# Patient Record
Sex: Male | Born: 1955 | Race: White | Hispanic: No | Marital: Married | Smoking: Former smoker
Health system: Southern US, Community
[De-identification: ages and names within clinical notes are randomized; demographics above are authoritative.]

## PROBLEM LIST (undated history)

## (undated) DIAGNOSIS — K219 Gastro-esophageal reflux disease without esophagitis: Secondary | ICD-10-CM

## (undated) DIAGNOSIS — I959 Hypotension, unspecified: Secondary | ICD-10-CM

## (undated) DIAGNOSIS — R55 Syncope and collapse: Secondary | ICD-10-CM

## (undated) DIAGNOSIS — Z950 Presence of cardiac pacemaker: Secondary | ICD-10-CM

## (undated) DIAGNOSIS — K2270 Barrett's esophagus without dysplasia: Secondary | ICD-10-CM

## (undated) DIAGNOSIS — T7840XA Allergy, unspecified, initial encounter: Secondary | ICD-10-CM

## (undated) DIAGNOSIS — E7800 Pure hypercholesterolemia, unspecified: Secondary | ICD-10-CM

## (undated) DIAGNOSIS — M1990 Unspecified osteoarthritis, unspecified site: Secondary | ICD-10-CM

## (undated) HISTORY — PX: POLYPECTOMY: SHX149

## (undated) HISTORY — DX: Hypotension, unspecified: I95.9

## (undated) HISTORY — DX: Unspecified osteoarthritis, unspecified site: M19.90

## (undated) HISTORY — DX: Gastro-esophageal reflux disease without esophagitis: K21.9

## (undated) HISTORY — PX: COLONOSCOPY: SHX174

## (undated) HISTORY — PX: BLADDER SURGERY: SHX569

## (undated) HISTORY — PX: WISDOM TOOTH EXTRACTION: SHX21

## (undated) HISTORY — PX: UPPER GASTROINTESTINAL ENDOSCOPY: SHX188

## (undated) HISTORY — DX: Barrett's esophagus without dysplasia: K22.70

## (undated) HISTORY — PX: CERVICAL SPINE SURGERY: SHX589

## (undated) HISTORY — PX: OTHER SURGICAL HISTORY: SHX169

## (undated) HISTORY — DX: Syncope and collapse: R55

## (undated) HISTORY — PX: TONSILLECTOMY: SUR1361

## (undated) HISTORY — DX: Allergy, unspecified, initial encounter: T78.40XA

---

## 2003-10-23 ENCOUNTER — Other Ambulatory Visit: Admission: RE | Admit: 2003-10-23 | Discharge: 2003-10-23 | Payer: Self-pay | Admitting: Dermatology

## 2005-11-12 ENCOUNTER — Emergency Department (HOSPITAL_COMMUNITY): Admission: EM | Admit: 2005-11-12 | Discharge: 2005-11-12 | Payer: Self-pay | Admitting: Emergency Medicine

## 2005-11-29 ENCOUNTER — Observation Stay (HOSPITAL_COMMUNITY): Admission: RE | Admit: 2005-11-29 | Discharge: 2005-11-30 | Payer: Self-pay | Admitting: Neurosurgery

## 2006-09-15 ENCOUNTER — Observation Stay (HOSPITAL_COMMUNITY): Admission: EM | Admit: 2006-09-15 | Discharge: 2006-09-16 | Payer: Self-pay | Admitting: Emergency Medicine

## 2006-09-15 HISTORY — PX: CARDIAC CATHETERIZATION: SHX172

## 2006-10-02 HISTORY — PX: OTHER SURGICAL HISTORY: SHX169

## 2009-11-05 ENCOUNTER — Encounter (INDEPENDENT_AMBULATORY_CARE_PROVIDER_SITE_OTHER): Payer: Self-pay | Admitting: *Deleted

## 2009-11-09 ENCOUNTER — Ambulatory Visit: Payer: Self-pay | Admitting: Gastroenterology

## 2009-11-09 ENCOUNTER — Encounter (INDEPENDENT_AMBULATORY_CARE_PROVIDER_SITE_OTHER): Payer: Self-pay | Admitting: *Deleted

## 2009-11-16 ENCOUNTER — Ambulatory Visit: Payer: Self-pay | Admitting: Gastroenterology

## 2009-11-18 ENCOUNTER — Encounter: Payer: Self-pay | Admitting: Gastroenterology

## 2010-01-28 ENCOUNTER — Ambulatory Visit: Payer: Self-pay | Admitting: Otolaryngology

## 2010-02-12 ENCOUNTER — Ambulatory Visit (HOSPITAL_COMMUNITY)
Admission: RE | Admit: 2010-02-12 | Discharge: 2010-02-12 | Payer: Self-pay | Source: Home / Self Care | Attending: Otolaryngology | Admitting: Otolaryngology

## 2010-03-09 NOTE — Letter (Signed)
Summary: [facility] Instructions  [facility]  [address] [place], [place] [address]   Phone: [phone]  Fax: [phone]       [name]    Mar 15, 1955    MRN: [id number]        Procedure Day /Date:  Monday 11/16/2009     Arrival Time:  8:00 am      Procedure Time: 9:00 am     Location of Procedure:                    _ x_  [facility] (4th Floor)                        PREPARATION FOR COLONOSCOPY WITH MOVIPREP   Starting 5 days prior to your procedure Wednesday 10/5 do not eat nuts, seeds, popcorn, corn, beans, peas,  salads, or any raw vegetables.  Do not take any fiber supplements (e.g. Metamucil, Citrucel, and Benefiber).  THE DAY BEFORE YOUR PROCEDURE         DATE: Sunday 10/9  1.  Drink clear liquids the entire day-NO SOLID FOOD  2.  Do not drink anything colored red or purple.  Avoid juices with pulp.  No orange juice.  3.  Drink at least 64 oz. (8 glasses) of fluid/clear liquids during the day to prevent dehydration and help the prep work efficiently.  CLEAR LIQUIDS INCLUDE: Water Jello Ice Popsicles Tea (sugar ok, no milk/cream) Powdered fruit flavored drinks Coffee (sugar ok, no milk/cream) Gatorade Juice: apple, white grape, white cranberry  Lemonade Clear bullion, consomm, broth Carbonated beverages (any kind) Strained chicken noodle soup Hard Candy                             4.  In the morning, mix first dose of MoviPrep solution:    Empty 1 Pouch A and 1 Pouch B into the disposable container    Add lukewarm drinking water to the top line of the container. Mix to dissolve    Refrigerate (mixed solution should be used within 24 hrs)  5.  Begin drinking the prep at 5:00 p.m. The MoviPrep container is divided by 4 marks.   Every 15 minutes drink the solution down to the next mark (approximately 8 oz) until the full liter is complete.   6.  Follow completed prep with 16 oz of clear liquid of your choice (Nothing  red or purple).  Continue to drink clear liquids until bedtime.  7.  Before going to bed, mix second dose of MoviPrep solution:    Empty 1 Pouch A and 1 Pouch B into the disposable container    Add lukewarm drinking water to the top line of the container. Mix to dissolve    Refrigerate  THE DAY OF YOUR PROCEDURE      DATE: Monday 10/10  Beginning at 4:00 a.m. (5 hours before procedure):         1. Every 15 minutes, drink the solution down to the next mark (approx 8 oz) until the full liter is complete.  2. Follow completed prep with 16 oz. of clear liquid of your choice.    3. You may drink clear liquids until 7:00 am (2 HOURS BEFORE PROCEDURE).   MEDICATION INSTRUCTIONS  Unless otherwise instructed, you should take regular prescription medications with a small sip of water   as early as possible the morning  of your procedure.           OTHER INSTRUCTIONS  You will need a responsible adult at least 53 years of age to accompany you and drive you home.   This person must remain in the waiting room during your procedure.  Wear loose fitting clothing that is easily removed.  Leave jewelry and other valuables at home.  However, you may wish to bring a book to read or  an iPod/MP3 player to listen to music as you wait for your procedure to start.  Remove all body piercing jewelry and leave at home.  Total time from sign-in until discharge is approximately 2-3 hours.  You should go home directly after your procedure and rest.  You can resume normal activities the  day after your procedure.  The day of your procedure you should not:   Drive   Make legal decisions   Operate machinery   Drink alcohol   Return to work  You will receive specific instructions about eating, activities and medications before you leave.    The above instructions have been reviewed and explained to me by   Ezra Sites RN  November 09, 2009 1:07 PM     I fully understand and can  verbalize these instructions _____________________________ Date _________

## 2010-03-09 NOTE — Procedures (Signed)
Summary: Colonoscopy  Patient: [name] Note: All result statuses are Final unless otherwise noted.  Tests: (1) Colonoscopy (COL)   COL Colonoscopy           DONE     [facility]     [address]     [place], [place]  [address]           COLONOSCOPY PROCEDURE REPORT           PATIENT:  [name], [name]  MR#:  [id number]     BIRTHDATE:  12/21/1955, 54 yrs. old  GENDER:  male     ENDOSCOPIST:  Rachael Fee, MD     REF. BY:  Lilyan Punt, M.D.     PROCEDURE DATE:  11/16/2009     PROCEDURE:  Colonoscopy with snare polypectomy     ASA CLASS:  Class II     INDICATIONS:  Routine Risk Screening     MEDICATIONS:   Fentanyl 50 mcg IV, Versed 6 mg IV           DESCRIPTION OF PROCEDURE:   After the risks benefits and     alternatives of the procedure were thoroughly explained, informed     consent was obtained.  Digital rectal exam was performed and     revealed no rectal masses.   The LB [id number]-[id number] endoscope     was introduced through the anus and advanced to the terminal ileum     which was intubated for a short distance, without limitations.     The quality of the prep was excellent, using MoviPrep.  The     instrument was then slowly withdrawn as the colon was fully     examined.     <<PROCEDUREIMAGES>>           FINDINGS:  A diminutive polyp was found in the ascending colon.     This was 2-48mm across, removed with cold snare and sent to     pathology (jar 1) (see image4).  Mild diverticulosis was found in     the sigmoid to descending colon segments (see image5).  The     terminal ileum appeared normal (see image3).  This was otherwise a     normal examination of the colon (see image2, image1, and image6).     Retroflexed views in the rectum revealed no abnormalities.    The     scope was then withdrawn from the patient and the procedure     completed.           COMPLICATIONS:  None     ENDOSCOPIC IMPRESSION:     1) Diminutive polyp in the ascending  colon, removed and sent to     pathology     2) Mild diverticulosis in the sigmoid to descending colon     segments     3) Normal terminal ileum     4) Otherwise normal examination           RECOMMENDATIONS:     1) If the polyp(s) removed today are proven to be adenomatous     (pre-cancerous) polyps, you will need a repeat colonoscopy in 5     years. Otherwise you should continue to follow colorectal cancer     screening guidelines for "routine risk" patients with colonoscopy     in 10 years.     2) You will receive a letter within 1-2 weeks with the results     of your biopsy  as well as final recommendations. Please call my     office if you have not received a letter after 3 weeks.           ______________________________     Rachael Fee, MD           n.     eSIGNED:   Rachael Fee at 11/16/2009 09:26 AM           Dahlia Bailiff, [phone]  Note: An exclamation mark (!) indicates a result that was not dispersed into the flowsheet. Document Creation Date: 11/16/2009 9:27 AM _______________________________________________________________________  (1) Order result status: Final Collection or observation date-time: 11/16/2009 09:22 Requested date-time:  Receipt date-time:  Reported date-time:  Referring Physician:   Ordering Physician: Rob Bunting [id number]) Specimen Source:  Source: Launa Grill Order Number: [id number] Lab site:   Appended Document: Colonoscopy     Procedures Next Due Date:    Colonoscopy: 11/2019

## 2010-03-09 NOTE — Letter (Signed)
Summary: Results Letter  [facility]  [address] [place], [place] [address]   Phone: [phone]  Fax: [phone]        November 18, 2009 MRN: [id number]    [facility] [address] [place], [place]  [address]    Dear Mr. Gaiser,   Good news.  The polyp that was removed during your recent procedure was NOT pre-cancerous.  You should continue to follow current colorectal cancer screening guidelines with a repeat colonoscopy in 10 years.  We will therefore put your information in our reminder system and will contact you in 10 years to schedule a repeat procedure.  Please call if you have any questions or concerns.       Sincerely,  Rachael Fee MD  This letter has been electronically signed by your physician.  Appended Document: Results Letter letter mailed

## 2010-03-09 NOTE — Letter (Signed)
Summary: Pre Visit Letter Revised  [facility]  [address] [place], [place] [address]   Phone: [phone]  Fax: [phone]        11/05/2009 MRN: [id number] [facility] [address] [place], [place]  [address]             Procedure Date: 11-30-09   Welcome to the Gastroenterology Division at [facility].    You are scheduled to see a nurse for your pre-procedure visit on 11-16-09 at 3:30p.m. on the 3rd floor at [facility], [address]  We ask that you try to arrive at our office 15 minutes prior to your appointment time to allow for check-in.  Please take a minute to review the attached form.  If you answer "Yes" to one or more of the questions on the first page, we ask that you call the person listed at your earliest opportunity.  If you answer "No" to all of the questions, please complete the rest of the form and bring it to your appointment.    Your nurse visit will consist of discussing your medical and surgical history, your immediate family medical history, and your medications.   If you are unable to list all of your medications on the form, please bring the medication bottles to your appointment and we will list them.  We will need to be aware of both prescribed and over the counter drugs.  We will need to know exact dosage information as well.    Please be prepared to read and sign documents such as consent forms, a financial agreement, and acknowledgement forms.  If necessary, and with your consent, a friend or relative is welcome to sit-in on the nurse visit with you.  Please bring your insurance card so that we may make a copy of it.  If your insurance requires a referral to see a specialist, please bring your referral form from your primary care physician.  No co-pay is required for this nurse visit.     If you cannot keep your appointment, please call [phone] to cancel or reschedule prior to your appointment date.  This  allows [place] the opportunity to schedule an appointment for another patient in need of care.    Thank you for choosing [facility] for your medical needs.  We appreciate the opportunity to care for you.  Please visit [place] at our website  to learn more about our practice.  Sincerely, The Gastroenterology Division

## 2010-03-09 NOTE — Miscellaneous (Signed)
Summary: [facility]  Clinical Lists Changes  Medications: Added new medication of MOVIPREP 100 GM  SOLR (PEG-KCL-NACL-NASULF-NA ASC-C) As per prep instructions. - Signed Rx of MOVIPREP 100 GM  SOLR (PEG-KCL-NACL-NASULF-NA ASC-C) As per prep instructions.;  #1 x 0;  Signed;  Entered by: Ezra Sites RN;  Authorized by: Rachael Fee MD;  Method used: Electronically to [facility] [phone]*, [address] [place], [place], [place]  [address], Ph: [phone] or [phone], Fax: [phone] Allergies: Added new allergy or adverse reaction of LEVAQUIN Added new allergy or adverse reaction of SULFA Added new allergy or adverse reaction of NORVASC Observations: Added new observation of NKA: F (11/09/2009 12:48)    Prescriptions: MOVIPREP 100 GM  SOLR (PEG-KCL-NACL-NASULF-NA ASC-C) As per prep instructions.  #1 x 0   Entered by:   Ezra Sites RN   Authorized by:   Rachael Fee MD   Signed by:   Ezra Sites RN on 11/09/2009   Method used:   Electronically to        [facility] [phone]* (retail)       [address]       [place], [place]  [address]       Ph: [phone] or [phone]       Fax: [phone]   RxID:   [id number]

## 2010-06-06 ENCOUNTER — Emergency Department (HOSPITAL_COMMUNITY)
Admission: EM | Admit: 2010-06-06 | Discharge: 2010-06-07 | Disposition: A | Discharge status: Home / Self Care | Payer: Managed Care, Other (non HMO) | Source: Home / Self Care | Attending: Emergency Medicine | Admitting: Emergency Medicine

## 2010-06-06 ENCOUNTER — Emergency Department (HOSPITAL_COMMUNITY): Payer: Managed Care, Other (non HMO)

## 2010-06-06 DIAGNOSIS — M1990 Unspecified osteoarthritis, unspecified site: Secondary | ICD-10-CM | POA: Insufficient documentation

## 2010-06-06 DIAGNOSIS — R079 Chest pain, unspecified: Secondary | ICD-10-CM | POA: Insufficient documentation

## 2010-06-06 DIAGNOSIS — I10 Essential (primary) hypertension: Secondary | ICD-10-CM | POA: Insufficient documentation

## 2010-06-06 DIAGNOSIS — I498 Other specified cardiac arrhythmias: Secondary | ICD-10-CM | POA: Insufficient documentation

## 2010-06-06 DIAGNOSIS — E7800 Pure hypercholesterolemia, unspecified: Secondary | ICD-10-CM | POA: Insufficient documentation

## 2010-06-06 DIAGNOSIS — Z79899 Other long term (current) drug therapy: Secondary | ICD-10-CM | POA: Insufficient documentation

## 2010-06-06 DIAGNOSIS — K219 Gastro-esophageal reflux disease without esophagitis: Secondary | ICD-10-CM | POA: Insufficient documentation

## 2010-06-06 LAB — CBC
Hemoglobin: 14.6 g/dL (ref 13.0–17.0)
MCH: 32.1 pg (ref 26.0–34.0)
MCHC: 35.5 g/dL (ref 30.0–36.0)
Platelets: 155 10*3/uL (ref 150–400)

## 2010-06-06 LAB — DIFFERENTIAL
Basophils Absolute: 0 10*3/uL (ref 0.0–0.1)
Basophils Relative: 0 % (ref 0–1)
Eosinophils Absolute: 0.1 10*3/uL (ref 0.0–0.7)
Monocytes Absolute: 0.4 10*3/uL (ref 0.1–1.0)
Neutro Abs: 4 10*3/uL (ref 1.7–7.7)
Neutrophils Relative %: 52 % (ref 43–77)

## 2010-06-06 LAB — BASIC METABOLIC PANEL
CO2: 23 mEq/L (ref 19–32)
Calcium: 9.6 mg/dL (ref 8.4–10.5)
Creatinine, Ser: 1.08 mg/dL (ref 0.4–1.5)
GFR calc Af Amer: >60 mL/min (ref >60)
GFR calc non Af Amer: >60 mL/min (ref >60)
Sodium: 139 mEq/L (ref 135–145)

## 2010-06-06 LAB — POCT CARDIAC MARKERS
CKMB, poc: 2.3 ng/mL (ref 1.0–8.0)
Myoglobin, poc: 121 ng/mL (ref 12–200)

## 2010-06-07 ENCOUNTER — Inpatient Hospital Stay (HOSPITAL_COMMUNITY)
Admission: EM | Admit: 2010-06-07 | Discharge: 2010-06-09 | DRG: 244 | Disposition: A | Discharge status: Home / Self Care | Payer: Managed Care, Other (non HMO) | Source: Other Acute Inpatient Hospital | Attending: Cardiovascular Disease | Admitting: Cardiovascular Disease

## 2010-06-07 DIAGNOSIS — I495 Sick sinus syndrome: Principal | ICD-10-CM | POA: Diagnosis present

## 2010-06-07 DIAGNOSIS — E785 Hyperlipidemia, unspecified: Secondary | ICD-10-CM | POA: Diagnosis present

## 2010-06-07 DIAGNOSIS — R5381 Other malaise: Secondary | ICD-10-CM | POA: Diagnosis present

## 2010-06-07 DIAGNOSIS — I2510 Atherosclerotic heart disease of native coronary artery without angina pectoris: Secondary | ICD-10-CM | POA: Diagnosis present

## 2010-06-07 LAB — CBC
HCT: 35.8 % — ABNORMAL LOW (ref 39.0–52.0)
MCH: 32.3 pg (ref 26.0–34.0)
MCHC: 36 g/dL (ref 30.0–36.0)
MCV: 89.5 fL (ref 78.0–100.0)
Platelets: 142 10*3/uL — ABNORMAL LOW (ref 150–400)
RDW: 12.6 % (ref 11.5–15.5)
WBC: 7.7 10*3/uL (ref 4.0–10.5)

## 2010-06-07 LAB — COMPREHENSIVE METABOLIC PANEL
ALT: 19 U/L (ref 0–53)
Alkaline Phosphatase: 50 U/L (ref 39–117)
BUN: 19 mg/dL (ref 6–23)
CO2: 25 mEq/L (ref 19–32)
Chloride: 109 mEq/L (ref 96–112)
Glucose, Bld: 100 mg/dL — ABNORMAL HIGH (ref 70–99)
Potassium: 4.4 mEq/L (ref 3.5–5.1)
Sodium: 139 mEq/L (ref 135–145)
Total Bilirubin: 0.7 mg/dL (ref 0.3–1.2)
Total Protein: 6 g/dL (ref 6.0–8.3)

## 2010-06-07 LAB — POCT CARDIAC MARKERS
CKMB, poc: 3.3 ng/mL (ref 1.0–8.0)
Myoglobin, poc: 75.6 ng/mL (ref 12–200)

## 2010-06-07 LAB — MAGNESIUM: Magnesium: 2.2 mg/dL (ref 1.5–2.5)

## 2010-06-07 LAB — CARDIAC PANEL(CRET KIN+CKTOT+MB+TROPI)
CK, MB: 3.6 ng/mL (ref 0.3–4.0)
Relative Index: 1.7 (ref 0.0–2.5)
Relative Index: 2.1 (ref 0.0–2.5)
Total CK: 195 U/L (ref 7–232)
Total CK: 152 U/L (ref 7–232)
Troponin I: 0.02 ng/mL (ref 0.00–0.06)
Troponin I: 0.01 ng/mL (ref 0.00–0.06)

## 2010-06-07 LAB — HEMOGLOBIN A1C: Hgb A1c MFr Bld: 5.1 % (ref <5.7)

## 2010-06-07 LAB — TSH: TSH: 1.086 u[IU]/mL (ref 0.350–4.500)

## 2010-06-07 LAB — PROTIME-INR: Prothrombin Time: 13.8 seconds (ref 11.6–15.2)

## 2010-06-07 LAB — HEPARIN LEVEL (UNFRACTIONATED)
Heparin Unfractionated: 0.64 IU/mL (ref 0.30–0.70)
Heparin Unfractionated: <0.1 IU/mL — ABNORMAL LOW (ref 0.30–0.70)

## 2010-06-08 HISTORY — PX: PACEMAKER INSERTION: SHX728

## 2010-06-08 LAB — BASIC METABOLIC PANEL
BUN: 15 mg/dL (ref 6–23)
Chloride: 109 mEq/L (ref 96–112)
GFR calc non Af Amer: >60 mL/min (ref >60)
Potassium: 4.1 mEq/L (ref 3.5–5.1)
Sodium: 141 mEq/L (ref 135–145)

## 2010-06-08 LAB — CBC
HCT: 37.1 % — ABNORMAL LOW (ref 39.0–52.0)
MCV: 91.2 fL (ref 78.0–100.0)
Platelets: 125 10*3/uL — ABNORMAL LOW (ref 150–400)
RBC: 4.07 MIL/uL — ABNORMAL LOW (ref 4.22–5.81)
RDW: 12.7 % (ref 11.5–15.5)
WBC: 6.3 10*3/uL (ref 4.0–10.5)

## 2010-06-09 ENCOUNTER — Inpatient Hospital Stay (HOSPITAL_COMMUNITY): Payer: Managed Care, Other (non HMO)

## 2010-06-17 NOTE — H&P (Signed)
[name], [name]                ACCOUNT NO.:  [id number]  MEDICAL RECORD NO.:  [id number]           PATIENT TYPE:  I  LOCATION:  2919                         FACILITY:  [facility]  PHYSICIAN:  Thurmon Fair, MD     DATE OF BIRTH:  05-May-1955  DATE OF ADMISSION:  06/07/2010 DATE OF DISCHARGE:                             HISTORY & PHYSICAL   CHIEF COMPLAINT:  Chest pain.  HISTORY OF PRESENT ILLNESS:  [name] is a very pleasant 55 year old white male with a history of hypertension, dyslipidemia, and gastroesophageal reflux disease who presents to the emergency department at [facility] with complaints of chest pain.  He states that over the last several weeks he had been experiencing some intermittent episodes of left anterior chest pain, which has been very short lived not particularly coming on with exertion or activity and lasting only a few seconds and abating spontaneously.  However, yesterday afternoon, he developed this chest discomfort, which persisted for approximately 20-30 minutes.  He presented for evaluation, was given nitrate and morphine with very little improvement of his discomfort.  He was subsequently started on IV nitroglycerin and continued with morphine and Dilaudid for pain.  He reports that he has experienced some shortness of breath associated with this discomfort and has noted some progressive dyspnea on exertion for the last several months.  He has not experienced any orthopnea, PND, or lower extremity edema.  He has had an episode last week where while he was working he experienced some lightheadedness and presyncope.  He thought he would pass out and then his symptoms improved, and he was able to carry on his activities.  He denied any associated nausea or diaphoresis with this chest pain or with this episode of presyncope.  On arrival from [facility], his EKG reveals marked sinus bradycardia with a ventricular rate of 33 beats  per minute; however, there are no acute ischemic changes noted.  His point- of-care markers at [facility] have been negative.  Currently, he is pain- free.  PAST MEDICAL HISTORY: 1. Chest pain.     a.     Cardiac catheterization in 2008 revealed normal coronary      arteries. 2. Normal LV function. 3. Hypertension. 4. Dyslipidemia. 5. GERD. 6. Arthritis. 7. Degenerative joint disease status post neck surgery.  FAMILY HISTORY:  Positive for coronary artery disease.  His father had his first MI in his 71s.  SOCIAL HISTORY:  He is married.  Denies any tobacco or alcohol.  No drug use.  ALLERGIES:  LEVAQUIN causes hives, NORVASC and SULFA causes difficulty breathing.  CURRENT MEDICATIONS:  Omeprazole, niacin, and fish oil.  REVIEW OF SYSTEMS:  As per HPI, otherwise negative.  PHYSICAL EXAMINATION:  VITAL SIGNS:  Blood pressure is 108/74, pulse is 34 and regular, respirations 18, pulse ox is 100% on 2 liters. GENERAL:  This is a pleasant 55 year old white male in no acute distress. HEENT:  Pupils are equal and reactive to light and accommodation. Extraocular movements are intact. NECK:  Supple.  No JVD.  No carotid bruits or thyromegaly. CARDIOVASCULAR:  Regular rate and  rhythm.  S1 and S2.  He is bradycardic.  No murmur, gallop, rub. LUNGS:  Clear to auscultation bilaterally with normal respiratory effort. ABDOMEN:  Soft, nontender with hepatosplenomegaly or masses. EXTREMITIES:  Radial, femoral, dorsal pedal arteries present without lower extremity edema.  No clubbing, cyanosis, or ulcers. NEUROLOGIC:  Oriented to person, place, and time.  Normal mood and affect.  LABORATORY DATA:  EKG reveals sinus bradycardia.  BMET is normal with a glucose of 111, BUN 19, creatinine 1.08.  CBC is normal.  Point-of-care markers are negative.  Chest x-ray reveals mild cardiomegaly, unchanged to prior exam.  No acute cardiopulmonary disease.  IMPRESSION: 1. Chest pain. 2. Normal  coronary arteries in 2008. 3. Hypertension. 4. Dyslipidemia. 5. Bradycardia. 6. Gastroesophageal reflux disease. 7. Degenerative joint disease. 8. Arthritis.  PLAN:  We will keep him n.p.o. this morning for possible cardiac catheterization.  We will check a TSH, hemoglobin A1c, magnesium, and continue to cycle his enzymes.  His IV nitro is off currently, and he is comfortable, but we will restart that with any recurrent chest discomfort.  We will hold any beta-blocker therapy as he is significantly bradycardic.  We will place a Zoll at the bedside and atropine as well for any symptomatic bradycardia that may arise.    ______________________________ Rea [employer], NP   ______________________________ Thurmon Fair, MD    LS/MEDQ  D:  06/07/2010  T:  06/07/2010  Job:  [id number]  cc:   Lorin Picket A. Gerda Diss, MD [facility]  Electronically Signed by Charmian Muff NP on 06/10/2010 05:27:36 PM Electronically Signed by Thurmon Fair M.D. on 06/17/2010 04:07:47 PM

## 2010-06-17 NOTE — Discharge Summary (Signed)
  NAMESTERLING, [name]                ACCOUNT NO.:  [id number]  MEDICAL RECORD NO.:  [id number]           PATIENT TYPE:  I  LOCATION:  2006                         FACILITY:  [facility]  PHYSICIAN:  Thurmon Fair, MD     DATE OF BIRTH:  06/14/1955  DATE OF ADMISSION:  06/07/2010 DATE OF DISCHARGE:  06/09/2010                              DISCHARGE SUMMARY   DISCHARGE DIAGNOSES: 1. Chest pain, minor coronary artery disease at catheterization, this     is the patient's second catheterization. 2. Sinus bradycardia. 3. Fatigue, status post pacemaker implant this admission. 4. Treated dyslipidemia.  HOSPITAL COURSE:  The patient is a 55 year old male with dyslipidemia who has had previous catheterization in 2008 showing no significant coronary artery disease.  He presented with chest pain worrisome for unstable angina.  His last catheterization was in 2008.  He is a nonsmoker, he does have a family history of coronary artery disease with his father having an MI in his 75s.  He was admitted to telemetry and ruled out for an MI.  He was set up for diagnostic catheterization showing minor coronary artery disease with a less than 20% distal RCA narrowing.  EF was 55%.  The patient did have documented bradycardia during his admission with rates in the low 30s.  He admits to some fatigue.  He underwent elective pacemaker implant on Jun 08, 2010, by Dr. Royann Shivers, he has a Medtronic device in place.  Chest x-ray shows no pneumothorax.  His device function is normal at discharge.  We feel that he can be discharged and can follow up with Dr. Royann Shivers in a week in [place].  LABORATORY DATA:  TSH is 1.08.  CK-MB and troponins were negative x3. Hemoglobin A1c is 5.1.  Sodium 139, potassium 4.4, BUN 19, creatinine 1.04.  Liver functions were normal.  INR 1.04, white count 7.7, hemoglobin 12.9, hematocrit 35.8, platelets 142.  An EKG shows sinus bradycardia with low rate of 33.  DISPOSITION:  The  patient is discharged in stable condition.  He is paced at discharge.  He will follow up with Dr. Royann Shivers in [place]. Please see med rec for complete discharge medications.     Abelino Derrick, P.A.   ______________________________ Thurmon Fair, MD    LKK/MEDQ  D:  06/09/2010  T:  06/09/2010  Job:  [id number]  cc:   Lorin Picket A. Gerda Diss, MD  Electronically Signed by Corine Shelter P.A. on 06/11/2010 05:31:11 PM Electronically Signed by Thurmon Fair M.D. on 06/17/2010 04:07:52 PM

## 2010-06-17 NOTE — Op Note (Signed)
[name], [name]                ACCOUNT NO.:  [id number]  MEDICAL RECORD NO.:  [id number]           PATIENT TYPE:  I  LOCATION:  2006                         FACILITY:  [facility]  PHYSICIAN:  Thurmon Fair, MD     DATE OF BIRTH:  05/11/1955  DATE OF PROCEDURE: DATE OF DISCHARGE:                              OPERATIVE REPORT   PROCEDURES PERFORMED: 1. Implantation of new dual-chamber permanent pacemaker. 2. Fluoroscopy. 3. Moderate sedation.  REASON FOR THE PROCEDURE:  Symptomatic bradycardia with sinus node dysfunction.  OPERATOR:  Thurmon Fair, MD  ASSISTANT:  Oliver Hum, RCIS  COMPLICATIONS:  None.  ESTIMATED BLOOD LOSS:  Less than 10 mL.  MEDICATIONS ADMINISTERED:  Ancef 1 g intravenously, lidocaine 1% 25 mL locally, Versed 4 mg intravenously, and fentanyl 75 mcg intravenously.  DEVICE DETAILS:  Implanted generator is a Medtronic MRI safe Revo, model number RVDRO 1, serial number [id number].  The ventricular lead is a Medtronic 5086 MRI safe 58-cm lead, serial number [id number].  The atrial lead is Medtronic 5086 MRI safe 52-cm lead, serial number [id number].  DETAILS OF PROCEDURE:  After risks and benefits of procedure were described, the patient provided informed consent and was brought to the cardiac cath lab in a fasting state.  The left chest area was prepped and draped in usual sterile fashion.  Local anesthesia 1% lidocaine was administered to the left infraclavicular area.  A 5-6 cm horizontal incision was made parallel to the inferior border of the left clavicle roughly 2 cm inferior to it.  Using electrocautery and blunt dissection, a prepectoral pocket was created making  sure to achieve good hemostasis.  An antibiotic soaked sponge was placed in the pocket.  Under fluoroscopic guidance and using the modified Seldinger technique, two separate 8-French sheaths were introduced via two separate venipunctures.  Under fluoroscopic guidance,  the right ventricular lead was advanced to the level of the right ventricular apex.  Several positions were attempted before satisfactory electronic parameters were achieved. After this, the SafeSheath was peeled away and the lead was secured in place using 2-0 silk.  The lead is an active fixation lead.  Prominent current of injury was seen.  Pacing at maximum device output did not produce any diaphragmatic/phrenic nerve stimulation.  Sensing and capture thresholds were satisfactory.  In a similar fashion, the right atrial lead was advanced to the level of the right atrial free wall.  The two previous attempts at placing the lead in the right atrial appendage were associated very high capture thresholds.  The free wall position offered much better electrical values.  There was very prominent current of injury, and there was no evidence of diaphragmatic/phrenic nerve stimulation at maximum device output.  Good sensing and pacing threshold were obtained.  The SafeSheath was peeled away and the lead was secured in place using 2-0 silk.  The generator was then attached to the leads.  Appropriate ventricular and subsequently sequential atrioventricular pacing were noted.  The device was then placed in the pocket with great care being taken that the leads be located deep to the  generator.  The pocket was then closed in layers using two layers of 2-0 Vicryl and a layer of cutaneous staples.  A sterile dressing was then applied.  At the end of the procedure,  the following electrical parameters were encountered.  Atrial lead sensed P-wave 3.5 mV, impedance 860 ohms, threshold 0.9 volts at 0.5 milliseconds pulse width.  Right ventricular lead sensed R-waves 9.6 mV, impedance 1135 ohms, threshold 0.4 volts at 0.5 milliseconds pulse width.     Thurmon Fair, MD     MC/MEDQ  D:  06/08/2010  T:  06/09/2010  Job:  [id number]  cc:   [facility] Scott A. Gerda Diss,  MD  Electronically Signed by Thurmon Fair M.D. on 06/17/2010 04:07:50 PM

## 2010-06-22 NOTE — Cardiovascular Report (Signed)
NAMEGEORGIA, [name]                ACCOUNT NO.:  [id number]   MEDICAL RECORD NO.:  [id number]          PATIENT TYPE:  OBV   LOCATION:  4705                         FACILITY:  [facility]   PHYSICIAN:  Nanetta Batty, M.D.   DATE OF BIRTH:  02/17/1955   DATE OF PROCEDURE:  DATE OF DISCHARGE:  09/16/2006                            CARDIAC CATHETERIZATION   [name] is of 55 year old white male admitted last night with  unstable angina.  He has a history of hypertension and a family history  of heart disease.  He ruled out for myocardial infarction.  His EKG  shows no acute changes.  He was placed on heparin and nitro.  He  presents now for diagnostic coronary arteriography to define his  anatomy, rule out ischemic etiology.   DESCRIPTION OF PROCEDURE:  The patient was brought to the second floor  [facility] cath lab in the postabsorptive state.  His right groin was  prepped and shaved in the usual sterile fashion.  One percent Xylocaine  was used for local anesthesia.  A 6-French sheath was inserted into the  right femoral artery, using standard Seldinger technique.  A 6-French  right and left Judkins' diagnostic catheter, as well as 6 French pigtail  catheter were used for selective cholangiography, left ventriculography  and supravalvular aortography to rule out aortic dissection.  Visipaque  dye was used for the entirety of the case.  Retrograde aortic,  ventricular blood pressures were recorded.   HEMODYNAMIC RESULTS:  1. Aortic systolic pressure 109, diastolic pressure 74.  2. Left ventricular systolic pressure 111, end-diastolic pressure 13.   SELECTIVE CHOLANGIOGRAPHY:  1. Left main normal.  2. LAD normal.  3. Left circumflex non-dominant, normal.  4. Ramus branch was normal.  5. Right coronary artery was dominant and normal.  It should be noted      there was slow flow in the coronary arteries, probably as related      to bradycardia.  There was also some streaming effect  at the crux      of the vessel in the right coronary artery.   IMPRESSION:  Left ventriculography; RAO left ventriculogram was  performed using 25 mL of Visipaque dye at 12 mL per second.  The overall  LVEF was estimated greater 60%, without focal wall motion abnormalities.   IMPRESSION:  [name] has essentially normal coronaries arteries,  normal LV function.  I also performed, supravalvular aortography in the  LAO view revealing no AI, no aortic dissection and patent arch vessels,  ruling out aortic dissection as the etiology.  I believe his chest is  noncardiac.  Empiric anti-reflux therapy will be recommended.   The sheath was removed, and pressure was held to the groin to achieve  hemostasis.  The patient left the lab in stable condition.  He will be  discharged home later tonight and will see me back in the office next  week in followup.  We will place him on proton pump, in addition.      Nanetta Batty, M.D.  Electronically Signed     JB/MEDQ  D:  09/15/2006  T:  09/16/2006  Job:  [id number]   cc:   Patient Chart  [facility] - 5th Floor  [facility]  W. Simone Curia, M.D.

## 2010-06-22 NOTE — Discharge Summary (Signed)
[name], HOHENSEE                ACCOUNT NO.:  [id number]   MEDICAL RECORD NO.:  [id number]          PATIENT TYPE:  OBV   LOCATION:  4705                         FACILITY:  [facility]   PHYSICIAN:  Richard A. Alanda Amass, M.D.DATE OF BIRTH:  1955/05/25   DATE OF ADMISSION:  09/15/2006  DATE OF DISCHARGE:  09/15/2006                               DISCHARGE SUMMARY   DISCHARGE DIAGNOSES:  1. Chest pain, worrisome for angina, normal coronaries at      catheterization .  2. Dyspnea, unclear etiology, normal left ventricular function at      catheterization.  3. History of gastroesophageal reflux.  4. History of arthritis.  5. Degenerative joint disease, with prior history of herniated disc,      status post repair.   HOSPITAL COURSE:  The patient is a 55 year old male with a history of  negative cardiac workup 7 or 8 years ago for chest pain and  shortness  of breath.  He was actually scheduled see Dr. Alanda Amass in [place] in  a week or two.  He presented September 15, 2006 with complaints of chest  pain.  The patient complained of some intermittent sharp chest pain.  He  has also said he has had increasing dyspnea for the last few weeks and  some midsternal heaviness.  He says he is short of breath with minimal  exertion.  He was admitted to telemetry and set up for diagnostic  catheterization.  His enzymes were negative.  Catheterization revealed  normal coronaries and normal LV function.  We feel he can be discharged  later on September 15, 2006.  We will go ahead and check a D-dimer today.  If that is positive, then he will probably need to stay for a CT scan to  rule out PE.   LABORATORIES:  INR is 1.1.  Portable chest shows cardiomegaly, without  evidence of acute disease.  TSH 2.38.  BNP is less than 30.  CK-MB and  troponins are negative x3.  Liver functions are normal.  Magnesium is  2.4, sodium 138, potassium 3.6, BUN 16, creatinine 1.  White count 6.1,  hemoglobin 13.9,  hematocrit 38.9, platelets 161.  His EKG reveals sinus  rhythm, sinus bradycardia, without acute changes.   DISPOSITION:  The patient is to be discharged later tonight once his  bedrest is up and he is ambulated without problem.  If his D-dimer is  elevated, he will need to stay for CT scan of his chest to rule out PE.  We suggested he keep his appointment Dr. Alanda Amass in 2 weeks in  [place].      Abelino Derrick, P.A.      Richard A. Alanda Amass, M.D.  Electronically Signed    LKK/MEDQ  D:  09/15/2006  T:  09/16/2006  Job:  [id number]   cc:   Lorin Picket A. Gerda Diss, MD

## 2010-06-25 NOTE — Op Note (Signed)
[name], [name]                ACCOUNT NO.:  [id number]   MEDICAL RECORD NO.:  [id number]          PATIENT TYPE:  INP   LOCATION:  3005                         FACILITY:  [facility]   PHYSICIAN:  Clydene Fake, M.D.  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   POSTOPERATIVE DIAGNOSIS:  Herniated nucleus pulposus, spondylosis, C5-6 and  6-7, with right-sided radiculopathy.   PROCEDURE:  Anterior cervical decompression, diskectomy and fusion of C5-6  and 6-7 with LifeNet allograft bone, Eagle anterior cervical plate.   SURGEON:  Clydene Fake, MD   ASSISTANT:  Danae Orleans. Venetia Maxon, MD   ANESTHESIA:  General endotracheal tube.   ESTIMATED BLOOD LOSS:  Minimal.   BLOOD GIVEN:  None.   DRAINS:  None.   COMPLICATIONS:  None.   REASON FOR PROCEDURE:  The patient is a 55 year old gentleman with neck and  right arm pain, numbness and weakness.  Found to have right triceps weakness  at 4+/5, finger extension weakness at 5-/5, sensation diminished in the  right C6 and 7 distributions.  MRI was done showing spondylitic changes,  biforaminal narrowing at 5-6, and very large disk herniation on the right  side of C6-7, causing canal stenosis and foraminal nerve root compression  there on the right side, and patient brought in for decompression and  fusion.   PROCEDURE IN DETAIL:  The patient was brought into the operating room.  General anesthesia was induced.  The patient was placed in 10 pounds Holter  traction, prepped and draped in a sterile fashion.  Skin incision was  injected with 10 mL of 1% lidocaine with epinephrine.  Incision was then  made from the midline to the anterior border of the sternocleidomastoid  muscle on the left side and neck incision taken down to the platysma,  and  hemostasis was obtained with Bovie cauterization.  The platysma was incised  with a Bovie and blunt dissection taken through the anterior cervical fascia  to the anterior cervical spine.  Needle was placed in the interspace.  X-  rays were obtained, showing this in the 5-6 interspace.  Disk space was  incised with a 15-blade and partial diskectomy performed with pituitary  rongeurs.  As the needle was removed, longus colli muscles were reflected  laterally using the Bovie from C5-7.  A self-retaining retractor was placed.  Anterior osteophytes were removed with Leksell rongeurs and the Kerrison  punches, and both the disk spaces were incised with a 15-blade and  diskectomy continued with pituitary rongeurs at both the 5-6 and 6-7 level.  Distraction pins were placed in the C5 and C7 in the interspaces, and the  disk spaces were distracted.  Microscope was brought in for microdissection  at this point and started at the 6-7 level.  Curettes and pituitary rongeurs  were used to continue the diskectomy and 1 and 2-mm Kerrison punches were  then used  to removed posterior disk, posterior ligaments, and posterior  osteophytes.  Large extruded fragments were seen central to the right side  at the 6-7 level and these were removed, decompressing the central canal.  Bilateral foraminotomies were then performed with more free fragment of disk  found out in the right foramen.  When it appeared we had good central  decompression, the lateral nerve roots were decompressed and we had an  extensive foraminotomy over on the right side.  Hemostasis was obtained with  Gelfoam and thrombin.  We removed cartilaginous endplate with curettes.  We  measured the height of the disk space to be 6 mm.  Attention was then taken  to the 5-6 level.  Again, a diskectomy was continued with curettes,  pituitary rongeurs, and 1 and 2-mm Kerrison punches were used to remove  posterior disk, posterior ligaments, and  posterior osteophytes, and central  decompression of bilateral foraminotomies were performed.  There was  definitely a more spondylitic change to the right foramen.  When we were  finished, we had good central and lateral decompression with the nerve roots  decompressed and going out the foramen well.  We again removed cartilaginous  endplate with curettes, measured the disk space to be 5 mm.  We had good  hemostasis with Gelfoam and thrombin at the C6-7 level.  Gelfoam was  irrigated out.  We had good hemostasis, and the 6-mm LifeNet allograft bone  was tapped into place, countersunk 1 mm or so.  We had plenty of room  between gone graft and dura in checking it with the nerve hook.  Attention  then taken to 5-6.  Again, we irrigated with antibiotic solution, removed  all Gelfoam and had good hemostasis, and a 5-mm LifeNet allograft bone graft  was tapped into place.  The weight was removed from the traction.  We had  distraction in this direction.  Pins were removed.  Hemostasis obtained with  Gelfoam and thrombin.  Bone was firmly in place at both positions, 5-6 and 6-  7.  An Eagle anterior cervical plate was placed over the anterior cervical  spine; 2 screws were placed in the C5, 2 in the C6, 2 in the C7.  These were  tightened down.  Lateral x-rays were obtained showing good position of the  plate and screws, interbody bone plugs, 5-6, 6-7.  Traction was removed.  Hemostasis obtained with bipolar cauterization.  We irrigated with  antibiotic solution.  We had very good hemostasis, and the platysma was  closed with 3-0 Vicryl interrupted sutures.  Subcutaneous tissue closed with  the same.  Skin closed with benzoin and Steri-Strips.  Dressing was placed.  The patient was placed in a soft cervical collar, awoken from anesthesia,  and transferred to the recovery room in stable condition.           ______________________________  Clydene Fake, M.D.    JRH/MEDQ  D:  11/29/2005   T:  11/30/2005  Job:  [id number]

## 2010-11-22 LAB — POCT CARDIAC MARKERS
CKMB, poc: 1.4
CKMB, poc: 1.6
Myoglobin, poc: 93.8
Operator id: 277751
Troponin i, poc: <0.05
Troponin i, poc: <0.05

## 2010-11-22 LAB — I-STAT 8, (EC8 V) (CONVERTED LAB)
BUN: 16
Bicarbonate: 25.1 — ABNORMAL HIGH
Glucose, Bld: 124 — ABNORMAL HIGH
TCO2: 26
pCO2, Ven: 37.5 — ABNORMAL LOW
pH, Ven: 7.434 — ABNORMAL HIGH

## 2010-11-22 LAB — COMPREHENSIVE METABOLIC PANEL
ALT: 31
Albumin: 3.7
Calcium: 9.2
GFR calc Af Amer: >60
Glucose, Bld: 99
Potassium: 3.6
Sodium: 138
Total Protein: 6.1

## 2010-11-22 LAB — CK TOTAL AND CKMB (NOT AT ARMC)
CK, MB: 2.5
Relative Index: 0.9
Relative Index: 1.4
Total CK: 208
Total CK: 275 — ABNORMAL HIGH

## 2010-11-22 LAB — CBC
Hemoglobin: 13.9
MCHC: 35.7
RBC: 4.23
WBC: 6.1

## 2010-11-22 LAB — PROTIME-INR
INR: 1.1
Prothrombin Time: 14.1

## 2010-11-22 LAB — DIFFERENTIAL
Eosinophils Absolute: 0
Lymphs Abs: 2.5
Monocytes Absolute: 0.4
Monocytes Relative: 6
Neutro Abs: 3.2
Neutrophils Relative %: 53

## 2010-11-22 LAB — APTT: aPTT: 168 — ABNORMAL HIGH

## 2010-11-22 LAB — HEPARIN LEVEL (UNFRACTIONATED): Heparin Unfractionated: 0.12 — ABNORMAL LOW

## 2010-11-22 LAB — D-DIMER, QUANTITATIVE: D-Dimer, Quant: 0.24

## 2010-11-22 LAB — POCT I-STAT CREATININE: Operator id: 277751

## 2010-11-22 LAB — TSH: TSH: 2.338

## 2010-11-22 LAB — TROPONIN I
Troponin I: 0.02
Troponin I: 0.02

## 2011-04-06 ENCOUNTER — Encounter (HOSPITAL_COMMUNITY): Payer: Self-pay | Admitting: *Deleted

## 2011-04-06 ENCOUNTER — Other Ambulatory Visit: Payer: Self-pay

## 2011-04-06 ENCOUNTER — Observation Stay (HOSPITAL_COMMUNITY)
Admission: EM | Admit: 2011-04-06 | Discharge: 2011-04-06 | Disposition: A | Discharge status: Home / Self Care | Payer: Managed Care, Other (non HMO) | Attending: Internal Medicine | Admitting: Internal Medicine

## 2011-04-06 ENCOUNTER — Emergency Department (HOSPITAL_COMMUNITY): Payer: Managed Care, Other (non HMO)

## 2011-04-06 DIAGNOSIS — Z8249 Family history of ischemic heart disease and other diseases of the circulatory system: Secondary | ICD-10-CM

## 2011-04-06 DIAGNOSIS — Z9889 Other specified postprocedural states: Secondary | ICD-10-CM

## 2011-04-06 DIAGNOSIS — Z950 Presence of cardiac pacemaker: Secondary | ICD-10-CM | POA: Insufficient documentation

## 2011-04-06 DIAGNOSIS — R079 Chest pain, unspecified: Secondary | ICD-10-CM | POA: Diagnosis present

## 2011-04-06 DIAGNOSIS — R0789 Other chest pain: Principal | ICD-10-CM | POA: Insufficient documentation

## 2011-04-06 DIAGNOSIS — N289 Disorder of kidney and ureter, unspecified: Secondary | ICD-10-CM | POA: Diagnosis present

## 2011-04-06 DIAGNOSIS — R0602 Shortness of breath: Secondary | ICD-10-CM | POA: Insufficient documentation

## 2011-04-06 DIAGNOSIS — E7800 Pure hypercholesterolemia, unspecified: Secondary | ICD-10-CM | POA: Insufficient documentation

## 2011-04-06 DIAGNOSIS — E860 Dehydration: Secondary | ICD-10-CM | POA: Diagnosis present

## 2011-04-06 DIAGNOSIS — E785 Hyperlipidemia, unspecified: Secondary | ICD-10-CM | POA: Diagnosis present

## 2011-04-06 HISTORY — PX: TRANSTHORACIC ECHOCARDIOGRAM: SHX275

## 2011-04-06 HISTORY — DX: Presence of cardiac pacemaker: Z95.0

## 2011-04-06 HISTORY — DX: Pure hypercholesterolemia, unspecified: E78.00

## 2011-04-06 LAB — CBC
HCT: 40.4 % (ref 39.0–52.0)
Hemoglobin: 14.5 g/dL (ref 13.0–17.0)
Hemoglobin: 14.3 g/dL (ref 13.0–17.0)
MCH: 32.2 pg (ref 26.0–34.0)
MCH: 32.4 pg (ref 26.0–34.0)
MCHC: 35.9 g/dL (ref 30.0–36.0)
MCV: 90.5 fL (ref 78.0–100.0)
Platelets: 163 10*3/uL (ref 150–400)
RBC: 4.41 MIL/uL (ref 4.22–5.81)
RDW: 12.4 % (ref 11.5–15.5)

## 2011-04-06 LAB — CARDIAC PANEL(CRET KIN+CKTOT+MB+TROPI)
CK, MB: 4.6 ng/mL — ABNORMAL HIGH (ref 0.3–4.0)
Relative Index: 1.8 (ref 0.0–2.5)
Relative Index: 2 (ref 0.0–2.5)
Total CK: 227 U/L (ref 7–232)
Troponin I: <0.3 ng/mL (ref <0.30)

## 2011-04-06 LAB — DIFFERENTIAL
Basophils Absolute: 0 10*3/uL (ref 0.0–0.1)
Basophils Relative: 0 % (ref 0–1)
Eosinophils Absolute: 0.1 10*3/uL (ref 0.0–0.7)
Monocytes Absolute: 0.5 10*3/uL (ref 0.1–1.0)
Monocytes Relative: 6 % (ref 3–12)
Neutro Abs: 4.1 10*3/uL (ref 1.7–7.7)

## 2011-04-06 LAB — COMPREHENSIVE METABOLIC PANEL
Albumin: 4 g/dL (ref 3.5–5.2)
BUN: 25 mg/dL — ABNORMAL HIGH (ref 6–23)
Calcium: 10.3 mg/dL (ref 8.4–10.5)
Creatinine, Ser: 1.49 mg/dL — ABNORMAL HIGH (ref 0.50–1.35)
Total Protein: 6.9 g/dL (ref 6.0–8.3)

## 2011-04-06 LAB — POCT I-STAT, CHEM 8
BUN: 26 mg/dL — ABNORMAL HIGH (ref 6–23)
Calcium, Ion: 1.28 mmol/L (ref 1.12–1.32)
Creatinine, Ser: 1.7 mg/dL — ABNORMAL HIGH (ref 0.50–1.35)
Glucose, Bld: 123 mg/dL — ABNORMAL HIGH (ref 70–99)
Hemoglobin: 13.9 g/dL (ref 13.0–17.0)
Sodium: 143 mEq/L (ref 135–145)
TCO2: 22 mmol/L (ref 0–100)

## 2011-04-06 MED ORDER — MORPHINE SULFATE 2 MG/ML IJ SOLN: 1.0000 mg | INTRAMUSCULAR | Status: DC | PRN

## 2011-04-06 MED ORDER — ACETAMINOPHEN 325 MG PO TABS
650.0000 mg | ORAL_TABLET | Freq: Four times a day (QID) | ORAL | Status: DC | PRN
  Administered 2011-04-06: 650 mg via ORAL
  Filled 2011-04-06: qty 2

## 2011-04-06 MED ORDER — NITROGLYCERIN 0.2 MG/HR TD PT24
0.2000 mg | MEDICATED_PATCH | Freq: Every day | TRANSDERMAL | Status: DC
  Administered 2011-04-06: 0.2 mg via TRANSDERMAL
  Filled 2011-04-06: qty 1

## 2011-04-06 MED ORDER — ENOXAPARIN SODIUM 40 MG/0.4ML ~~LOC~~ SOLN
40.0000 mg | SUBCUTANEOUS | Status: DC
  Administered 2011-04-06: 40 mg via SUBCUTANEOUS
  Filled 2011-04-06: qty 0.4

## 2011-04-06 MED ORDER — FENOFIBRATE 54 MG PO TABS
54.0000 mg | ORAL_TABLET | Freq: Every day | ORAL | Status: DC
  Administered 2011-04-06: 54 mg via ORAL
  Filled 2011-04-06: qty 1

## 2011-04-06 MED ORDER — NITROGLYCERIN 0.4 MG SL SUBL: 0.4000 mg | SUBLINGUAL_TABLET | SUBLINGUAL | Status: DC | PRN

## 2011-04-06 MED ORDER — ASPIRIN 81 MG PO CHEW
324.0000 mg | CHEWABLE_TABLET | Freq: Once | ORAL | Status: AC
  Administered 2011-04-06: 324 mg via ORAL
  Filled 2011-04-06: qty 4

## 2011-04-06 MED ORDER — HYDROCODONE-ACETAMINOPHEN 5-325 MG PO TABS: 1.0000 | ORAL_TABLET | ORAL | Status: DC | PRN

## 2011-04-06 MED ORDER — SODIUM CHLORIDE 0.9 % IV SOLN
INTRAVENOUS | Status: DC
  Administered 2011-04-06: 07:00:00 via INTRAVENOUS

## 2011-04-06 MED ORDER — SODIUM CHLORIDE 0.9 % IJ SOLN
3.0000 mL | Freq: Two times a day (BID) | INTRAMUSCULAR | Status: DC
  Administered 2011-04-06: 3 mL via INTRAVENOUS

## 2011-04-06 MED ORDER — ACETAMINOPHEN 325 MG PO TABS: 650.0000 mg | ORAL_TABLET | Freq: Four times a day (QID) | ORAL | Status: DC | PRN

## 2011-04-06 MED ORDER — ACETAMINOPHEN 650 MG RE SUPP: 650.0000 mg | Freq: Four times a day (QID) | RECTAL | Status: DC | PRN

## 2011-04-06 MED ORDER — PANTOPRAZOLE SODIUM 40 MG PO TBEC
40.0000 mg | DELAYED_RELEASE_TABLET | Freq: Every day | ORAL | Status: DC
  Administered 2011-04-06: 40 mg via ORAL
  Filled 2011-04-06: qty 1

## 2011-04-06 MED ORDER — SODIUM CHLORIDE 0.9 % IV SOLN
INTRAVENOUS | Status: DC
  Administered 2011-04-06: 11:00:00 via INTRAVENOUS

## 2011-04-06 MED ORDER — ASPIRIN EC 81 MG PO TBEC
81.0000 mg | DELAYED_RELEASE_TABLET | Freq: Every day | ORAL | Status: DC
Start: 2011-04-06 — End: 2011-04-06
  Administered 2011-04-06: 81 mg via ORAL
  Filled 2011-04-06: qty 1

## 2011-04-06 NOTE — Progress Notes (Signed)
04/06/2011 Nastasha Reising SPARKS Case Management Note [phone]  Utilization review completed.  

## 2011-04-06 NOTE — ED Provider Notes (Signed)
History     CSN: [id number]  Arrival date & time 04/06/11  0122   First MD Initiated Contact with Patient 04/06/11 0142      Chief Complaint  Patient presents with  . Chest Pain  . Shortness of Breath    (Consider location/radiation/quality/duration/timing/severity/associated sxs/prior treatment) Patient is a 56 y.o. male presenting with chest pain and shortness of breath. The history is provided by the patient and the spouse.  Chest Pain The chest pain began 1 - 2 hours ago. Duration of episode(s) is 2 hours. Chest pain occurs constantly. The chest pain is improving. Associated with: Nothing. Was at work with onset of symptoms. The pain is currently at 1/10. The severity of the pain is moderate. The quality of the pain is described as pressure-like. The pain does not radiate. Exacerbated by: Nothing. Primary symptoms include shortness of breath and nausea. Pertinent negatives for primary symptoms include no fever, no syncope, no palpitations, no abdominal pain and no vomiting.  Associated symptoms include near-syncope.  Pertinent negatives for associated symptoms include no diaphoresis. He tried nothing for the symptoms.    Shortness of Breath  Associated symptoms include chest pain and shortness of breath. Pertinent negatives include no fever.    Past Medical History  Diagnosis Date  . Pacemaker   . Hypercholesterolemia     Past Surgical History  Procedure Date  . Pacemaker insertion     Family History  Problem Relation Age of Onset  . Cancer Mother   . Heart failure Father   . Heart failure Sister     History  Substance Use Topics  . Smoking status: Never Smoker   . Smokeless tobacco: Not on file  . Alcohol Use: No      Review of Systems  Constitutional: Negative for fever, chills and diaphoresis.  HENT: Negative for neck pain and neck stiffness.   Eyes: Negative for pain.  Respiratory: Positive for shortness of breath.   Cardiovascular: Positive for  chest pain and near-syncope. Negative for palpitations and syncope.  Gastrointestinal: Positive for nausea. Negative for vomiting and abdominal pain.  Genitourinary: Negative for dysuria.  Musculoskeletal: Negative for back pain.  Skin: Negative for rash.  Neurological: Negative for headaches.  All other systems reviewed and are negative.    Allergies  Other; Amlodipine besylate; Sulfonamide derivatives; and Levofloxacin  Home Medications   Current Outpatient Rx  Name Route Sig Dispense Refill  . ASPIRIN EC 81 MG PO TBEC Oral Take 81 mg by mouth daily.    . CHLORPHEN-PSEUDOEPHED-APAP 2-30-500 MG PO TABS Oral Take 1 tablet by mouth every 4 (four) hours as needed. Sinus pain, pressure and congestion    . VITAMIN D 1000 UNITS PO TABS Oral Take 1,000 Units by mouth daily.    . FENOFIBRATE 145 MG PO TABS Oral Take 145 mg by mouth daily.    [name] LYSINE 500 MG PO CAPS Oral Take 1 capsule by mouth daily.    [name] NIACIN 500 MG PO TABS Oral Take 500 mg by mouth daily with breakfast.    . FISH OIL PO Oral Take 1 tablet by mouth daily. 1200mg     . OMEPRAZOLE 20 MG PO CPDR Oral Take 20 mg by mouth daily.      BP 133/92  Pulse 58  Temp(Src) 98.2 F (36.8 C) (Oral)  Resp 17  SpO2 99%  Physical Exam  Constitutional: He is oriented to person, place, and time. He appears well-developed and well-nourished.  HENT:  Head: Normocephalic  and atraumatic.  Eyes: Conjunctivae and EOM are normal. Pupils are equal, round, and reactive to light.  Neck: Trachea normal. Neck supple. No thyromegaly present.  Cardiovascular: Normal rate, regular rhythm, S1 normal, S2 normal and normal pulses.     No systolic murmur is present   No diastolic murmur is present  Pulses:      Radial pulses are 2+ on the right side, and 2+ on the left side.  Pulmonary/Chest: Effort normal and breath sounds normal. He has no wheezes. He has no rhonchi. He has no rales. He exhibits no tenderness.  Abdominal: Soft. Normal appearance  and bowel sounds are normal. There is no tenderness. There is no CVA tenderness and negative Murphy's sign.  Musculoskeletal:       BLE:s Calves nontender, no cords or erythema, negative Homans sign  Neurological: He is alert and oriented to person, place, and time. He has normal strength. No cranial nerve deficit or sensory deficit. GCS eye subscore is 4. GCS verbal subscore is 5. GCS motor subscore is 6.  Skin: Skin is warm and dry. No rash noted. He is not diaphoretic.  Psychiatric: His speech is normal.       Cooperative and appropriate    ED Course  Procedures (including critical care time)  Labs Reviewed  POCT I-STAT, CHEM 8 - Abnormal; Notable for the following:    BUN 26 (*)    Creatinine, Ser 1.70 (*)    Glucose, Bld 123 (*)    All other components within normal limits  CBC  DIFFERENTIAL  POCT I-STAT TROPONIN I   Dg Chest Portable 1 View  04/06/2011  *RADIOLOGY REPORT*  Clinical Data: Shortness of breath and chest discomfort.  PORTABLE CHEST - 1 VIEW  Comparison: Chest radiograph performed 06/09/2010  Findings: The lungs are well-aerated and clear.  There is no evidence of focal opacification, pleural effusion or pneumothorax. Pulmonary vascularity is at the upper limits of normal.  The cardiomediastinal silhouette is borderline normal in size.  A pacemaker is noted overlying the left chest wall, with leads ending overlying the right atrium and right ventricle.  No acute osseous abnormalities are seen.  Cervical spinal fusion hardware is partially imaged.  IMPRESSION: No acute cardiopulmonary process seen.  Original Report Authenticated By: Tonia Ghent, M.D.     1. Chest pain     Date: 04/06/2011  Rate: 62  Rhythm: normal sinus rhythm and atrial paced  QRS Axis: normal  Intervals: normal  ST/T Wave abnormalities: nonspecific ST changes  Conduction Disutrbances:nonspecific intraventricular conduction delay and   Narrative Interpretation:   Old EKG Reviewed:  unchanged  Aspirin. Nitroglycerin. EKG. IV. Monitor. Case discussed with triad hospitalist as above who agrees to admission  MDM   Chest pain. Admit medicine.        Sunnie Nielsen, MD 04/06/11 [phone]

## 2011-04-06 NOTE — ED Notes (Signed)
Pt reports brief episode of pressure over left chest associated with mild sob and feeling light-headed that lasted approx ten minutes. Pt reports similar episodes of this occurring and his cardiologist has told him "its not cardiac related". Pt does not presently have any complaints. Resp are unlabored. Skin is warm and dry. Paced rhythm at 60 noted on monitor with stable vital signs. Breath sounds are clear. No extremity edema. Pt calm and in no acute distress. Pt on continuous cardiac, bp and pulse ox monitor.

## 2011-04-06 NOTE — Progress Notes (Signed)
*  PRELIMINARY RESULTS* Echocardiogram 2D Echocardiogram has been performed.  [name] 04/06/2011, 10:11 AM

## 2011-04-06 NOTE — Consult Note (Signed)
Reason for Consult: Chest pain  Requesting Physician: [facility]  HPI: This is a 56 y.o. male with a past medical history significant for a hsitory of prior catheretization in 2008 and April 2012 showing minor CAD. He did have a PTVD (MDT) placed in 06/2010 and is followed by Dr Royann Shivers. 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 His POC and first Troponin are negative. His Bun/Cr suggest he was dehydrated. He denies any significant SOB or palpitations, radiation of his pain, or nausea and vomiting. PMHx:  Past Medical History  Diagnosis Date  . Pacemaker   . Hypercholesterolemia    Past Surgical History  Procedure Date  . Pacemaker insertion     FAMHx: Family History  Problem Relation Age of Onset  . Cancer Mother   . Heart failure Father   . Heart failure Sister     SOCHx:  reports that he has never smoked. He does not have any smokeless tobacco history on file. He reports that he does not drink alcohol or use illicit drugs.  ALLERGIES: Allergies  Allergen Reactions  . Other Anaphylaxis    GI Cocktail  . Amlodipine Besylate     REACTION: legs swell  . Sulfonamide Derivatives     REACTION: tongue swells  . Levofloxacin Itching and Rash    ROS: Pertinent items are noted in HPI.  HOME MEDICATIONS: Prescriptions prior to admission  Medication Sig Dispense Refill  . aspirin EC 81 MG tablet Take 81 mg by mouth daily.      . chlorpheniramine-pseudoephedrine-acetaminophen (SINE-OFF) 2-30-500 MG per tablet Take 1 tablet by mouth every 4 (four) hours as needed. Sinus pain, pressure and congestion      . cholecalciferol (VITAMIN D) 1000 UNITS tablet Take 1,000 Units by mouth daily.      . fenofibrate (TRICOR) 145 MG tablet Take 145 mg by mouth daily.      [name] Lysine 500 MG CAPS Take 1 capsule by mouth daily.      . niacin 500 MG tablet Take 500 mg by mouth daily  with breakfast.      . Omega-3 Fatty Acids (FISH OIL PO) Take 1 tablet by mouth daily. 1200mg       . omeprazole (PRILOSEC) 20 MG capsule Take 20 mg by mouth daily.        HOSPITAL MEDICATIONS: I have reviewed the patient's current medications.  VITALS: Blood pressure 123/69, pulse 71, temperature 98 F (36.7 C), temperature source Oral, resp. rate 18, height 6\' 1"  (1.854 m), weight 115.5 kg (254 lb 10.1 oz), SpO2 96.00%.  PHYSICAL EXAM: General appearance: alert, cooperative and no distress Neck: no adenopathy, no carotid bruit, no JVD, supple, symmetrical, trachea midline and thyroid not enlarged, symmetric, no tenderness/mass/nodules Lungs: clear to auscultation bilaterally Heart: regular rate and rhythm Abdomen: soft, non-tender; bowel sounds normal; no masses,  no organomegaly Extremities: extremities normal, atraumatic, no cyanosis or edema Pulses: 3+/4 on Rt , faint on Lt L:E Skin: Skin color, texture, turgor normal. No rashes or lesions Neurologic: Grossly normal  LABS: Results for orders placed during the hospital encounter of 04/06/11 (from the past 48 hour(s))  CBC     Status: Normal   Collection Time   04/06/11  1:32 AM      Component Value Range Comment   WBC 8.3  4.0 - 10.5 (Hicks/uL)    RBC 4.50  4.22 - 5.81 (MIL/uL)  Hemoglobin 14.5  13.0 - 17.0 (g/dL)    HCT 16.1  09.6 - 04.5 (%)    MCV 89.8  78.0 - 100.0 (fL)    MCH 32.2  26.0 - 34.0 (pg)    MCHC 35.9  30.0 - 36.0 (g/dL)    RDW 40.9  81.1 - 91.4 (%)    Platelets 183  150 - 400 (Hicks/uL)   DIFFERENTIAL     Status: Normal   Collection Time   04/06/11  1:32 AM      Component Value Range Comment   Neutrophils Relative 49  43 - 77 (%)    Neutro Abs 4.1  1.7 - 7.7 (Hicks/uL)    Lymphocytes Relative 44  12 - 46 (%)    Lymphs Abs 3.7  0.7 - 4.0 (Hicks/uL)    Monocytes Relative 6  3 - 12 (%)    Monocytes Absolute 0.5  0.1 - 1.0 (Hicks/uL)    Eosinophils Relative 1  0 - 5 (%)    Eosinophils Absolute 0.1  0.0 - 0.7 (Hicks/uL)     Basophils Relative 0  0 - 1 (%)    Basophils Absolute 0.0  0.0 - 0.1 (Hicks/uL)   POCT I-STAT TROPONIN I     Status: Normal   Collection Time   04/06/11  1:40 AM      Component Value Range Comment   Troponin i, poc 0.00  0.00 - 0.08 (ng/mL)    Comment 3            POCT I-STAT, CHEM 8     Status: Abnormal   Collection Time   04/06/11  1:42 AM      Component Value Range Comment   Sodium 143  135 - 145 (mEq/L)    Potassium 3.5  3.5 - 5.1 (mEq/L)    Chloride 109  96 - 112 (mEq/L)    BUN 26 (*) 6 - 23 (mg/dL)    Creatinine, Ser 7.82 (*) 0.50 - 1.35 (mg/dL)    Glucose, Bld 956 (*) 70 - 99 (mg/dL)    Calcium, Ion 2.13  1.12 - 1.32 (mmol/L)    TCO2 22  0 - 100 (mmol/L)    Hemoglobin 13.9  13.0 - 17.0 (g/dL)    HCT 08.6  57.8 - 46.9 (%)   CBC     Status: Normal   Collection Time   04/06/11  6:25 AM      Component Value Range Comment   WBC 5.7  4.0 - 10.5 (Hicks/uL)    RBC 4.41  4.22 - 5.81 (MIL/uL)    Hemoglobin 14.3  13.0 - 17.0 (g/dL)    HCT 62.9  52.8 - 41.3 (%)    MCV 90.5  78.0 - 100.0 (fL)    MCH 32.4  26.0 - 34.0 (pg)    MCHC 35.8  30.0 - 36.0 (g/dL)    RDW 24.4  01.0 - 27.2 (%)    Platelets 163  150 - 400 (Hicks/uL)   TSH     Status: Normal   Collection Time   04/06/11  6:25 AM      Component Value Range Comment   TSH 2.254  0.350 - 4.500 (uIU/mL)   COMPREHENSIVE METABOLIC PANEL     Status: Abnormal   Collection Time   04/06/11  6:25 AM      Component Value Range Comment   Sodium 143  135 - 145 (mEq/L)    Potassium 3.8  3.5 - 5.1 (mEq/L)  Chloride 107  96 - 112 (mEq/L)    CO2 24  19 - 32 (mEq/L)    Glucose, Bld 90  70 - 99 (mg/dL)    BUN 25 (*) 6 - 23 (mg/dL)    Creatinine, Ser 4.78 (*) 0.50 - 1.35 (mg/dL)    Calcium 29.5  8.4 - 10.5 (mg/dL)    Total Protein 6.9  6.0 - 8.3 (g/dL)    Albumin 4.0  3.5 - 5.2 (g/dL)    AST 27  0 - 37 (U/L)    ALT 31  0 - 53 (U/L)    Alkaline Phosphatase 50  39 - 117 (U/L)    Total Bilirubin 0.5  0.3 - 1.2 (mg/dL)    GFR calc non Af Amer 52 (*)  >90 (mL/min)    GFR calc Af Amer 60 (*) >90 (mL/min)   CARDIAC PANEL(CRET KIN+CKTOT+MB+TROPI)     Status: Abnormal   Collection Time   04/06/11  6:30 AM      Component Value Range Comment   Total CK 311 (*) 7 - 232 (U/L)    CK, MB 5.5 (*) 0.3 - 4.0 (ng/mL)    Troponin I <0.30  <0.30 (ng/mL)    Relative Index 1.8  0.0 - 2.5      IMAGING: Dg Chest Portable 1 View  04/06/2011  *RADIOLOGY REPORT*  Clinical Data: Shortness of breath and chest discomfort.  PORTABLE CHEST - 1 VIEW  Comparison: Chest radiograph performed 06/09/2010  Findings: The lungs are well-aerated and clear.  There is no evidence of focal opacification, pleural effusion or pneumothorax. Pulmonary vascularity is at the upper limits of normal.  The cardiomediastinal silhouette is borderline normal in size.  A pacemaker is noted overlying the left chest wall, with leads ending overlying the right atrium and right ventricle.  No acute osseous abnormalities are seen.  Cervical spinal fusion hardware is partially imaged.  IMPRESSION: No acute cardiopulmonary process seen.  Original Report Authenticated By: Tonia Ghent, M.D.   EKG: A paced  IMPRESSION:  Principal Problem:  *Chest pain  Active Problems:  Dehydration  Renal insufficiency, secondary to dehydration? Cr 1.7 on admission  Pacemaker, MDT 5/12 for SB  Dyslipidemia  Family history of coronary artery disease  Hx of cardiac cath in 2008 and April 2012 showing no significant CAD   RECOMMENDATION:  We will be glad to take on our service. 2D ordered, f/u enzymes ordered. Consider home in am with OP Myoview if he rules out.   Time Spent Directly with Patient: 40 minutes  [name] 04/06/2011, 2:24 PM

## 2011-04-06 NOTE — H&P (Signed)
[name] is an 56 y.o. male.   Chief Complaint: Chest Pain  HPI: 56 yo old with history of Sick Sinus syndrome S/P Pacemaker insertion by [facility] here with substernal chest pain that was relieved by Nitroglycerin. Chest pain was 5/10, non radiating, no NVD, no Diaphoresis. Had some SOB, Has risk factors for CAD.  Past Medical History  Diagnosis Date  . Pacemaker   . Hypercholesterolemia     Past Surgical History  Procedure Date  . Pacemaker insertion     Family History  Problem Relation Age of Onset  . Cancer Mother   . Heart failure Father   . Heart failure Sister    Social History:  reports that he has never smoked. He does not have any smokeless tobacco history on file. He reports that he does not drink alcohol or use illicit drugs.  Allergies:  Allergies  Allergen Reactions  . Other Anaphylaxis    GI Cocktail  . Amlodipine Besylate     REACTION: legs swell  . Sulfonamide Derivatives     REACTION: tongue swells  . Levofloxacin Itching and Rash    Medications Prior to Admission  Medication Dose Route Frequency Provider Last Rate Last Dose  . 0.9 %  sodium chloride infusion   Intravenous Continuous Lonia Blood, MD 100 mL/hr at 04/06/11 0640    . acetaminophen (TYLENOL) tablet 650 mg  650 mg Oral Q6H PRN Lonia Blood, MD       Or  . acetaminophen (TYLENOL) suppository 650 mg  650 mg Rectal Q6H PRN Lonia Blood, MD      . aspirin chewable tablet 324 mg  324 mg Oral Once Sunnie Nielsen, MD   324 mg at 04/06/11 0236  . aspirin EC tablet 81 mg  81 mg Oral Daily Lonia Blood, MD      . enoxaparin (LOVENOX) injection 40 mg  40 mg Subcutaneous Q24H Lonia Blood, MD      . fenofibrate tablet 54 mg  54 mg Oral Daily Lonia Blood, MD      . HYDROcodone-acetaminophen (NORCO) 5-325 MG per tablet 1-2 tablet  1-2 tablet Oral Q4H PRN Lonia Blood, MD      . morphine 2 MG/ML injection 1 mg  1 mg Intravenous Q4H PRN Lonia Blood, MD      . nitroGLYCERIN (NITRODUR - Dosed in mg/24 hr) patch  0.2 mg  0.2 mg Transdermal Daily Lonia Blood, MD      . nitroGLYCERIN (NITROSTAT) SL tablet 0.4 mg  0.4 mg Sublingual Q5 min PRN Sunnie Nielsen, MD      . pantoprazole (PROTONIX) EC tablet 40 mg  40 mg Oral Q1200 Lonia Blood, MD      . sodium chloride 0.9 % injection 3 mL  3 mL Intravenous Q12H Lonia Blood, MD       No current outpatient prescriptions on file as of 04/06/2011.    Results for orders placed during the hospital encounter of 04/06/11 (from the past 48 hour(s))  CBC     Status: Normal   Collection Time   04/06/11  1:32 AM      Component Value Range Comment   WBC 8.3  4.0 - 10.5 (K/uL)    RBC 4.50  4.22 - 5.81 (MIL/uL)    Hemoglobin 14.5  13.0 - 17.0 (g/dL)    HCT 16.1  09.6 - 04.5 (%)    MCV 89.8  78.0 - 100.0 (fL)    MCH 32.2  26.0 - 34.0 (pg)  MCHC 35.9  30.0 - 36.0 (g/dL)    RDW 96.0  45.4 - 09.8 (%)    Platelets 183  150 - 400 (K/uL)   DIFFERENTIAL     Status: Normal   Collection Time   04/06/11  1:32 AM      Component Value Range Comment   Neutrophils Relative 49  43 - 77 (%)    Neutro Abs 4.1  1.7 - 7.7 (K/uL)    Lymphocytes Relative 44  12 - 46 (%)    Lymphs Abs 3.7  0.7 - 4.0 (K/uL)    Monocytes Relative 6  3 - 12 (%)    Monocytes Absolute 0.5  0.1 - 1.0 (K/uL)    Eosinophils Relative 1  0 - 5 (%)    Eosinophils Absolute 0.1  0.0 - 0.7 (K/uL)    Basophils Relative 0  0 - 1 (%)    Basophils Absolute 0.0  0.0 - 0.1 (K/uL)   POCT I-STAT TROPONIN I     Status: Normal   Collection Time   04/06/11  1:40 AM      Component Value Range Comment   Troponin i, poc 0.00  0.00 - 0.08 (ng/mL)    Comment 3            POCT I-STAT, CHEM 8     Status: Abnormal   Collection Time   04/06/11  1:42 AM      Component Value Range Comment   Sodium 143  135 - 145 (mEq/L)    Potassium 3.5  3.5 - 5.1 (mEq/L)    Chloride 109  96 - 112 (mEq/L)    BUN 26 (*) 6 - 23 (mg/dL)    Creatinine, Ser 1.19 (*) 0.50 - 1.35 (mg/dL)    Glucose, Bld 147 (*) 70 - 99 (mg/dL)    Calcium, Ion 8.29   1.12 - 1.32 (mmol/L)    TCO2 22  0 - 100 (mmol/L)    Hemoglobin 13.9  13.0 - 17.0 (g/dL)    HCT 56.2  13.0 - 86.5 (%)    Dg Chest Portable 1 View  04/06/2011  *RADIOLOGY REPORT*  Clinical Data: Shortness of breath and chest discomfort.  PORTABLE CHEST - 1 VIEW  Comparison: Chest radiograph performed 06/09/2010  Findings: The lungs are well-aerated and clear.  There is no evidence of focal opacification, pleural effusion or pneumothorax. Pulmonary vascularity is at the upper limits of normal.  The cardiomediastinal silhouette is borderline normal in size.  A pacemaker is noted overlying the left chest wall, with leads ending overlying the right atrium and right ventricle.  No acute osseous abnormalities are seen.  Cervical spinal fusion hardware is partially imaged.  IMPRESSION: No acute cardiopulmonary process seen.  Original Report Authenticated By: Tonia Ghent, M.D.    Review of Systems  Constitutional: Negative.   HENT: Negative.   Eyes: Negative.   Respiratory: Positive for shortness of breath.   Cardiovascular: Positive for chest pain.  Gastrointestinal: Negative.   Genitourinary: Negative.   Musculoskeletal: Negative.   Skin: Negative.   Neurological: Negative.   Endo/Heme/Allergies: Negative.   Psychiatric/Behavioral: Negative.     Blood pressure 150/93, pulse 62, temperature 98.4 F (36.9 C), temperature source Oral, resp. rate 18, height 6\' 1"  (1.854 m), weight 115.5 kg (254 lb 10.1 oz), SpO2 99.00%. Physical Exam  Constitutional: He is oriented to person, place, and time. He appears well-developed and well-nourished.  HENT:  Head: Normocephalic and atraumatic.  Right Ear: External ear normal.  Left Ear: External ear normal.  Nose: Nose normal.  Mouth/Throat: Oropharynx is clear and moist.  Eyes: Conjunctivae and EOM are normal. Pupils are equal, round, and reactive to light.  Neck: Normal range of motion. Neck supple.  Cardiovascular: Normal rate, regular rhythm,  normal heart sounds and intact distal pulses.   Respiratory: Effort normal and breath sounds normal.  GI: Soft. Bowel sounds are normal.  Musculoskeletal: Normal range of motion.  Neurological: He is alert and oriented to person, place, and time. He has normal reflexes.  Skin: Skin is warm and dry.  Psychiatric: He has a normal mood and affect. His behavior is normal. Judgment and thought content normal.     Assessment/Plan 1. Chest Pain: Admit for MI rule out. Check serial enzymes, give ASA, consider Stress test if ruled out. 2. Hyperlipidemia: check FLP. 3. ARF: Probabaly pre-renal due to dehydration. Hydrate and follow renal function 4. Dehydration: Hydrate 5. Sick Sinus syndrome: Pacemaker in place. Rate controlled  Durk Carmen,LAWAL 04/06/2011, 6:43 AM

## 2011-04-06 NOTE — Consult Note (Signed)
Pt. Seen and examined. Agree with the NP/PA-C note as written.  56 yo male with left chest or more likely left upper quadrant abdominal pain, nausea, diaphoresis and dizziness.  He has a history of presumed SSS s/p pacemaker for bradycardia.  He has never syncopized, but has been pre-syncopal many times. I suspect that he may have high vagal tone, which is often accompanied by vasodilatory episodes that cause hypotension and pre-syncope. In combination with his dehydration on admission, it seems most likely. I doubt he has new ischemia. Cardiac enzymes are negative x 1 set, the next is pending. He had a catheterization last year which was negative. If his repeat cardiac enzymes are negative, he could safely be discharged. Would recommend an outpatient nuclear stress test in our office later this week. He can be out of work until then.  I encouraged him to remain hydrated and I would not aggressively treat high blood pressure to give him a buffer against hypotension.  We will be happy to assume care and discharge on our service. Thanks to the hospitalists for admitting him.  Chrystie Nose, MD, [facility] Attending Cardiologist [facility]

## 2011-04-06 NOTE — Progress Notes (Signed)
Removed pt's Nitro patch on L shoulder per MD order

## 2011-04-06 NOTE — Progress Notes (Signed)
Discharge instructions given to pt and family. Verbalized understanding. Removed IV, catheter intact, no bleeding. Pt will walk with wife to transportation.

## 2011-04-06 NOTE — ED Notes (Signed)
Pt had onset of chest pressure and hot flushed feeling this evening that resolved on its own.  Pt is alert and aware of plan.  A paced on the monitor

## 2011-04-06 NOTE — ED Notes (Signed)
C/o chest discomfort onset at MN 000, also sob, light headed, "felt hot", nausea weakness. Has a pacemaker. Alert, NAD, calm, interactive

## 2011-04-06 NOTE — ED Notes (Signed)
Portable chest xray at bedside.

## 2011-04-06 NOTE — Discharge Instructions (Signed)
Dehydration Dehydration is the reduction of water and fluid from the body to a level below that required for proper functioning. CAUSES  Dehydration occurs when there is excessive fluid loss from the body or when loss of normal fluids is not adequately replaced.  Loss of fluids occurs in vomiting, diarrhea, excessive sweating, excessive urine output, or excessive loss of fluid from the lungs (as occurs in fever or in patients on a ventilator).   Inadequate fluid replacement occurs with nausea or decreased appetite due to illness, sore throat, or mouth pain.  SYMPTOMS  Mild dehydration  Thirst (infants and young children may not be able to tell you they are thirsty).   Dry lips.   Slightly dry mouth membranes.  Moderate dehydration  Very dry mouth membranes.   Sunken eyes.   Sunken soft spot (fontanelle) on infant's head.   Skin does not bounce back quickly when lightly pinched and released.   Decreased urine production.   Decreased tear production.  Severe dehydration  Rapid, weak pulse (more than 100 beats per minute at rest).   Cold hands and feet.   Loss of ability to sweat in spite of heat and temperature.   Rapid breathing.   Blue lips.   Confusion, lethargy, difficult to arouse.   Minimal urine production.   No tears.  DIAGNOSIS  Your caregiver will diagnose dehydration based on your symptoms and your exam. Blood and urine tests will help confirm the diagnosis. The diagnostic evaluation should also identify the cause of dehydration. PREVENTION  The body depends on a proper balance of fluid and salts (electrolytes) for normal function. Adequate fluid intake in the presence of illness or other stresses (such as extreme exercise) is important.  TREATMENT   Mild dehydration is safe to self-treat for most ages as long as it does not worsen. Contact your caregiver for even mild dehydration in infants and the elderly.   In teenagers and adults with moderate  dehydration, careful home treatment (as outlined below) can be safe. Phone contact with a caregiver is advised. Children under 53 years of age with moderate dehydration should see a caregiver.   If you or your child is severely dehydrated, go to a hospital for treatment. Intravenous (IV) fluids will quickly reverse dehydration and are often lifesaving in young children, infants, and elderly persons.  HOME CARE INSTRUCTIONS  Small amounts of fluids should be taken frequently. Large amounts at one time may not be tolerated. Plain water may be harmful in infants and the elderly. Oral rehydration solutions (ORS) are available at pharmacies and grocery stores. ORS replaces water and important electrolytes in proper proportions. Sports drinks are not as effective as ORS and may be harmful because the sugar can make diarrhea worse.  As a general guideline for children, replace any new fluid losses from diarrhea and/or vomiting with ORS as follows:   If your child weighs 22 pounds or under (10 kg or less), give 60-120 mL (1/4-1/2 cup or 2-4 ounces) of ORS for each diarrheal stool or vomiting episode.   If your child weighs more than 22 pounds (more than 10 kg), give 120-240 mL (1/2-1 cup or 4-8 ounces) of ORS for each diarrheal stool or vomiting episode.   If your child is vomiting, it may be helpful to give the above ORS replacement in 5 mL (1 teaspoon) amounts every 5 minutes and increase as tolerated.   While correcting for dehydration, children should eat normally. However, foods high in sugar should be  avoided because they may worsen diarrhea. Large amounts of carbonated soft drinks, juice, gelatin desserts, and other highly sugared drinks should be avoided.   After correction of dehydration, other liquids that are appealing to the child may be added. Children should drink small amounts of fluids frequently and fluids should be increased as tolerated. Children should drink enough fluids to keep urine  clear or pale yellow.   Adults should eat normally while drinking more fluids than usual. Drink small amounts of fluids frequently and increase the amount as tolerated. Drink enough fluids to keep urine clear or pale yellow. Broths, weak decaffeinated tea, lemon-lime soft drinks (allowed to go flat), and ORS replace fluids and electrolytes.   Avoid:   Carbonated drinks.   Juice.   Extremely hot or cold fluids.   Caffeine drinks.   Fatty, greasy foods.   Alcohol.   Tobacco.   Too much intake of anything at one time.   Gelatin desserts.   Probiotics are active cultures of beneficial bacteria. They may lessen the amount and number of diarrheal stools in adults. Probiotics can be found in yogurt with active cultures and in supplements.   Wash your hands well to avoid spreading germs (bacteria) and viruses.   Antidiarrheal medicines are not recommended for infants and children.   Only take over-the-counter or prescription medicines for pain, discomfort, or fever as directed by your caregiver. Do not give aspirin to children.   For adults with dehydration, ask your caregiver if you should continue all prescribed and over-the-counter medicines.   If your caregiver has given you a follow-up appointment, it is very important to keep that appointment. Not keeping the appointment could result in a lasting (chronic) or permanent injury and disability. If there is any problem keeping the appointment, you must call to reschedule.  SEEK IMMEDIATE MEDICAL CARE IF:   You are unable to keep fluids down or other symptoms become worse despite treatment.   Vomiting or diarrhea develops and becomes persistent.   There is vomiting of blood or green matter (bile).   There is blood in the stool or the stools are black and tarry.   There is no urine output in 6 to 8 hours or there is only a small amount of very dark urine.   Abdominal pain develops, increases, or localizes.   You or your child  has an oral temperature above 102 F (38.9 C), not controlled by medicine.   Your baby is older than 3 months with a rectal temperature of 102.50F (38.9 C) or higher.   Your baby is 22 months old or younger with a rectal temperature of 100.4 F (38 C) or higher.   You develop excessive weakness, dizziness, fainting, or extreme thirst.   You develop a rash, stiff neck, severe headache, or you become irritable, sleepy, or difficult to awaken.  MAKE SURE YOU:   Understand these instructions.   Will watch your condition.   Will get help right away if you are not doing well or get worse.  Document Released: 01/24/2005 Document Revised: 08/09/2010 Document Reviewed: 12/23/2008 [facility] Patient Information 2012 [place], [place].   Nothing to eat after 6am on Friday for stress test-be at the [place] office 1:30pm Friday    Return To Work ________________McCraw, Jimmy__________________________________ was treated at our hospital. INJURY OR ILLNES _____ Work-related ___x__ Not work-related _____ Undetermined if work-related RETURN TO WORK  Employee may return to work on: __3/4/13__________________   Employee may return to modified work on: ____________________  WORK ACTIVITY RESTRICTIONS Work activities not tolerated include: _____ Bending _____ Prolonged sitting _____ Lifting _____ Squatting _____ Prolonged standing _____ Climbing _____ Reaching _____ Pushing and pulling _____ Walking _____ Other ____________________ Show this Return to Work statement to your supervisor at work as soon as possible. Your employer should be aware of your condition and can help with the necessary work activity restrictions. If you wish to return to work sooner than the date above, or if you have further problems which make it difficult for you to return at that time, please call us or your caregiver. _________________________________________ Physician Name  (Printed) _________________________________________ Physician Signature  _________________________________________ Date Document Released: 01/24/2005 Document Revised: 10/06/2010 Document Reviewed: 07/11/2006 [facility] Patient Information 2012 [place], [place].

## 2011-04-06 NOTE — Discharge Summary (Signed)
Patient ID: [name],  MRN: [id number], DOB/AGE: 08-06-1955 56 y.o.  Admit date: 04/06/2011 Discharge date: 04/06/2011  Primary Care Provider:Dr Luking Primary Cardiologist: Dr Royann Shivers   Discharge Diagnoses  Principal Problem:  *Chest pain, atypical  Active Problems:  Dehydration  Renal insufficiency, secondary to dehydration? 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 He has never syncopized, but has been pre-syncopal many times. I suspect that he may have high vagal tone, which is often accompanied by vasodilatory episodes that cause hypotension and pre-syncope. In combination with his dehydration on admission, it seems most likely. I doubt he has new ischemia. Cardiac enzymes are negative x 1 set, the next is pending. He had a catheterization last year 4/12, and in 2008 which was negative. If his repeat cardiac enzymes are negative, he could safely be discharged. Would recommend an outpatient nuclear stress test in our office later this week. He can be out of work until then. I encouraged him to remain hydrated and I would not aggressively treat high blood pressure to give him a buffer against hypotension.  Discharge Vitals:  Blood pressure 123/69, pulse 71, temperature 98 F (36.7 C), temperature source Oral, resp. rate 18, height 6\' 1"  (1.854 m), weight 115.5 kg (254 lb 10.1 oz), SpO2 96.00%.    Labs: Results for orders placed during the hospital encounter of 04/06/11 (from the past 48 hour(s))  CBC     Status: Normal   Collection Time   04/06/11  1:32 AM      Component Value Range  Comment   WBC 8.3  4.0 - 10.5 (K/uL)    RBC 4.50  4.22 - 5.81 (MIL/uL)    Hemoglobin 14.5  13.0 - 17.0 (g/dL)    HCT 40.9  81.1 - 91.4 (%)    MCV 89.8  78.0 - 100.0 (fL)    MCH 32.2  26.0 - 34.0 (pg)    MCHC 35.9  30.0 - 36.0 (g/dL)    RDW 78.2  95.6 - 21.3 (%)    Platelets 183  150 - 400 (K/uL)   DIFFERENTIAL     Status: Normal   Collection Time   04/06/11  1:32 AM      Component Value Range Comment   Neutrophils Relative 49  43 - 77 (%)    Neutro Abs 4.1  1.7 - 7.7 (K/uL)    Lymphocytes Relative 44  12 - 46 (%)    Lymphs Abs 3.7  0.7 - 4.0 (K/uL)    Monocytes Relative 6  3 - 12 (%)    Monocytes Absolute 0.5  0.1 - 1.0 (K/uL)    Eosinophils Relative 1  0 - 5 (%)    Eosinophils Absolute 0.1  0.0 - 0.7 (K/uL)    Basophils Relative 0  0 - 1 (%)    Basophils Absolute 0.0  0.0 - 0.1 (K/uL)   POCT I-STAT TROPONIN I     Status: Normal   Collection Time   04/06/11  1:40 AM      Component Value Range Comment   Troponin i, poc 0.00  0.00 - 0.08 (ng/mL)    Comment 3            POCT I-STAT, CHEM 8     Status: Abnormal   Collection Time   04/06/11  1:42 AM      Component Value Range Comment   Sodium 143  135 - 145 (mEq/L)    Potassium 3.5  3.5 - 5.1 (mEq/L)    Chloride 109  96 - 112 (mEq/L)    BUN 26 (*) 6 - 23 (mg/dL)    Creatinine, Ser 1.61 (*) 0.50 - 1.35 (mg/dL)    Glucose, Bld 096 (*) 70 - 99 (mg/dL)    Calcium, Ion 0.45  1.12 - 1.32 (mmol/L)    TCO2 22  0 - 100 (mmol/L)    Hemoglobin 13.9  13.0 - 17.0 (g/dL)    HCT 40.9  81.1 - 91.4 (%)   CBC     Status: Normal   Collection Time   04/06/11  6:25 AM      Component Value Range Comment   WBC 5.7  4.0 - 10.5 (K/uL)    RBC 4.41  4.22 - 5.81 (MIL/uL)    Hemoglobin 14.3  13.0 - 17.0 (g/dL)    HCT 78.2  95.6 - 21.3 (%)    MCV 90.5  78.0 - 100.0 (fL)    MCH 32.4  26.0 - 34.0 (pg)    MCHC 35.8  30.0 - 36.0 (g/dL)    RDW 08.6  57.8 - 46.9 (%)    Platelets 163  150 - 400 (K/uL)   TSH     Status: Normal   Collection Time   04/06/11   6:25 AM      Component Value Range Comment   TSH 2.254  0.350 - 4.500 (uIU/mL)   COMPREHENSIVE METABOLIC PANEL     Status: Abnormal   Collection Time   04/06/11  6:25 AM      Component Value Range Comment   Sodium 143  135 - 145 (mEq/L)    Potassium 3.8  3.5 - 5.1 (mEq/L)    Chloride 107  96 - 112 (mEq/L)    CO2 24  19 - 32 (mEq/L)    Glucose, Bld 90  70 - 99 (mg/dL)    BUN 25 (*) 6 - 23 (mg/dL)    Creatinine, Ser 6.29 (*) 0.50 - 1.35 (mg/dL)    Calcium 52.8  8.4 - 10.5 (mg/dL)    Total Protein 6.9  6.0 - 8.3 (g/dL)    Albumin 4.0  3.5 - 5.2 (g/dL)    AST 27  0 - 37 (U/L)    ALT 31  0 - 53 (U/L)    Alkaline Phosphatase 50  39 - 117 (U/L)    Total Bilirubin 0.5  0.3 - 1.2 (mg/dL)    GFR calc non Af Amer 52 (*) >90 (mL/min)    GFR calc Af Amer 60 (*) >90 (mL/min)   CARDIAC PANEL(CRET KIN+CKTOT+MB+TROPI)     Status: Abnormal   Collection Time   04/06/11  6:30 AM      Component Value Range Comment   Total CK 311 (*) 7 - 232 (U/L)    CK, MB 5.5 (*) 0.3 - 4.0 (ng/mL)    Troponin I <0.30  <0.30 (ng/mL)    Relative Index 1.8  0.0 - 2.5    CARDIAC PANEL(CRET KIN+CKTOT+MB+TROPI)     Status: Abnormal   Collection Time   04/06/11  2:06 PM  Component Value Range Comment   Total CK 227  7 - 232 (U/L)    CK, MB 4.6 (*) 0.3 - 4.0 (ng/mL)    Troponin I <0.30  <0.30 (ng/mL)    Relative Index 2.0  0.0 - 2.5      Disposition:  Follow-up Information    Follow up with LUKING,SCOTT, MD. Schedule an appointment as soon as possible for a visit in 2 weeks.      Follow up with Thurmon Fair, MD. (office will call)    Contact information:   [address] [place] [address] [phone]          Discharge Medications:  Medication List  As of 04/06/2011  3:34 PM   TAKE these medications         acetaminophen 325 MG tablet   Commonly known as: TYLENOL   Take 2 tablets (650 mg total) by mouth every 6 (six) hours as needed (or Fever >/= 101).      aspirin  EC 81 MG tablet   Take 81 mg by mouth daily.      cholecalciferol 1000 UNITS tablet   Commonly known as: VITAMIN D   Take 1,000 Units by mouth daily.      fenofibrate 145 MG tablet   Commonly known as: TRICOR   Take 145 mg by mouth daily.      FISH OIL PO   Take 1 tablet by mouth daily. 1200mg       Lysine 500 MG Caps   Take 1 capsule by mouth daily.      niacin 500 MG tablet   Take 500 mg by mouth daily with breakfast.      omeprazole 20 MG capsule   Commonly known as: PRILOSEC   Take 20 mg by mouth daily.      SINE-OFF 2-30-500 MG per tablet   Generic drug: chlorpheniramine-pseudoephedrine-acetaminophen   Take 1 tablet by mouth every 4 (four) hours as needed. Sinus pain, pressure and congestion            Outstanding Labs/Studies  Duration of Discharge Encounter: Greater than 30 minutes including physician time.  Jolene Provost PA-C 04/06/2011 3:34 PM

## 2011-04-06 NOTE — Progress Notes (Signed)
[name] CSN:[id number],MRN:[id number] is a 56 y.o. male,  Outpatient Primary MD for the patient is LUKING,SCOTT, MD, MD  Chief Complaint  Patient presents with  . Chest Pain  . Shortness of Breath        Subjective:   [name] today has, No headache, No chest pain, No abdominal pain - No Nausea, No new weakness tingling or numbness, No Cough - SOB.    Objective:   Filed Vitals:   04/06/11 0200 04/06/11 0300 04/06/11 0420 04/06/11 0458  BP: 128/89 133/92 123/93 150/93  Pulse: 58 58 60 62  Temp:    98.4 F (36.9 C)  TempSrc:    Oral  Resp: 14 17 18 18   Height:    6\' 1"  (1.854 m)  Weight:    115.5 kg (254 lb 10.1 oz)  SpO2: 98% 99% 98% 99%    Wt Readings from Last 3 Encounters:  04/06/11 115.5 kg (254 lb 10.1 oz)     Intake/Output Summary (Last 24 hours) at 04/06/11 1049 Last data filed at 04/06/11 0730  Gross per 24 hour  Intake    240 ml  Output      0 ml  Net    240 ml    Exam Awake Alert, Oriented *3, No new F.N deficits, Normal affect [place].AT,PERRAL Supple Neck,No JVD, No cervical lymphadenopathy appriciated.  Symmetrical Chest wall movement, Good air movement bilaterally, CTAB RRR,No Gallops,Rubs or new Murmurs, No Parasternal Heave +ve B.Sounds, Abd Soft, Non tender, No organomegaly appriciated, No rebound -guarding or rigidity. No Cyanosis, Clubbing or edema, No new Rash or bruise     Data Review  CBC  Lab 04/06/11 0625 04/06/11 0142 04/06/11 0132  WBC 5.7 -- 8.3  HGB 14.3 13.9 14.5  HCT 39.9 41.0 40.4  PLT 163 -- 183  MCV 90.5 -- 89.8  MCH 32.4 -- 32.2  MCHC 35.8 -- 35.9  RDW 12.4 -- 12.4  LYMPHSABS -- -- 3.7  MONOABS -- -- 0.5  EOSABS -- -- 0.1  BASOSABS -- -- 0.0  BANDABS -- -- --    Chemistries   Lab 04/06/11 0625 04/06/11 0142  NA 143 143  K 3.8 3.5  CL 107 109  CO2 24 --  GLUCOSE 90 123*  BUN 25* 26*  CREATININE 1.49* 1.70*  CALCIUM 10.3 --  MG -- --  AST 27 --  ALT 31 --  ALKPHOS 50 --  BILITOT 0.5 --    ------------------------------------------------------------------------------------------------------------------ estimated creatinine clearance is 75.4 ml/min (by C-G formula based on Cr of 1.49). ------------------------------------------------------------------------------------------------------------------ No results found for this basename: HGBA1C:2 in the last 72 hours ------------------------------------------------------------------------------------------------------------------ No results found for this basename: CHOL:2,HDL:2,LDLCALC:2,TRIG:2,CHOLHDL:2,LDLDIRECT:2 in the last 72 hours ------------------------------------------------------------------------------------------------------------------ No results found for this basename: TSH,T4TOTAL,FREET3,T3FREE,THYROIDAB in the last 72 hours ------------------------------------------------------------------------------------------------------------------ No results found for this basename: VITAMINB12:2,FOLATE:2,FERRITIN:2,TIBC:2,IRON:2,RETICCTPCT:2 in the last 72 hours  Coagulation profile No results found for this basename: INR:5,PROTIME:5 in the last 168 hours  No results found for this basename: DDIMER:2 in the last 72 hours  Cardiac Enzymes  Lab 04/06/11 0630  CKMB 5.5*  TROPONINI <0.30  MYOGLOBIN --   ------------------------------------------------------------------------------------------------------------------ No components found with this basename: POCBNP:3  Micro Results No results found for this or any previous visit (from the past 240 hour(s)).  Radiology Reports Dg Chest Portable 1 View  04/06/2011  *RADIOLOGY REPORT*  Clinical Data: Shortness of breath and chest discomfort.  PORTABLE CHEST - 1 VIEW  Comparison: Chest radiograph performed 06/09/2010  Findings: The lungs are well-aerated and  clear.  There is no evidence of focal opacification, pleural effusion or pneumothorax. Pulmonary vascularity is at the  upper limits of normal.  The cardiomediastinal silhouette is borderline normal in size.  A pacemaker is noted overlying the left chest wall, with leads ending overlying the right atrium and right ventricle.  No acute osseous abnormalities are seen.  Cervical spinal fusion hardware is partially imaged.  IMPRESSION: No acute cardiopulmonary process seen.  Original Report Authenticated By: Tonia Ghent, M.D.    Scheduled Meds:   . aspirin  324 mg Oral Once  . aspirin EC  81 mg Oral Daily  . enoxaparin  40 mg Subcutaneous Q24H  . fenofibrate  54 mg Oral Daily  . nitroGLYCERIN  0.2 mg Transdermal Daily  . pantoprazole  40 mg Oral Q1200  . sodium chloride  3 mL Intravenous Q12H   Continuous Infusions:   . sodium chloride    . DISCONTD: sodium chloride 100 mL/hr at 04/06/11 0640   PRN Meds:.acetaminophen, acetaminophen, HYDROcodone-acetaminophen, morphine, nitroGLYCERIN  Assessment & Plan   1. Atypical Chest pain with some dizziness - rule out MI, PRN NTG for chest tightness, follow Trop kinetics, continue ASA and Febrate, outpt lipid panel monitor, S.east cards to see. Echo pending.   2. Dehydration with ARF - improved with IVF, continue.   3. H/O Sick Sinus post Pacemaker - Cards to see, no acute issues.     DVT Prophylaxis  Lovenox    See all Orders from today for further details   Leroy Sea M.D on 04/06/2011 at 10:49 AM  [facility] Office  [phone]

## 2011-04-06 NOTE — ED Notes (Signed)
MD at bedside. 

## 2011-04-06 NOTE — Discharge Summary (Signed)
Nafis Farnan C. Amarius Toto, MD, FACC Attending Cardiologist [facility]  

## 2011-04-08 HISTORY — PX: CARDIOVASCULAR STRESS TEST: SHX262

## 2011-10-20 ENCOUNTER — Encounter: Payer: Self-pay | Admitting: Gastroenterology

## 2011-11-16 ENCOUNTER — Ambulatory Visit (INDEPENDENT_AMBULATORY_CARE_PROVIDER_SITE_OTHER): Payer: Managed Care, Other (non HMO) | Admitting: Gastroenterology

## 2011-11-16 ENCOUNTER — Encounter: Payer: Self-pay | Admitting: Gastroenterology

## 2011-11-16 VITALS — BP 124/88 | HR 64 | Ht 72.0 in | Wt 248.0 lb

## 2011-11-16 DIAGNOSIS — R1012 Left upper quadrant pain: Secondary | ICD-10-CM

## 2011-11-16 NOTE — Progress Notes (Signed)
Review of pertinent gastrointestinal problems: 1. Routine risk for colon cancer; colonoscopy 2011 ([facility]) found single small HP polyp, recommended to have recall examination at 10 year interval.   HPI: This is a   very pleasant 56 year old man whom I last saw the time of the screening colonoscopy. He is here today with his wife to discuss a new problem.   He has been having left upper quadrant and left chest pains.  The pain occurs randomly, 6-8 times per week.  It is a sharp stabbing pain, interally.  Last a few seconds only. Then it is gone.  This is not related to eating.  Not related to BMs.  Not related to any body positions. Has awakened him from sleep.  No associated nausea, vomiting.  No paticular time of day that it occurs.  Last 2-3 seconds only.  Takes alleve rarely.  He does get pyrosis, takes omeprazole daily usually and this helps the pyrosis.  No surgery on abdomen.  His cardiologist said the chest pains was 'non-cardiac,' has had stress testing this year, has had at least 2 angiograms.    He has issues with bradycardia, Sick Sinus Syndrome, implanted pacemaker 06/2010  The pains has been going on several months.  His weigh is up, down but overall normal.  Review of systems: Pertinent positive and negative review of systems were noted in the above HPI section. Complete review of systems was performed and was otherwise normal.    Past Medical History  Diagnosis Date  . Pacemaker   . Hypercholesterolemia   . GERD (gastroesophageal reflux disease)   . Syncope   . Hypotension     Past Surgical History  Procedure Date  . Pacemaker insertion   . Cervical spine surgery     plates and screws  . Tonsillectomy   . Bladder surgery     stretched  . Birthmark removal     right thigh  . Wisdom tooth extraction     Current Outpatient Prescriptions  Medication Sig Dispense Refill  . aspirin EC 81 MG tablet Take 81 mg by mouth daily.      .  chlorpheniramine-pseudoephedrine-acetaminophen (SINE-OFF) 2-30-500 MG per tablet Take 1 tablet by mouth every 4 (four) hours as needed. Sinus pain, pressure and congestion      . fenofibrate (TRICOR) 145 MG tablet Take 145 mg by mouth daily.      [name] Lysine 500 MG CAPS Take 1 capsule by mouth daily.      . Naproxen Sodium (ALEVE) 220 MG CAPS Take 1 capsule by mouth as needed.      . Omega-3 Fatty Acids (FISH OIL PO) Take 1 tablet by mouth daily. 1200mg       . omeprazole (PRILOSEC) 20 MG capsule Take 20 mg by mouth daily.        Allergies as of 11/16/2011 - Review Complete 11/16/2011  Allergen Reaction Noted  . Other Anaphylaxis 04/06/2011  . Amlodipine besylate  11/09/2009  . Norvasc (amlodipine besylate) Swelling 11/16/2011  . Sulfonamide derivatives  11/09/2009  . Levofloxacin Itching and Rash 11/09/2009    Family History  Problem Relation Age of Onset  . Ovarian cancer Mother   . Heart failure Father   . Heart attack Sister   . Lymphoma Brother     x 2  . COPD Sister   . Hypertension Brother   . Hyperlipidemia Brother   . Hyperlipidemia Sister     History   Social History  . Marital Status: Married  Spouse Name: N/A    Number of Children: 2  . Years of Education: N/A   Occupational History  . warehouse tech [employer]   Social History Main Topics  . Smoking status: Former Smoker    Types: Cigarettes  . Smokeless tobacco: Never Used  . Alcohol Use: No  . Drug Use: No  . Sexually Active: Not on file   Other Topics Concern  . Not on file   Social History Narrative  . No narrative on file       Physical Exam: BP 124/88  Pulse 64  Ht 6' (1.829 m)  Wt 248 lb (112.492 kg)  BMI 33.63 kg/m2 Constitutional: generally well-appearing Psychiatric: alert and oriented x3 Eyes: extraocular movements intact Mouth: oral pharynx moist, no lesions Neck: supple no lymphadenopathy Cardiovascular: heart regular rate and rhythm Lungs: clear to auscultation  bilaterally Abdomen: soft, nontender, nondistended, no obvious ascites, no peritoneal signs, normal bowel sounds Extremities: no lower extremity edema bilaterally Skin: no lesions on visible extremities    Assessment and plan: 56 y.o. male with  intermittent, very brief left chest, left upper quadrant pains  Pains are really in the left chest however he did have some minor indigestion last week. He has 3 brothers with lymphoma and he and his wife are concerned that that may be a cause. The pain really sounds non-gastrointestinal. Going to set him up with a CAT scan abdomen and pelvis given his left upper quadrant, left chest pain and family history of lymphoma. If this is negative then I proceeded an EGD. If that is negative then we can consider abdominal ultrasound of his gallbladder check for gallstones all however the character of his pain would be quite unusual for gallstone pain.

## 2011-11-16 NOTE — Patient Instructions (Addendum)
You will be set up for a CT scan of abdomen and pelvis with IV and oral contrast (abdomen, pelvis) for luq abdominal pains and FH of lymphoma.  IF this is negative, then EGD.  You have been scheduled for a CT scan of the abdomen and pelvis at [facility] ([address]---this is in the same building as Architectural technologist).   You are scheduled on 11/21/11 at 930 am  . You should arrive 15 minutes prior to your appointment time for registration. Please follow the written instructions below on the day of your exam:  WARNING: IF YOU ARE ALLERGIC TO IODINE/X-RAY DYE, PLEASE NOTIFY RADIOLOGY IMMEDIATELY AT [phone]! YOU WILL BE GIVEN A 13 HOUR PREMEDICATION PREP.  1) Do not eat or drink anything after 530 am  (4 hours prior to your test) 2) You have been given 2 bottles of oral contrast to drink. The solution may taste better if refrigerated, but do NOT add ice or any other liquid to this solution. Shake well before drinking.    Drink 1 bottle of contrast @ 730 am  (2 hours prior to your exam)  Drink 1 bottle of contrast @ 830 am  (1 hour prior to your exam)  You may take any medications as prescribed with a small amount of water except for the following: Metformin, Glucophage, Glucovance, Avandamet, Riomet, Fortamet, Actoplus Met, Janumet, Glumetza or Metaglip. The above medications must be held the day of the exam AND 48 hours after the exam.  The purpose of you drinking the oral contrast is to aid in the visualization of your intestinal tract. The contrast solution may cause some diarrhea. Before your exam is started, you will be given a small amount of fluid to drink. Depending on your individual set of symptoms, you may also receive an intravenous injection of x-ray contrast/dye. Plan on being at [facility] for 30 minutes or long, depending on the type of exam you are having performed.  If you have any questions regarding your exam or if you need to reschedule, you may  call the CT department at [phone] between the hours of 8:00 am and 5:00 pm, Monday-Friday.  ________________________________________________________________________

## 2011-11-17 ENCOUNTER — Other Ambulatory Visit: Payer: Managed Care, Other (non HMO)

## 2011-11-21 ENCOUNTER — Ambulatory Visit (INDEPENDENT_AMBULATORY_CARE_PROVIDER_SITE_OTHER)
Admission: RE | Admit: 2011-11-21 | Discharge: 2011-11-21 | Disposition: A | Discharge status: Home / Self Care | Payer: Managed Care, Other (non HMO) | Source: Ambulatory Visit | Attending: Gastroenterology | Admitting: Gastroenterology

## 2011-11-21 DIAGNOSIS — R1012 Left upper quadrant pain: Secondary | ICD-10-CM

## 2011-11-21 MED ORDER — IOHEXOL 300 MG/ML  SOLN
100.0000 mL | Freq: Once | INTRAMUSCULAR | Status: AC | PRN
  Administered 2011-11-21: 100 mL via INTRAVENOUS

## 2011-11-23 ENCOUNTER — Encounter: Payer: Self-pay | Admitting: Gastroenterology

## 2011-11-23 ENCOUNTER — Other Ambulatory Visit: Payer: Self-pay

## 2011-11-23 MED ORDER — OMEPRAZOLE 20 MG PO CPDR: 20.0000 mg | DELAYED_RELEASE_CAPSULE | Freq: Every day | ORAL | Status: DC

## 2011-11-28 ENCOUNTER — Encounter: Payer: Self-pay | Admitting: Gastroenterology

## 2011-11-28 ENCOUNTER — Ambulatory Visit (AMBULATORY_SURGERY_CENTER): Payer: Managed Care, Other (non HMO) | Admitting: *Deleted

## 2011-11-28 VITALS — Ht 73.0 in | Wt 246.2 lb

## 2011-11-28 DIAGNOSIS — R1012 Left upper quadrant pain: Secondary | ICD-10-CM

## 2011-12-05 ENCOUNTER — Ambulatory Visit (AMBULATORY_SURGERY_CENTER): Payer: Managed Care, Other (non HMO) | Admitting: Gastroenterology

## 2011-12-05 ENCOUNTER — Encounter: Payer: Self-pay | Admitting: Gastroenterology

## 2011-12-05 VITALS — BP 123/79 | HR 62 | Temp 97.8°F | Resp 15 | Ht 72.0 in | Wt 248.0 lb

## 2011-12-05 DIAGNOSIS — K228 Other specified diseases of esophagus: Secondary | ICD-10-CM

## 2011-12-05 DIAGNOSIS — R933 Abnormal findings on diagnostic imaging of other parts of digestive tract: Secondary | ICD-10-CM

## 2011-12-05 DIAGNOSIS — K2970 Gastritis, unspecified, without bleeding: Secondary | ICD-10-CM

## 2011-12-05 DIAGNOSIS — K2960 Other gastritis without bleeding: Secondary | ICD-10-CM

## 2011-12-05 DIAGNOSIS — K2990 Gastroduodenitis, unspecified, without bleeding: Secondary | ICD-10-CM

## 2011-12-05 DIAGNOSIS — R1012 Left upper quadrant pain: Secondary | ICD-10-CM

## 2011-12-05 MED ORDER — SODIUM CHLORIDE 0.9 % IV SOLN: 500.0000 mL | INTRAVENOUS | Status: DC

## 2011-12-05 NOTE — Patient Instructions (Addendum)
Discharge instructions given with verbal understanding. Biopsies taken. Resume previous medications. YOU HAD AN ENDOSCOPIC PROCEDURE TODAY AT THE [facility]: Refer to the procedure report that was given to you for any specific questions about what was found during the examination.  If the procedure report does not answer your questions, please call your gastroenterologist to clarify.  If you requested that your care partner not be given the details of your procedure findings, then the procedure report has been included in a sealed envelope for you to review at your convenience later.  YOU SHOULD EXPECT: Some feelings of bloating in the abdomen. Passage of more gas than usual.  Walking can help get rid of the air that was put into your GI tract during the procedure and reduce the bloating. If you had a lower endoscopy (such as a colonoscopy or flexible sigmoidoscopy) you may notice spotting of blood in your stool or on the toilet paper. If you underwent a bowel prep for your procedure, then you may not have a normal bowel movement for a few days.  DIET: Your first meal following the procedure should be a light meal and then it is ok to progress to your normal diet.  A half-sandwich or bowl of soup is an example of a good first meal.  Heavy or fried foods are harder to digest and may make you feel nauseous or bloated.  Likewise meals heavy in dairy and vegetables can cause extra gas to form and this can also increase the bloating.  Drink plenty of fluids but you should avoid alcoholic beverages for 24 hours.  ACTIVITY: Your care partner should take you home directly after the procedure.  You should plan to take it easy, moving slowly for the rest of the day.  You can resume normal activity the day after the procedure however you should NOT DRIVE or use heavy machinery for 24 hours (because of the sedation medicines used during the test).    SYMPTOMS TO REPORT IMMEDIATELY: A gastroenterologist  can be reached at any hour.  During normal business hours, 8:30 AM to 5:00 PM Monday through Friday, call [phone].  After hours and on weekends, please call the GI answering service at [phone] who will take a message and have the physician on call contact you.   Following upper endoscopy (EGD)  Vomiting of blood or coffee ground material  New chest pain or pain under the shoulder blades  Painful or persistently difficult swallowing  New shortness of breath  Fever of 100F or higher  Black, tarry-looking stools  FOLLOW UP: If any biopsies were taken you will be contacted by phone or by letter within the next 1-3 weeks.  Call your gastroenterologist if you have not heard about the biopsies in 3 weeks.  Our staff will call the home number listed on your records the next business day following your procedure to check on you and address any questions or concerns that you may have at that time regarding the information given to you following your procedure. This is a courtesy call and so if there is no answer at the home number and we have not heard from you through the emergency physician on call, we will assume that you have returned to your regular daily activities without incident.  SIGNATURES/CONFIDENTIALITY: You and/or your care partner have signed paperwork which will be entered into your electronic medical record.  These signatures attest to the fact that that the information above on your After   Visit Summary has been reviewed and is understood.  Full responsibility of the confidentiality of this discharge information lies with you and/or your care-partner. 

## 2011-12-05 NOTE — Progress Notes (Signed)
Patient did not experience any of the following events: a burn prior to discharge; a fall within the facility; wrong site/side/patient/procedure/implant event; or a hospital transfer or hospital admission upon discharge from the facility. (G8907) Patient did not have preoperative order for IV antibiotic SSI prophylaxis. ([id number])  

## 2011-12-05 NOTE — Op Note (Signed)
[facility] [address] [place], [address]   ENDOSCOPY PROCEDURE REPORT  PATIENT: [name], [name]  MR#: [id number] BIRTHDATE: July 21, 1955 , 56  yrs. old GENDER: Male ENDOSCOPIST: Rachael Fee, MD REFERRED BY:  Lilyan Punt, M.D. PROCEDURE DATE:  12/05/2011 PROCEDURE:  EGD w/ biopsy ASA CLASS:     Class II INDICATIONS:  intermittent LUQ, chest pain (recent labs, CT scan, cardiology evaluation were essentialy normal).  Pains are not related to eating, moving bowels or body position.  No associated nausea.  + pyrosis well controlled on once daily PPI MEDICATIONS: Fentanyl 50 mcg IV, Versed 5 mg IV, and These medications were titrated to patient response per physician's verbal order TOPICAL ANESTHETIC: Cetacaine Spray  DESCRIPTION OF PROCEDURE: After the risks benefits and alternatives of the procedure were thoroughly explained, informed consent was obtained.  The LB [id number] endoscope was introduced through the mouth and advanced to the second portion of the duodenum. Without limitations.  The instrument was slowly withdrawn as the mucosa was fully examined.  There was mild, non-specific gastritis.  Biopsies taken and sent to pathology.  There were two short (1cm) tongues of non-nodular Barrett's appeaing mucoca at distal esophagus.  This was biopsied and sent to pathology.  The examination was otherwise normal. Retroflexed views revealed no abnormalities.     The scope was then withdrawn from the patient and the procedure completed. COMPLICATIONS: There were no complications.  ENDOSCOPIC IMPRESSION: There was mild, non-specific gastritis.  Biopsies taken and sent to pathology.  There were two short (1cm) tongues of non-nodular Barrett's appeaing mucoca at distal esophagus.  This was biopsied and sent to pathology.  The examination was otherwise normal.  RECOMMENDATIONS: await biopsy results    eSigned:  Rachael Fee, MD 12/05/2011 10:44  AM

## 2011-12-06 ENCOUNTER — Telehealth: Payer: Self-pay | Admitting: *Deleted

## 2011-12-06 NOTE — Telephone Encounter (Signed)
  Follow up Call-  Call back number 12/05/2011  Post procedure Call Back phone  # [phone]  Permission to leave phone message Yes     Patient questions:  Do you have a fever, pain , or abdominal swelling? no Pain Score  0 *  Have you tolerated food without any problems? yes  Have you been able to return to your normal activities? yes  Do you have any questions about your discharge instructions: Diet   no Medications  no Follow up visit  no  Do you have questions or concerns about your Care? no  Actions: * If pain score is 4 or above: No action needed, pain <4.

## 2011-12-11 ENCOUNTER — Encounter: Payer: Self-pay | Admitting: Gastroenterology

## 2012-05-04 ENCOUNTER — Ambulatory Visit (INDEPENDENT_AMBULATORY_CARE_PROVIDER_SITE_OTHER): Payer: Managed Care, Other (non HMO) | Admitting: Family Medicine

## 2012-05-04 ENCOUNTER — Encounter: Payer: Self-pay | Admitting: Family Medicine

## 2012-05-04 VITALS — BP 147/92 | Temp 97.9°F | Ht 73.0 in | Wt 256.0 lb

## 2012-05-04 DIAGNOSIS — N410 Acute prostatitis: Secondary | ICD-10-CM | POA: Insufficient documentation

## 2012-05-04 DIAGNOSIS — R109 Unspecified abdominal pain: Secondary | ICD-10-CM

## 2012-05-04 LAB — POCT URINALYSIS DIPSTICK: Spec Grav, UA: <=1.005

## 2012-05-04 MED ORDER — DOXYCYCLINE HYCLATE 100 MG PO TABS: 100.0000 mg | ORAL_TABLET | Freq: Two times a day (BID) | ORAL | Status: DC

## 2012-05-04 NOTE — Patient Instructions (Signed)
Take all the antibiotics 

## 2012-05-04 NOTE — Progress Notes (Signed)
  Subjective:    Patient ID: [name], male    DOB: [birth year], 57 y.o.   MRN: [id number]  Abdominal Pain This is a new problem. The current episode started in the past 7 days. The onset quality is gradual. The problem occurs intermittently. The problem has been gradually worsening. The pain is located in the rectum, RLQ and RUQ. The pain is at a severity of 5/10. The pain is moderate. The quality of the pain is aching. The abdominal pain does not radiate. The pain is relieved by nothing. He has tried acetaminophen for the symptoms. The treatment provided moderate relief. There is no history of abdominal surgery.      Review of Systems  Gastrointestinal: Positive for abdominal pain.       Objective:   Physical Exam Alert HEENT normal. Vitals within normal limits. Lungs clear. Heart regular in rhythm. Abdomen excellent bowel sounds no discrete tenderness mild suprapubic tenderness. Prostate tender boggy. Urinalysis 4-6 white blood cells per high-power field.       Assessment & Plan:  Impression acute prostatitis. Aggravated by decongestants. Multiple medicine sensitivities discussed. Plan Doxy 100 twice a day 30 days. Symptomatic care discussed. Followup as scheduled.

## 2012-06-05 ENCOUNTER — Encounter: Payer: Self-pay | Admitting: *Deleted

## 2012-09-12 ENCOUNTER — Encounter: Payer: Self-pay | Admitting: Gastroenterology

## 2012-10-17 ENCOUNTER — Encounter: Payer: Self-pay | Admitting: *Deleted

## 2012-11-09 ENCOUNTER — Other Ambulatory Visit: Payer: Self-pay | Admitting: Cardiovascular Disease

## 2012-11-09 ENCOUNTER — Ambulatory Visit (INDEPENDENT_AMBULATORY_CARE_PROVIDER_SITE_OTHER): Payer: Managed Care, Other (non HMO) | Admitting: Physician Assistant

## 2012-11-09 ENCOUNTER — Encounter: Payer: Self-pay | Admitting: Physician Assistant

## 2012-11-09 VITALS — BP 130/100 | HR 61 | Ht 73.0 in | Wt 254.0 lb

## 2012-11-09 DIAGNOSIS — R0609 Other forms of dyspnea: Secondary | ICD-10-CM

## 2012-11-09 DIAGNOSIS — R079 Chest pain, unspecified: Secondary | ICD-10-CM

## 2012-11-09 DIAGNOSIS — R0600 Dyspnea, unspecified: Secondary | ICD-10-CM

## 2012-11-09 DIAGNOSIS — R0989 Other specified symptoms and signs involving the circulatory and respiratory systems: Secondary | ICD-10-CM

## 2012-11-09 LAB — PACEMAKER DEVICE OBSERVATION
AL THRESHOLD: 1 V
ATRIAL PACING PM: 48.8
BAMS-0001: 171 {beats}/min
RV LEAD THRESHOLD: 1 V
VENTRICULAR PACING PM: 0.1

## 2012-11-09 NOTE — Progress Notes (Addendum)
Date:  11/09/2012   ID:  [name], DOB [birth year], MRN [id number]  PCP:  Lilyan Punt, MD  Primary Cardiologist:  [facility]    History of Present Illness: [name] is a 57 y.o. male gentleman with a history of neurally-mediated syncope requiring pacemaker implantation. This has fixed the cardioinhibitory component but he still has occasional near syncope due to vasodepressor events. He also has obesity, hypertriglyceridemia, echocardiographic evidence of diastolic dysfunction, and despite the absence of coronary atherosclerosis by angiography he did have evidence of "slow flow" in his coronary arteries suggesting small vessel disease. He has recently been diagnosed with Barrett esophagus.   Patient presents today after having progressive worsening shortness of breath over the last several months.  He says it can occur at random times and will even occur when she is laying awake in bed.  The dyspnea is not a straight worse with exertion.  He also reports of some chest pressure which occurs at random times and has not occurred when he lifts 50 pound bags at work.  He does get some lower extremity edema to clear and ankles which improves by morning.   He denies nausea, vomiting, fever,  orthopnea, dizziness, PND, cough, congestion, abdominal pain, hematochezia, melena, lower extremity edema, claudication.  He works 6 days per week currently 10-12 hours per day during weekdays and 8 hours on the weekend.  Wt Readings from Last 3 Encounters:  11/09/12 254 lb (115.214 kg)  05/04/12 256 lb (116.121 kg)  12/05/11 248 lb (112.492 kg)     Past Medical History  Diagnosis Date  . Pacemaker   . Hypercholesterolemia   . GERD (gastroesophageal reflux disease)   . Syncope   . Hypotension   . Barrett's esophagus     Current Outpatient Prescriptions  Medication Sig Dispense Refill  . aspirin EC 81 MG tablet Take 81 mg by mouth daily.      .  chlorpheniramine-pseudoephedrine-acetaminophen (SINE-OFF) 2-30-500 MG per tablet Take 1 tablet by mouth every 4 (four) hours as needed. Sinus pain, pressure and congestion      . doxycycline (VIBRA-TABS) 100 MG tablet Take 1 tablet (100 mg total) by mouth 2 (two) times daily.  60 tablet  0  . fenofibrate (TRICOR) 145 MG tablet Take 145 mg by mouth daily.      [name] Lysine 500 MG CAPS Take 1 capsule by mouth daily.      . Naproxen Sodium (ALEVE) 220 MG CAPS Take 1 capsule by mouth as needed.      . Omega-3 Fatty Acids (FISH OIL PO) Take 1 tablet by mouth daily. 1200mg       . omeprazole (PRILOSEC) 20 MG capsule Take 1 capsule (20 mg total) by mouth daily.  90 capsule  3   No current facility-administered medications for this visit.    Allergies:    Allergies  Allergen Reactions  . Other Anaphylaxis    GI Cocktail  . Norvasc [Amlodipine Besylate] Swelling  . Sulfonamide Derivatives     REACTION: tongue swells  . Levofloxacin Itching and Rash    Social History:  The patient  reports that he quit smoking about 38 years ago. His smoking use included Cigarettes. He smoked 0.00 packs per day. He has never used smokeless tobacco. He reports that he does not drink alcohol or use illicit drugs.   Family history:   Family History  Problem Relation Age of Onset  . Ovarian cancer Mother   . Heart failure Father   .  Heart attack Sister   . Lymphoma Brother     x 2  . COPD Sister   . Hypertension Brother   . Hyperlipidemia Brother   . Hyperlipidemia Sister   . Colon cancer Neg Hx   . Stomach cancer Neg Hx     ROS:  Please see the history of present illness.  All other systems reviewed and negative.   PHYSICAL EXAM: VS:  BP 130/100  Pulse 61  Ht 6\' 1"  (1.854 m)  Wt 254 lb (115.214 kg)  BMI 33.52 kg/m2 Obese, well developed, in no acute distress HEENT: Pupils are equal round react to light accommodation extraocular movements are intact. Conjunctiva are  pink Neck: no JVDNo cervical  lymphadenopathy. Cardiac: Regular rate and rhythm without murmurs rubs or gallops. Lungs:  clear to auscultation bilaterally, no wheezing, rhonchi or rales Abd: soft, nontender, positive bowel sounds all quadrants,  Ext: no lower extremity edema.  2+ radial and dorsalis pedis pulses.  Good capillary refill. Skin: warm and dry Neuro:  Grossly normal  EKG:  Apaced 61 BPM     ASSESSMENT AND PLAN:  Problem List Items Addressed This Visit   Dyspnea - Primary     Will schedule patient for a 2-D echocardiogram.  I think some of his dyspnea is multifactorial including his obesity, deconditioning and long working hours. He does work fairly 6 days per week 10-12 hours a night shift 5 days a week and then 8 hours and the weekend.  He also is likely not getting adequate sleep during the day when he is home.  It is possible that he has sleep apnea, however, I think poor sleep hygiene is the likely cause.      Relevant Orders      2D Echocardiogram without contrast   Chest pain     Patient has been complaining of tightness in the chest mild at random times and does not occur with exertion. Do not think this is a type of angina continue to monitor.

## 2012-11-09 NOTE — Assessment & Plan Note (Signed)
Patient has been complaining of tightness in the chest mild at random times and does not occur with exertion. Do not think this is a type of angina continue to monitor.

## 2012-11-09 NOTE — Patient Instructions (Signed)
We will schedule you for a echocardiogram.  Follow up with Dr. Royann Shivers in a month or sooner if needed.

## 2012-11-09 NOTE — Assessment & Plan Note (Addendum)
Will schedule patient for a 2-D echocardiogram.  I think some of his dyspnea is multifactorial including his obesity, deconditioning and long working hours. He does work fairly 6 days per week 10-12 hours a night shift 5 days a week and then 8 hours and the weekend.  He also is likely not getting adequate sleep during the day when he is home.  It is possible that he has sleep apnea, however, I think poor sleep hygiene is the likely cause.

## 2012-11-12 ENCOUNTER — Ambulatory Visit (HOSPITAL_COMMUNITY)
Admission: RE | Admit: 2012-11-12 | Discharge: 2012-11-12 | Disposition: A | Discharge status: Home / Self Care | Payer: Managed Care, Other (non HMO) | Source: Ambulatory Visit | Attending: Cardiovascular Disease | Admitting: Cardiovascular Disease

## 2012-11-12 ENCOUNTER — Other Ambulatory Visit: Payer: Managed Care, Other (non HMO) | Admitting: Gastroenterology

## 2012-11-12 ENCOUNTER — Encounter: Payer: Self-pay | Admitting: Cardiovascular Disease

## 2012-11-12 DIAGNOSIS — R0989 Other specified symptoms and signs involving the circulatory and respiratory systems: Secondary | ICD-10-CM | POA: Insufficient documentation

## 2012-11-12 DIAGNOSIS — R0609 Other forms of dyspnea: Secondary | ICD-10-CM | POA: Insufficient documentation

## 2012-11-12 DIAGNOSIS — R0600 Dyspnea, unspecified: Secondary | ICD-10-CM

## 2012-11-12 NOTE — Progress Notes (Signed)
2D Echo Performed 11/12/2012    Anastasiya Gowin, RCS  

## 2012-11-26 ENCOUNTER — Other Ambulatory Visit: Payer: Self-pay | Admitting: Gastroenterology

## 2012-11-26 ENCOUNTER — Telehealth: Payer: Self-pay | Admitting: Cardiovascular Disease

## 2012-11-26 ENCOUNTER — Encounter: Payer: Self-pay | Admitting: Gastroenterology

## 2012-11-26 ENCOUNTER — Telehealth: Payer: Self-pay | Admitting: Gastroenterology

## 2012-11-26 NOTE — Telephone Encounter (Signed)
rx has been sent to the pharmacy. 

## 2012-11-26 NOTE — Telephone Encounter (Signed)
Message send to [place], [place] - Dr. Renaye Rakers patient

## 2012-11-26 NOTE — Telephone Encounter (Signed)
Need to get results of echo done about 2 weeks ago.  Please call

## 2012-11-27 NOTE — Telephone Encounter (Signed)
Still waiting for his Echo results. After 2:00 call him at [phone] please.

## 2012-11-27 NOTE — Telephone Encounter (Signed)
Echo is almost completely normal. Mild right atrial dilatation (sleep apnea?)

## 2012-11-27 NOTE — Telephone Encounter (Signed)
Echo results called to patient.  Voiced understanding. 

## 2012-11-27 NOTE — Telephone Encounter (Signed)
Please read echo of 11/12/12 and send to me w/results to call the patient.  Thanks

## 2012-12-10 ENCOUNTER — Encounter: Payer: Self-pay | Admitting: Cardiovascular Disease

## 2012-12-10 ENCOUNTER — Ambulatory Visit (INDEPENDENT_AMBULATORY_CARE_PROVIDER_SITE_OTHER): Payer: Managed Care, Other (non HMO) | Admitting: Cardiovascular Disease

## 2012-12-10 VITALS — BP 139/89 | HR 73 | Resp 16 | Ht 73.5 in | Wt 257.1 lb

## 2012-12-10 DIAGNOSIS — E785 Hyperlipidemia, unspecified: Secondary | ICD-10-CM

## 2012-12-10 DIAGNOSIS — E782 Mixed hyperlipidemia: Secondary | ICD-10-CM

## 2012-12-10 DIAGNOSIS — R55 Syncope and collapse: Secondary | ICD-10-CM

## 2012-12-10 DIAGNOSIS — Z79899 Other long term (current) drug therapy: Secondary | ICD-10-CM

## 2012-12-10 DIAGNOSIS — G4733 Obstructive sleep apnea (adult) (pediatric): Secondary | ICD-10-CM

## 2012-12-10 DIAGNOSIS — Z950 Presence of cardiac pacemaker: Secondary | ICD-10-CM

## 2012-12-10 NOTE — Patient Instructions (Signed)
Your physician has recommended that you have a sleep study. This test records several body functions during sleep, including: brain activity, eye movement, oxygen and carbon dioxide blood levels, heart rate and rhythm, breathing rate and rhythm, the flow of air through your mouth and nose, snoring, body muscle movements, and chest and belly movement.  Your physician recommends that you return for FASTING lab work in: 3 months -  at least 2 days before you appointment with Dr. Royann Shivers.  Dr. Royann Shivers recommends that you schedule a follow-up appointment in: 3 months.

## 2012-12-16 ENCOUNTER — Ambulatory Visit: Payer: Managed Care, Other (non HMO) | Attending: Cardiovascular Disease | Admitting: Sleep Medicine

## 2012-12-16 ENCOUNTER — Encounter: Payer: Self-pay | Admitting: Cardiovascular Disease

## 2012-12-16 DIAGNOSIS — G4733 Obstructive sleep apnea (adult) (pediatric): Secondary | ICD-10-CM

## 2012-12-16 DIAGNOSIS — R55 Syncope and collapse: Secondary | ICD-10-CM | POA: Insufficient documentation

## 2012-12-16 NOTE — Progress Notes (Signed)
Patient ID: [name], male   DOB: 04/14/1955, 57 y.o.   MRN: [id number]      Reason for office visit Neurocardiogenic syncope, pacemaker, fatigue  Tadarius continues to complain of feeling tired. 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 Nevertheless he does not feel as fresh as he was a couple of years ago. He wakes up feeling tired and often feels he should take a nap in the middle of the day. He has not had any new syncopal events. His pacemaker was interrogated about a month ago when he saw Wilburt Finlay in the clinic. That time he was complaining of worsening shortness of breath and Bryan ordered a repeat echo which almost completely was a normal study. There is a mention of mild diastolic dysfunction but E/e' ratio was low, therefore not supporting congestive heart failure. Note was made of a mildly dilated right atrium.  Allergies  Allergen Reactions  . Other Anaphylaxis    GI Cocktail  . Norvasc [Amlodipine Besylate] Swelling  . Sulfonamide Derivatives     REACTION: tongue swells  . Levofloxacin Itching and Rash    Current Outpatient Prescriptions  Medication Sig Dispense Refill  . aspirin EC 81 MG tablet Take 81 mg by mouth daily.      . chlorpheniramine-pseudoephedrine-acetaminophen (SINE-OFF) 2-30-500 MG per tablet Take 1 tablet by mouth every 4 (four) hours as needed. Sinus pain, pressure and congestion      . fenofibrate (TRICOR) 145 MG tablet Take 145 mg by mouth daily.      . Multiple Vitamin (MULTIVITAMIN) tablet Take 1 tablet by mouth daily.      . Naproxen Sodium (ALEVE) 220 MG CAPS Take 1 capsule by mouth as needed.      . Omega-3 Fatty Acids (FISH OIL PO) Take 1 tablet by mouth daily. 1200mg       . omeprazole (PRILOSEC) 20 MG capsule TAKE 1 CAPSULE (20 MG TOTAL) BY MOUTH DAILY.  90 capsule  3   No current facility-administered medications for this visit.    Past Medical History  Diagnosis Date  . Pacemaker   .  Hypercholesterolemia   . GERD (gastroesophageal reflux disease)   . Syncope   . Hypotension   . Barrett's esophagus     Past Surgical History  Procedure Laterality Date  . Pacemaker insertion  06/08/2010    Medtronic Revo model #RVDR01, serial [id number]  . Cervical spine surgery      plates and screws  . Tonsillectomy    . Bladder surgery      stretched  . Birthmark removal      right thigh  . Wisdom tooth extraction    . Lower extremity arterial doppler  10/02/2006    No evidence of thrombus or thrombphlebitis.  . Cardiac catheterization  09/15/2006    Recommendation - empiric anti-reflux therapy  . Cardiovascular stress test  04/08/2011    No scintigraphic evidence of inducible myocardial ischemia. No lexiscan EKG changes. Non-diagnostic for ischemia.  . Transthoracic echocardiogram  04/06/2011    EF 55-60%, normal    Family History  Problem Relation Age of Onset  . Ovarian cancer Mother   . Heart failure Father   . Heart attack Father   . Heart attack Sister   . Lymphoma Brother     x 2  . COPD Sister   . Hypertension Brother   . Hyperlipidemia Brother   . Hyperlipidemia Sister   . Colon cancer Neg Hx   .  Stomach cancer Neg Hx     History   Social History  . Marital Status: Married    Spouse Name: N/A    Number of Children: 2  . Years of Education: N/A   Occupational History  . warehouse tech [employer]   Social History Main Topics  . Smoking status: Former Smoker    Types: Cigarettes    Quit date: 11/27/1973  . Smokeless tobacco: Never Used  . Alcohol Use: No  . Drug Use: No  . Sexual Activity: Not on file   Other Topics Concern  . Not on file   Social History Narrative  . No narrative on file    Review of systems: The patient specifically denies any chest pain at rest or with exertion, dyspnea at rest or with exertion, orthopnea, paroxysmal nocturnal dyspnea, syncope, palpitations, focal neurological deficits, intermittent claudication,  lower extremity edema, unexplained weight gain, cough, hemoptysis or wheezing.  The patient also denies abdominal pain, nausea, vomiting, dysphagia, diarrhea, constipation, polyuria, polydipsia, dysuria, hematuria, frequency, urgency, abnormal bleeding or bruising, fever, chills, unexpected weight changes, mood swings, change in skin or hair texture, change in voice quality, auditory or visual problems, allergic reactions or rashes, new musculoskeletal complaints other than usual "aches and pains".   PHYSICAL EXAM BP 139/89  Pulse 73  Resp 16  Ht 6' 1.5" (1.867 m)  Wt 257 lb 1.6 oz (116.62 kg)  BMI 33.46 kg/m2  General: Alert, oriented x3, no distress Head: no evidence of trauma, PERRL, EOMI, no exophtalmos or lid lag, no myxedema, no xanthelasma; normal ears, nose and oropharynx Neck: normal jugular venous pulsations and no hepatojugular reflux; brisk carotid pulses without delay and no carotid bruits Chest: clear to auscultation, no signs of consolidation by percussion or palpation, normal fremitus, symmetrical and full respiratory excursions;  Subclavian pacemaker site is healthy Cardiovascular: normal position and quality of the apical impulse, regular rhythm, normal first and second heart sounds, no murmurs, rubs or gallops Abdomen: no tenderness or distention, no masses by palpation, no abnormal pulsatility or arterial bruits, normal bowel sounds, no hepatosplenomegaly Extremities: no clubbing, cyanosis or edema; 2+ radial, ulnar and brachial pulses bilaterally; 2+ right femoral, posterior tibial and dorsalis pedis pulses; 2+ left femoral, posterior tibial and dorsalis pedis pulses; no subclavian or femoral bruits Neurological: grossly nonfocal   EKG:  atrial paced ventricular sensed incomplete right bundle branch block  Lipid Panel  April 2014 total cholesterol 159, triglycerides 89, HDL 33, LDL 108, creatinine 1.25, normal electrolytes liver function tests, normal TSH  BMET      Component Value Date/Time   NA 143 04/06/2011 0625   K 3.8 04/06/2011 0625   CL 107 04/06/2011 0625   CO2 24 04/06/2011 0625   GLUCOSE 90 04/06/2011 0625   BUN 25* 04/06/2011 0625   CREATININE 1.49* 04/06/2011 0625   CALCIUM 10.3 04/06/2011 0625   GFRNONAA 52* 04/06/2011 0625   GFRAA 60* 04/06/2011 0625     ASSESSMENT AND PLAN Neurocardiogenic syncope Despite pacemaker implantation, Mykael has had several more presyncopal episodes all of them occurring in circumstances strongly suggestive of a vagal response. He is able to abort syncope by laying down. He does not have significant structural heart disease. Coronary angiography performed in 2008 did not show any evidence of stenoses. Reviewed the importance of staying well-hydrated, a relatively high sodium diet (he does not have hypertension), avoiding triggers, responding immediately to prodromal symptoms by assuming a horizontal position, avoiding prolonged orthostasis, etc.  Pacemaker, MDT  5/12 for SB Recent pacemaker check on October 6 showed normal findings. Dual-chamber Medtronic device with roughly 40% atrial pacing virtually no ventricular pacing. Histogram distribution appropriate for patient activity level. Patient will follow up remotely on 02-11-2013, and with [facility] in 3 months.   OSA (obstructive sleep apnea) - presumptive diagnosis He is a loud snorer although this is a positional issue. He has numerous features to suggest sleep apnea including daytime hypersomnolence and chronic fatigue. Weight loss is strongly recommended. I recommended that he also have a sleep study.  Dyslipidemia It is reasonable to see if his fatigue improves when he stops fenofibrate temporarily Repeat a lipid profile and followup in the clinic in 3 months.   Orders Placed This Encounter  Procedures  . Comp Met (CMET)  . Lipid Profile  . Split night study   Meds ordered this encounter  Medications  . Multiple Vitamin (MULTIVITAMIN) tablet    Sig: Take 1  tablet by mouth daily.    Junious Silk, MD, [facility] [phone] office [phone] pager

## 2012-12-16 NOTE — Assessment & Plan Note (Addendum)
It is reasonable to see if his fatigue improves when he stops fenofibrate temporarily Repeat a lipid profile and followup in the clinic in 3 months.

## 2012-12-16 NOTE — Assessment & Plan Note (Signed)
Recent pacemaker check on October 6 showed normal findings. Dual-chamber Medtronic device with roughly 40% atrial pacing virtually no ventricular pacing. Histogram distribution appropriate for patient activity level. Patient will follow up remotely on 02-11-2013, and with [facility] in 3 months.

## 2012-12-16 NOTE — Assessment & Plan Note (Signed)
Despite pacemaker implantation, [name] has had several more presyncopal episodes all of them occurring in circumstances strongly suggestive of a vagal response. He is able to abort syncope by laying down. He does not have significant structural heart disease. Coronary angiography performed in 2008 did not show any evidence of stenoses. Reviewed the importance of staying well-hydrated, a relatively high sodium diet (he does not have hypertension), avoiding triggers, responding immediately to prodromal symptoms by assuming a horizontal position, avoiding prolonged orthostasis, etc.

## 2012-12-16 NOTE — Assessment & Plan Note (Signed)
He is a loud snorer although this is a positional issue. He has numerous features to suggest sleep apnea including daytime hypersomnolence and chronic fatigue. Weight loss is strongly recommended. I recommended that he also have a sleep study.

## 2012-12-22 NOTE — Procedures (Signed)
[facility] Jameika Kinn A. Gerilyn Pilgrim, MD     [email or web address]        NAMEZEKIEL, TORIAN                ACCOUNT NO.:  [id number]  MEDICAL RECORD NO.:  [id number]          PATIENT TYPE:  OUT  LOCATION:  SLEEP LAB                     FACILITY:  [facility]  PHYSICIAN:  Ailany Koren A. Gerilyn Pilgrim, M.D. DATE OF BIRTH:  05/16/1955  DATE OF STUDY:  12/16/2012                           NOCTURNAL POLYSOMNOGRAM  REFERRING PHYSICIAN:  Thurmon Fair, MD  INDICATION:  A 57 year old man, who presents with fatigue, obesity, and snoring.   MEDICATIONS:  Aspirin, omeprazole, omega-3, multivitamins.  EPWORTH SLEEPINESS SCALE:  3.  BMI:  33.  ARCHITECTURAL SUMMARY:  The total recording time is 3 and is correction 417 minutes, sleep efficiency 88%, sleep latency 7 minute.  REM latency 52 minutes.  Stage N1 5%, N2 49%, N3 21%, and REM sleep 25%.  RESPIRATORY SUMMARY:  Baseline oxygen saturation is 95, lowest saturation 86 during non-REM sleep.  Diagnostic AHI is 8.  The events occurred almost exclusively in the supine position with a supine AHI of 48.  LIMB MOVEMENT SUMMARY:  PLM index 0.  ELECTROCARDIOGRAM SUMMARY:  Average heart rate is 63 with no significant dysrhythmias observed.  IMPRESSION:  Mostly supine related mild obstructive sleep apnea syndrome.  I recommend positional therapy.  The severity does not requiring positive pressure treatment.  Thanks for this referral.     Joselynn Amoroso A. Gerilyn Pilgrim, M.D.    KAD/MEDQ  D:  12/22/2012 17:50:29  T:  12/22/2012 18:15:43  Job:  [id number]

## 2012-12-26 ENCOUNTER — Telehealth: Payer: Self-pay | Admitting: *Deleted

## 2012-12-26 NOTE — Telephone Encounter (Signed)
LMTC to get sleep study results.  Mild sleep apnea -CPAP therapy not indicated.  Recommend position changes, sleep on sides, maybe try a body pillow.

## 2012-12-27 NOTE — Telephone Encounter (Signed)
Patient called this morning, discussed Sleep Study results.  Voiced understanding.

## 2013-01-12 ENCOUNTER — Encounter (HOSPITAL_COMMUNITY): Payer: Self-pay | Admitting: Emergency Medicine

## 2013-01-12 ENCOUNTER — Emergency Department (HOSPITAL_COMMUNITY)
Admission: EM | Admit: 2013-01-12 | Discharge: 2013-01-13 | Disposition: A | Discharge status: Home / Self Care | Payer: Managed Care, Other (non HMO) | Attending: Emergency Medicine | Admitting: Emergency Medicine

## 2013-01-12 DIAGNOSIS — Z87891 Personal history of nicotine dependence: Secondary | ICD-10-CM | POA: Insufficient documentation

## 2013-01-12 DIAGNOSIS — R5381 Other malaise: Secondary | ICD-10-CM | POA: Insufficient documentation

## 2013-01-12 DIAGNOSIS — R51 Headache: Secondary | ICD-10-CM | POA: Insufficient documentation

## 2013-01-12 DIAGNOSIS — Z950 Presence of cardiac pacemaker: Secondary | ICD-10-CM | POA: Insufficient documentation

## 2013-01-12 DIAGNOSIS — Z8679 Personal history of other diseases of the circulatory system: Secondary | ICD-10-CM | POA: Insufficient documentation

## 2013-01-12 DIAGNOSIS — Z7982 Long term (current) use of aspirin: Secondary | ICD-10-CM | POA: Insufficient documentation

## 2013-01-12 DIAGNOSIS — K219 Gastro-esophageal reflux disease without esophagitis: Secondary | ICD-10-CM | POA: Insufficient documentation

## 2013-01-12 DIAGNOSIS — R110 Nausea: Secondary | ICD-10-CM | POA: Insufficient documentation

## 2013-01-12 DIAGNOSIS — Z95818 Presence of other cardiac implants and grafts: Secondary | ICD-10-CM | POA: Insufficient documentation

## 2013-01-12 DIAGNOSIS — Z79899 Other long term (current) drug therapy: Secondary | ICD-10-CM | POA: Insufficient documentation

## 2013-01-12 DIAGNOSIS — D696 Thrombocytopenia, unspecified: Secondary | ICD-10-CM | POA: Insufficient documentation

## 2013-01-12 DIAGNOSIS — E7800 Pure hypercholesterolemia, unspecified: Secondary | ICD-10-CM | POA: Insufficient documentation

## 2013-01-12 DIAGNOSIS — B349 Viral infection, unspecified: Secondary | ICD-10-CM

## 2013-01-12 DIAGNOSIS — B9789 Other viral agents as the cause of diseases classified elsewhere: Secondary | ICD-10-CM | POA: Insufficient documentation

## 2013-01-12 MED ORDER — ACETAMINOPHEN 325 MG PO TABS
650.0000 mg | ORAL_TABLET | Freq: Once | ORAL | Status: AC
  Administered 2013-01-12: 650 mg via ORAL
  Filled 2013-01-12: qty 2

## 2013-01-12 NOTE — ED Notes (Signed)
Chills, low grade fever for 24 hours with generalized body aches and headache with occasional nausea

## 2013-01-13 LAB — CBC WITH DIFFERENTIAL/PLATELET
Eosinophils Absolute: 0 10*3/uL (ref 0.0–0.7)
Eosinophils Relative: 0 % (ref 0–5)
HCT: 38.7 % — ABNORMAL LOW (ref 39.0–52.0)
Hemoglobin: 13.9 g/dL (ref 13.0–17.0)
Lymphs Abs: 0.9 10*3/uL (ref 0.7–4.0)
MCH: 32.8 pg (ref 26.0–34.0)
MCHC: 35.9 g/dL (ref 30.0–36.0)
MCV: 91.3 fL (ref 78.0–100.0)
Monocytes Absolute: 0.3 10*3/uL (ref 0.1–1.0)
Monocytes Relative: 9 % (ref 3–12)
Platelets: 103 10*3/uL — ABNORMAL LOW (ref 150–400)
RBC: 4.24 MIL/uL (ref 4.22–5.81)

## 2013-01-13 LAB — INFLUENZA PANEL BY PCR (TYPE A & B)
H1N1 flu by pcr: NEGATIVE — AB
Influenza A By PCR: NEGATIVE
Influenza B By PCR: NEGATIVE — AB

## 2013-01-13 LAB — URINALYSIS, ROUTINE W REFLEX MICROSCOPIC
Glucose, UA: NEGATIVE mg/dL
Ketones, ur: NEGATIVE mg/dL
Leukocytes, UA: NEGATIVE
Nitrite: NEGATIVE
Specific Gravity, Urine: 1.02 (ref 1.005–1.030)
pH: 7 (ref 5.0–8.0)

## 2013-01-13 LAB — BASIC METABOLIC PANEL
CO2: 25 mEq/L (ref 19–32)
Calcium: 9 mg/dL (ref 8.4–10.5)
Creatinine, Ser: 1.26 mg/dL (ref 0.50–1.35)
GFR calc non Af Amer: 63 mL/min — ABNORMAL LOW (ref >90)
Potassium: 3.5 mEq/L (ref 3.5–5.1)
Sodium: 138 mEq/L (ref 135–145)

## 2013-01-13 MED ORDER — KETOROLAC TROMETHAMINE 60 MG/2ML IM SOLN
60.0000 mg | Freq: Once | INTRAMUSCULAR | Status: AC
  Administered 2013-01-13: 60 mg via INTRAMUSCULAR
  Filled 2013-01-13: qty 2

## 2013-01-13 MED ORDER — ONDANSETRON HCL 8 MG PO TABS: 8.0000 mg | ORAL_TABLET | Freq: Three times a day (TID) | ORAL | Status: DC | PRN

## 2013-01-13 NOTE — ED Provider Notes (Signed)
CSN: [id number]     Arrival date & time 01/12/13  2231 History   First MD Initiated Contact with Patient 01/12/13 2308     Chief Complaint  Patient presents with  . Generalized Body Aches   (Consider location/radiation/quality/duration/timing/severity/associated sxs/prior Treatment) HPI Comments: [name] is a 57 y.o. Male with  Generalized fatigue, body aches, subjective fever with headache,  Nausea and decreased appetite since yesterday.  Wife (who is an Charity fundraiser) at bedside also endorses nausea but she has had no fevers or chills.  He denies dizziness,  Uri symptoms, cough, chest pain, sob, abdominal pain, diarrhea or constipation and has had no dysuria.  He last took an aleve tablet around 10 am with transient improvement in symptoms. He has not had a flu vaccine this season.     The history is provided by the patient.    Past Medical History  Diagnosis Date  . Pacemaker   . Hypercholesterolemia   . GERD (gastroesophageal reflux disease)   . Syncope   . Hypotension   . Barrett's esophagus    Past Surgical History  Procedure Laterality Date  . Pacemaker insertion  06/08/2010    Medtronic Revo model #RVDR01, serial [id number]  . Cervical spine surgery      plates and screws  . Tonsillectomy    . Bladder surgery      stretched  . Birthmark removal      right thigh  . Wisdom tooth extraction    . Lower extremity arterial doppler  10/02/2006    No evidence of thrombus or thrombphlebitis.  . Cardiac catheterization  09/15/2006    Recommendation - empiric anti-reflux therapy  . Cardiovascular stress test  04/08/2011    No scintigraphic evidence of inducible myocardial ischemia. No lexiscan EKG changes. Non-diagnostic for ischemia.  . Transthoracic echocardiogram  04/06/2011    EF 55-60%, normal   Family History  Problem Relation Age of Onset  . Ovarian cancer Mother   . Heart failure Father   . Heart attack Father   . Heart attack Sister   . Lymphoma Brother     x 2  .  COPD Sister   . Hypertension Brother   . Hyperlipidemia Brother   . Hyperlipidemia Sister   . Colon cancer Neg Hx   . Stomach cancer Neg Hx    History  Substance Use Topics  . Smoking status: Former Smoker    Types: Cigarettes    Quit date: 11/27/1973  . Smokeless tobacco: Never Used  . Alcohol Use: No    Review of Systems  Constitutional: Positive for fever and chills.  HENT: Negative for congestion, rhinorrhea and sore throat.   Eyes: Negative.   Respiratory: Negative for chest tightness and shortness of breath.   Cardiovascular: Negative for chest pain.  Gastrointestinal: Negative for nausea and abdominal pain.  Genitourinary: Negative.   Musculoskeletal: Positive for myalgias. Negative for arthralgias, joint swelling and neck pain.  Skin: Negative.  Negative for rash and wound.  Neurological: Negative for dizziness, weakness, light-headedness, numbness and headaches.  Psychiatric/Behavioral: Negative.     Allergies  Other; Norvasc; Sulfonamide derivatives; and Levofloxacin  Home Medications   Current Outpatient Rx  Name  Route  Sig  Dispense  Refill  . aspirin EC 81 MG tablet   Oral   Take 81 mg by mouth daily.         . chlorpheniramine-pseudoephedrine-acetaminophen (SINE-OFF) 2-30-500 MG per tablet   Oral   Take 1 tablet by mouth every  4 (four) hours as needed. Sinus pain, pressure and congestion         . fenofibrate (TRICOR) 145 MG tablet   Oral   Take 145 mg by mouth daily.         . Multiple Vitamin (MULTIVITAMIN) tablet   Oral   Take 1 tablet by mouth daily.         . Naproxen Sodium (ALEVE) 220 MG CAPS   Oral   Take 1 capsule by mouth as needed.         . Omega-3 Fatty Acids (FISH OIL PO)   Oral   Take 1 tablet by mouth daily. 1200mg          . omeprazole (PRILOSEC) 20 MG capsule      TAKE 1 CAPSULE (20 MG TOTAL) BY MOUTH DAILY.   90 capsule   3   . ondansetron (ZOFRAN) 8 MG tablet   Oral   Take 1 tablet (8 mg total) by mouth  every 8 (eight) hours as needed for nausea.   12 tablet   0    BP 115/66  Pulse 77  Temp(Src) 100.9 F (38.3 C) (Oral)  Resp 16  Ht 6\' 1"  (1.854 m)  Wt 253 lb (114.76 kg)  BMI 33.39 kg/m2  SpO2 95% Physical Exam  Nursing note and vitals reviewed. Constitutional: He appears well-developed and well-nourished. No distress.  Fever 101.8.  HENT:  Head: Normocephalic and atraumatic.  Right Ear: External ear normal.  Left Ear: External ear normal.  Mouth/Throat: Oropharynx is clear and moist.  Eyes: Conjunctivae are normal.  Neck: Normal range of motion.  Cardiovascular: Normal rate, regular rhythm, normal heart sounds and intact distal pulses.   Pulmonary/Chest: Effort normal and breath sounds normal. No respiratory distress. He has no wheezes. He has no rhonchi. He has no rales.  Abdominal: Soft. Bowel sounds are normal. There is no tenderness. There is no guarding.  Musculoskeletal: Normal range of motion.  Neurological: He is alert.  Skin: Skin is warm and dry. No rash noted.  Psychiatric: He has a normal mood and affect.    ED Course  Procedures (including critical care time) Labs Review Labs Reviewed  URINALYSIS, ROUTINE W REFLEX MICROSCOPIC - Abnormal; Notable for the following:    Urobilinogen, UA 2.0 (*)    All other components within normal limits  INFLUENZA PANEL BY PCR - Abnormal; Notable for the following:    Influenza B By PCR NEGATIVE (*)    H1N1 flu by pcr NEGATIVE (*)    All other components within normal limits  BASIC METABOLIC PANEL - Abnormal; Notable for the following:    Glucose, Bld 113 (*)    GFR calc non Af Amer 63 (*)    GFR calc Af Amer 73 (*)    All other components within normal limits  CBC WITH DIFFERENTIAL - Abnormal; Notable for the following:    WBC 3.8 (*)    HCT 38.7 (*)    Platelets 103 (*)    All other components within normal limits   Imaging Review   Results for orders placed during the hospital encounter of 01/12/13   URINALYSIS, ROUTINE W REFLEX MICROSCOPIC      Result Value Range   Color, Urine YELLOW  YELLOW   APPearance CLEAR  CLEAR   Specific Gravity, Urine 1.020  1.005 - 1.030   pH 7.0  5.0 - 8.0   Glucose, UA NEGATIVE  NEGATIVE mg/dL   Hgb urine dipstick NEGATIVE  NEGATIVE   Bilirubin Urine NEGATIVE  NEGATIVE   Ketones, ur NEGATIVE  NEGATIVE mg/dL   Protein, ur NEGATIVE  NEGATIVE mg/dL   Urobilinogen, UA 2.0 (*) 0.0 - 1.0 mg/dL   Nitrite NEGATIVE  NEGATIVE   Leukocytes, UA NEGATIVE  NEGATIVE  INFLUENZA PANEL BY PCR      Result Value Range   Influenza A By PCR NEGATIVE  NEGATIVE   Influenza B By PCR NEGATIVE (*) NEGATIVE   H1N1 flu by pcr NEGATIVE (*) NOT DETECTED  BASIC METABOLIC PANEL      Result Value Range   Sodium 138  135 - 145 mEq/L   Potassium 3.5  3.5 - 5.1 mEq/L   Chloride 104  96 - 112 mEq/L   CO2 25  19 - 32 mEq/L   Glucose, Bld 113 (*) 70 - 99 mg/dL   BUN 13  6 - 23 mg/dL   Creatinine, Ser 1.61  0.50 - 1.35 mg/dL   Calcium 9.0  8.4 - 09.6 mg/dL   GFR calc non Af Amer 63 (*) >90 mL/min   GFR calc Af Amer 73 (*) >90 mL/min  CBC WITH DIFFERENTIAL      Result Value Range   WBC 3.8 (*) 4.0 - 10.5 K/uL   RBC 4.24  4.22 - 5.81 MIL/uL   Hemoglobin 13.9  13.0 - 17.0 g/dL   HCT 04.5 (*) 40.9 - 81.1 %   MCV 91.3  78.0 - 100.0 fL   MCH 32.8  26.0 - 34.0 pg   MCHC 35.9  30.0 - 36.0 g/dL   RDW 91.4  78.2 - 95.6 %   Platelets 103 (*) 150 - 400 K/uL   Neutrophils Relative % 68  43 - 77 %   Neutro Abs 2.6  1.7 - 7.7 K/uL   Lymphocytes Relative 23  12 - 46 %   Lymphs Abs 0.9  0.7 - 4.0 K/uL   Monocytes Relative 9  3 - 12 %   Monocytes Absolute 0.3  0.1 - 1.0 K/uL   Eosinophils Relative 0  0 - 5 %   Eosinophils Absolute 0.0  0.0 - 0.7 K/uL   Basophils Relative 0  0 - 1 %   Basophils Absolute 0.0  0.0 - 0.1 K/uL   Smear Review SPECIMEN CHECKED FOR CLOTS     No results found.    EKG Interpretation   None       MDM   1. Thrombocytopenia   2. Acute viral syndrome     Patients labs and/or radiological studies were viewed and considered during the medical decision making and disposition process. Pt advised rest,  Increased fluid intake, tylenol/aleve for sx relief.  Recheck by pcp if not improving or for any worsened sx.  Advised recheck cbc in 1 week to recheck platelet count.  The patient appears reasonably screened and/or stabilized for discharge and I doubt any other medical condition or other [facility] requiring further screening, evaluation, or treatment in the ED at this time prior to discharge.     Burgess Amor, PA-C 01/13/13 [phone]

## 2013-01-13 NOTE — ED Provider Notes (Signed)
Medical screening examination/treatment/procedure(s) were performed by non-physician practitioner and as supervising physician I was immediately available for consultation/collaboration.   Dione Booze, MD 01/13/13 [phone]

## 2013-01-13 NOTE — ED Notes (Signed)
Please ignore care handoff note. Made in error.

## 2013-01-18 ENCOUNTER — Telehealth: Payer: Self-pay | Admitting: Family Medicine

## 2013-01-18 ENCOUNTER — Other Ambulatory Visit: Payer: Self-pay | Admitting: Nurse Practitioner

## 2013-01-18 DIAGNOSIS — D696 Thrombocytopenia, unspecified: Secondary | ICD-10-CM

## 2013-01-18 NOTE — Telephone Encounter (Signed)
Pt has hospital follow up scheduled 01/21/13 with Eber Jones, hospital states pt needs platelets and white count checked at follow up, they were low at hospital.  Can we order labs for pt to get done tomorrow morning for his follow up here Monday morning.

## 2013-01-18 NOTE — Progress Notes (Signed)
Patient notified

## 2013-01-21 ENCOUNTER — Encounter: Payer: Self-pay | Admitting: Nurse Practitioner

## 2013-01-21 ENCOUNTER — Ambulatory Visit (INDEPENDENT_AMBULATORY_CARE_PROVIDER_SITE_OTHER): Payer: Managed Care, Other (non HMO) | Admitting: Nurse Practitioner

## 2013-01-21 VITALS — BP 144/84 | Temp 98.4°F | Ht 73.0 in | Wt 256.0 lb

## 2013-01-21 DIAGNOSIS — R5381 Other malaise: Secondary | ICD-10-CM

## 2013-01-21 DIAGNOSIS — D696 Thrombocytopenia, unspecified: Secondary | ICD-10-CM

## 2013-01-21 LAB — CBC WITH DIFFERENTIAL/PLATELET
Basophils Absolute: 0 10*3/uL (ref 0.0–0.1)
Basophils Relative: 0 % (ref 0–1)
Eosinophils Relative: 1 % (ref 0–5)
HCT: 40.5 % (ref 39.0–52.0)
Hemoglobin: 14.3 g/dL (ref 13.0–17.0)
MCH: 32.3 pg (ref 26.0–34.0)
MCHC: 35.3 g/dL (ref 30.0–36.0)
MCV: 91.4 fL (ref 78.0–100.0)
Monocytes Absolute: 0.4 10*3/uL (ref 0.1–1.0)
Monocytes Relative: 5 % (ref 3–12)
Neutro Abs: 4.2 10*3/uL (ref 1.7–7.7)
Platelets: 210 10*3/uL (ref 150–400)
RDW: 13.6 % (ref 11.5–15.5)

## 2013-01-23 ENCOUNTER — Encounter: Payer: Self-pay | Admitting: Nurse Practitioner

## 2013-01-23 NOTE — Progress Notes (Signed)
Subjective:  Presents for recheck after being seen at the hospital for thrombocytopenia due to a viral illness. All of his symptoms have resolved. No fever. No chills. No unexplained weight loss. Has had an increase fatigued over the past month. Gets routine followup with his cardiologist including lab work.  Objective:   BP 144/84  Temp(Src) 98.4 F (36.9 C) (Oral)  Ht 6\' 1"  (1.854 m)  Wt 256 lb (116.121 kg)  BMI 33.78 kg/m2 NAD. Alert, oriented. Thyroid normal limit to palpation and nontender. Lungs clear. Heart regular rate rhythm.  Assessment:Other malaise and fatigue - Plan: Testosterone  Thrombocytopenia  most likely related to viral illness  Plan: CBC with differential pending. Will add testosterone level since this has not been checked. Further followup based on test results.

## 2013-02-12 ENCOUNTER — Ambulatory Visit (AMBULATORY_SURGERY_CENTER): Payer: Self-pay

## 2013-02-12 VITALS — Ht 73.0 in | Wt 250.0 lb

## 2013-02-12 DIAGNOSIS — K2270 Barrett's esophagus without dysplasia: Secondary | ICD-10-CM

## 2013-02-13 ENCOUNTER — Encounter: Payer: Self-pay | Admitting: Cardiovascular Disease

## 2013-02-15 ENCOUNTER — Encounter: Payer: Self-pay | Admitting: Gastroenterology

## 2013-02-19 LAB — LIPID PANEL
Cholesterol: 124 mg/dL (ref 0–200)
HDL: 26 mg/dL — ABNORMAL LOW (ref >39)
LDL Cholesterol: 61 mg/dL (ref 0–99)
Total CHOL/HDL Ratio: 4.8 Ratio
Triglycerides: 184 mg/dL — ABNORMAL HIGH (ref <150)
VLDL: 37 mg/dL (ref 0–40)

## 2013-02-19 LAB — COMPREHENSIVE METABOLIC PANEL
ALT: 31 U/L (ref 0–53)
AST: 24 U/L (ref 0–37)
Albumin: 4 g/dL (ref 3.5–5.2)
Alkaline Phosphatase: 79 U/L (ref 39–117)
BUN: 17 mg/dL (ref 6–23)
CO2: 25 mEq/L (ref 19–32)
Calcium: 9.3 mg/dL (ref 8.4–10.5)
Chloride: 108 mEq/L (ref 96–112)
Creat: 0.97 mg/dL (ref 0.50–1.35)
Glucose, Bld: 89 mg/dL (ref 70–99)
Potassium: 3.8 mEq/L (ref 3.5–5.3)
Sodium: 142 mEq/L (ref 135–145)
Total Bilirubin: 0.4 mg/dL (ref 0.3–1.2)
Total Protein: 6 g/dL (ref 6.0–8.3)

## 2013-02-24 LAB — PACEMAKER DEVICE OBSERVATION

## 2013-02-25 ENCOUNTER — Ambulatory Visit (INDEPENDENT_AMBULATORY_CARE_PROVIDER_SITE_OTHER): Payer: Managed Care, Other (non HMO) | Admitting: *Deleted

## 2013-02-25 DIAGNOSIS — I498 Other specified cardiac arrhythmias: Secondary | ICD-10-CM

## 2013-02-25 LAB — MDC_IDC_ENUM_SESS_TYPE_REMOTE
Battery Voltage: 3 V
Brady Statistic AP VS Percent: 40.7 %
Brady Statistic AS VP Percent: 0.1 % — CL
Lead Channel Impedance Value: 456 Ohm
Lead Channel Sensing Intrinsic Amplitude: 16.9 mV
Lead Channel Setting Pacing Amplitude: 2 V
Lead Channel Setting Pacing Amplitude: 2 V
Lead Channel Setting Sensing Sensitivity: 0.9 mV
MDC IDC MSMT LEADCHNL RA SENSING INTR AMPL: 2.4 mV
MDC IDC MSMT LEADCHNL RV IMPEDANCE VALUE: 512 Ohm
MDC IDC SET LEADCHNL RV PACING PULSEWIDTH: 0.4 ms
MDC IDC STAT BRADY AP VP PERCENT: 0.1 % — AB
MDC IDC STAT BRADY AS VS PERCENT: 59.3 %
Zone Setting Detection Interval: 350 ms
Zone Setting Detection Interval: 430 ms

## 2013-02-27 ENCOUNTER — Telehealth: Payer: Self-pay | Admitting: Cardiovascular Disease

## 2013-02-27 NOTE — Telephone Encounter (Signed)
Please advise on labs of 02/18/13.

## 2013-02-27 NOTE — Telephone Encounter (Signed)
Forwarded to Union Pacific CorporationBarbara Lassiter

## 2013-02-27 NOTE — Telephone Encounter (Signed)
Want lab results from 02-18-13 please.

## 2013-02-27 NOTE — Telephone Encounter (Signed)
All labs look great! 

## 2013-02-27 NOTE — Telephone Encounter (Signed)
Lab results called and discussed with patient.  Voiced understanding.

## 2013-03-04 ENCOUNTER — Ambulatory Visit (AMBULATORY_SURGERY_CENTER): Payer: Managed Care, Other (non HMO) | Admitting: Gastroenterology

## 2013-03-04 ENCOUNTER — Encounter: Payer: Self-pay | Admitting: Gastroenterology

## 2013-03-04 VITALS — BP 130/90 | HR 60 | Temp 97.0°F | Resp 14 | Ht 73.0 in | Wt 250.0 lb

## 2013-03-04 DIAGNOSIS — K2270 Barrett's esophagus without dysplasia: Secondary | ICD-10-CM

## 2013-03-04 MED ORDER — SODIUM CHLORIDE 0.9 % IV SOLN: 500.0000 mL | INTRAVENOUS | Status: DC

## 2013-03-04 NOTE — Patient Instructions (Signed)
Continue your Omeprazole.   YOU HAD AN ENDOSCOPIC PROCEDURE TODAY AT THE [facility]: Refer to the procedure report that was given to you for any specific questions about what was found during the examination.  If the procedure report does not answer your questions, please call your gastroenterologist to clarify.  If you requested that your care partner not be given the details of your procedure findings, then the procedure report has been included in a sealed envelope for you to review at your convenience later.  YOU SHOULD EXPECT: Some feelings of bloating in the abdomen. Passage of more gas than usual.  Walking can help get rid of the air that was put into your GI tract during the procedure and reduce the bloating. If you had a lower endoscopy (such as a colonoscopy or flexible sigmoidoscopy) you may notice spotting of blood in your stool or on the toilet paper. If you underwent a bowel prep for your procedure, then you may not have a normal bowel movement for a few days.  DIET: Your first meal following the procedure should be a light meal and then it is ok to progress to your normal diet.  A half-sandwich or bowl of soup is an example of a good first meal.  Heavy or fried foods are harder to digest and may make you feel nauseous or bloated.  Likewise meals heavy in dairy and vegetables can cause extra gas to form and this can also increase the bloating.  Drink plenty of fluids but you should avoid alcoholic beverages for 24 hours.  ACTIVITY: Your care partner should take you home directly after the procedure.  You should plan to take it easy, moving slowly for the rest of the day.  You can resume normal activity the day after the procedure however you should NOT DRIVE or use heavy machinery for 24 hours (because of the sedation medicines used during the test).    SYMPTOMS TO REPORT IMMEDIATELY: A gastroenterologist can be reached at any hour.  During normal business hours, 8:30 AM to  5:00 PM Monday through Friday, call [phone].  After hours and on weekends, please call the GI answering service at [phone] who will take a message and have the physician on call contact you.  Following upper endoscopy (EGD)    Vomiting of blood or coffee ground material  New chest pain or pain under the shoulder blades  Painful or persistently difficult swallowing  New shortness of breath  Fever of 100F or higher  Black, tarry-looking stools  FOLLOW UP: If any biopsies were taken you will be contacted by phone or by letter within the next 1-3 weeks.  Call your gastroenterologist if you have not heard about the biopsies in 3 weeks.  Our staff will call the home number listed on your records the next business day following your procedure to check on you and address any questions or concerns that you may have at that time regarding the information given to you following your procedure. This is a courtesy call and so if there is no answer at the home number and we have not heard from you through the emergency physician on call, we will assume that you have returned to your regular daily activities without incident.  SIGNATURES/CONFIDENTIALITY: You and/or your care partner have signed paperwork which will be entered into your electronic medical record.  These signatures attest to the fact that that the information above on your After Visit Summary has been reviewed  and is understood.  Full responsibility of the confidentiality of this discharge information lies with you and/or your care-partner. 

## 2013-03-04 NOTE — Op Note (Signed)
[facility] [address] [place], [id number]   ENDOSCOPY PROCEDURE REPORT  PATIENT: [name], Steven D.  MR#: [id number] BIRTHDATE: Nov 06, 1955 , 58  yrs. old GENDER: Male ENDOSCOPIST: Rachael Feeaniel P Jacobs, MD PROCEDURE DATE:  03/04/2013 PROCEDURE:  EGD w/ biopsy ASA CLASS:     Class II INDICATIONS:  Barrett's without dysplasia, first noted 11/2011 EGD (short segment). MEDICATIONS: Fentanyl 75 mcg IV, Versed 8 mg IV, and These medications were titrated to patient response per physician's verbal order TOPICAL ANESTHETIC: none  DESCRIPTION OF PROCEDURE: After the risks benefits and alternatives of the procedure were thoroughly explained, informed consent was obtained.  The LB [id number] endoscope was introduced through the mouth and advanced to the second portion of the duodenum. Without limitations.  The instrument was slowly withdrawn as the mucosa was fully examined.    The Z-line was irregular without any nodularity.  Pevious biopsies (2013) proved this to be short segment Barrett's and so repeat biopsies taken today.  The examination was otherwise normal. Retroflexed views revealed no abnormalities.     The scope was then withdrawn from the patient and the procedure completed. COMPLICATIONS: There were no complications.  ENDOSCOPIC IMPRESSION: The Z-line was irregular without any nodularity.  Pevious biopsies (2013) proved this to be short segment Barrett's and so repeat biopsies taken today.  The examination was otherwise normal.  RECOMMENDATIONS: Await biopsy results.  You will likely need repeat EGD in 3 years. Continue taking once daily omeprazole to control your GERD symptoms   eSigned:  Rachael Feeaniel P Jacobs, MD 03/04/2013 9:14 AM   CC: Lilyan PuntScott Luking, MD

## 2013-03-04 NOTE — Progress Notes (Signed)
Has had throat swelling and difficulty breathing with GI cocktail, 2 1/2 years ago; will defer cetacaine spray.

## 2013-03-05 ENCOUNTER — Telehealth: Payer: Self-pay | Admitting: *Deleted

## 2013-03-05 NOTE — Telephone Encounter (Signed)
  Follow up Call-  Call back number 03/04/2013 12/05/2011  Post procedure Call Back phone  # [phone]  Permission to leave phone message Yes Yes     Patient questions:  Do you have a fever, pain , or abdominal swelling? no Pain Score  0 *  Have you tolerated food without any problems? yes  Have you been able to return to your normal activities? yes  Do you have any questions about your discharge instructions: Diet   no Medications  no Follow up visit  no  Do you have questions or concerns about your Care? no  Actions: * If pain score is 4 or above: No action needed, pain <4.

## 2013-03-07 ENCOUNTER — Encounter: Payer: Self-pay | Admitting: Gastroenterology

## 2013-03-14 ENCOUNTER — Encounter: Payer: Self-pay | Admitting: *Deleted

## 2013-04-10 ENCOUNTER — Encounter: Payer: Self-pay | Admitting: *Deleted

## 2013-05-02 ENCOUNTER — Telehealth: Payer: Self-pay | Admitting: Cardiovascular Disease

## 2013-05-02 NOTE — Telephone Encounter (Signed)
Got letter regarding and phone call regarding pacemaker check appt.  He doesn't think its time for him to have one and stated that he is not having any problems.  Wants someone to call him regarding this.  Is doing remote pacer checks.  Please call

## 2013-05-10 NOTE — Telephone Encounter (Signed)
[facility]. Patient aware.

## 2013-05-10 NOTE — Telephone Encounter (Signed)
[name]  Is returning your call .[name]. Thanks

## 2013-05-22 ENCOUNTER — Other Ambulatory Visit: Payer: Self-pay | Admitting: *Deleted

## 2013-05-22 MED ORDER — FENOFIBRATE 145 MG PO TABS: 145.0000 mg | ORAL_TABLET | Freq: Every day | ORAL | Status: DC

## 2013-05-22 NOTE — Telephone Encounter (Signed)
Rx refill sent to patient pharmacy   

## 2013-05-29 ENCOUNTER — Ambulatory Visit (INDEPENDENT_AMBULATORY_CARE_PROVIDER_SITE_OTHER): Payer: Managed Care, Other (non HMO) | Admitting: *Deleted

## 2013-05-29 DIAGNOSIS — R55 Syncope and collapse: Secondary | ICD-10-CM

## 2013-05-30 LAB — MDC_IDC_ENUM_SESS_TYPE_REMOTE
Battery Voltage: 3 V
Brady Statistic AP VS Percent: 50.1 %
Lead Channel Impedance Value: 448 Ohm
Lead Channel Impedance Value: 520 Ohm
Lead Channel Sensing Intrinsic Amplitude: 1.8 mV
Lead Channel Sensing Intrinsic Amplitude: 14.6 mV
Lead Channel Setting Pacing Amplitude: 2 V
Lead Channel Setting Pacing Amplitude: 2 V
Lead Channel Setting Pacing Pulse Width: 0.4 ms
Lead Channel Setting Sensing Sensitivity: 0.9 mV
MDC IDC STAT BRADY AP VP PERCENT: 0.1 %
MDC IDC STAT BRADY AS VP PERCENT: 0.1 %
MDC IDC STAT BRADY AS VS PERCENT: 49.9 %
Zone Setting Detection Interval: 350 ms
Zone Setting Detection Interval: 430 ms

## 2013-06-06 ENCOUNTER — Encounter: Payer: Self-pay | Admitting: *Deleted

## 2013-06-08 ENCOUNTER — Encounter (HOSPITAL_COMMUNITY): Payer: Self-pay | Admitting: Emergency Medicine

## 2013-06-08 ENCOUNTER — Emergency Department (HOSPITAL_COMMUNITY)
Admission: EM | Admit: 2013-06-08 | Discharge: 2013-06-08 | Disposition: A | Discharge status: Home / Self Care | Payer: Managed Care, Other (non HMO) | Source: Home / Self Care | Attending: Family Medicine | Admitting: Family Medicine

## 2013-06-08 ENCOUNTER — Emergency Department (INDEPENDENT_AMBULATORY_CARE_PROVIDER_SITE_OTHER): Payer: Managed Care, Other (non HMO)

## 2013-06-08 DIAGNOSIS — S93409A Sprain of unspecified ligament of unspecified ankle, initial encounter: Secondary | ICD-10-CM

## 2013-06-08 DIAGNOSIS — X58XXXA Exposure to other specified factors, initial encounter: Secondary | ICD-10-CM

## 2013-06-08 MED ORDER — HYDROCODONE-ACETAMINOPHEN 5-325 MG PO TABS: 1.0000 | ORAL_TABLET | Freq: Four times a day (QID) | ORAL | Status: DC | PRN

## 2013-06-08 NOTE — ED Provider Notes (Signed)
CSN: [id number]     Arrival date & time 06/08/13  1833 History   First MD Initiated Contact with Patient 06/08/13 1915     Chief Complaint  Patient presents with  . Ankle Injury   (Consider location/radiation/quality/duration/timing/severity/associated sxs/prior Treatment) Patient is a 58 y.o. male presenting with lower extremity injury. The history is provided by the patient and the spouse.  Ankle Injury This is a new problem. The current episode started 1 to 2 hours ago (stepped in a hole in backyard accidentally.). The problem has been gradually worsening. Associated symptoms comments: Abrasions to abd.. The symptoms are aggravated by walking.    Past Medical History  Diagnosis Date  . Pacemaker   . Hypercholesterolemia   . GERD (gastroesophageal reflux disease)   . Syncope   . Hypotension   . Barrett's esophagus    Past Surgical History  Procedure Laterality Date  . Pacemaker insertion  06/08/2010    Medtronic Revo model #RVDR01, serial [id number] H  . Cervical spine surgery      plates and screws  . Tonsillectomy    . Bladder surgery      stretched  . Birthmark removal      right thigh  . Wisdom tooth extraction    . Lower extremity arterial doppler  10/02/2006    No evidence of thrombus or thrombphlebitis.  . Cardiac catheterization  09/15/2006    Recommendation - empiric anti-reflux therapy  . Cardiovascular stress test  04/08/2011    No scintigraphic evidence of inducible myocardial ischemia. No lexiscan EKG changes. Non-diagnostic for ischemia.  . Transthoracic echocardiogram  04/06/2011    EF 55-60%, normal   Family History  Problem Relation Age of Onset  . Ovarian cancer Mother   . Heart failure Father   . Heart attack Father   . Heart attack Sister   . Lymphoma Brother     x 2  . COPD Sister   . Hypertension Brother   . Hyperlipidemia Brother   . Hyperlipidemia Sister   . Colon cancer Neg Hx   . Stomach cancer Neg Hx    History  Substance Use Topics  .  Smoking status: Former Smoker    Types: Cigarettes    Quit date: 11/27/1973  . Smokeless tobacco: Never Used  . Alcohol Use: No    Review of Systems  Constitutional: Negative.   Musculoskeletal: Positive for gait problem and joint swelling. Negative for back pain.  Skin: Negative.     Allergies  Other; Norvasc; Sulfonamide derivatives; and Levofloxacin  Home Medications   Prior to Admission medications   Medication Sig Start Date End Date Taking? Authorizing Provider  aspirin EC 81 MG tablet Take 81 mg by mouth daily.    Historical Provider, MD  chlorpheniramine-pseudoephedrine-acetaminophen (SINE-OFF) 2-30-500 MG per tablet Take 1 tablet by mouth every 4 (four) hours as needed. Sinus pain, pressure and congestion    Historical Provider, MD  cholecalciferol (VITAMIN D) 1000 UNITS tablet Take 1,000 Units by mouth daily.    Historical Provider, MD  fenofibrate (TRICOR) 145 MG tablet Take 1 tablet (145 mg total) by mouth daily. 05/22/13   Mihai Croitoru, MD  Multiple Vitamin (MULTIVITAMIN) tablet Take 1 tablet by mouth daily.    Historical Provider, MD  Naproxen Sodium (ALEVE) 220 MG CAPS Take 1 capsule by mouth as needed.    Historical Provider, MD  omeprazole (PRILOSEC) 20 MG capsule TAKE 1 CAPSULE (20 MG TOTAL) BY MOUTH DAILY. 11/26/12   Rachael Feeaniel P Jacobs, MD  OVER THE COUNTER MEDICATION Omega Red instead of Tricor    Historical Provider, MD   BP 151/93  Pulse 63  Temp(Src) 97.5 F (36.4 C) (Oral)  Resp 16  SpO2 100% Physical Exam  Nursing note and vitals reviewed. Constitutional: He is oriented to person, place, and time. He appears well-developed and well-nourished.  Musculoskeletal: He exhibits tenderness.       Right ankle: He exhibits decreased range of motion, swelling and deformity. He exhibits no ecchymosis and normal pulse. Tenderness. Lateral malleolus tenderness found. No medial malleolus, no head of 5th metatarsal and no proximal fibula tenderness found. Achilles  tendon normal.  Neurological: He is alert and oriented to person, place, and time.  Skin: Skin is warm and dry.    ED Course  Procedures (including critical care time) Labs Review Labs Reviewed - No data to display  Imaging Review Dg Ankle Complete Right  06/08/2013   CLINICAL DATA:  Injury.  EXAM: RIGHT ANKLE - COMPLETE 3+ VIEW  COMPARISON:  None.  FINDINGS: Soft tissue swelling over the lateral malleolus. No acute bony or joint abnormality identified.  IMPRESSION: Soft tissue swelling.  No evidence of fracture dislocation.   Electronically Signed   By: Maisie Fushomas  Register   On: 06/08/2013 19:57   X-rays reviewed and report per radiologist.   MDM   1. Moderate ankle sprain       Linna HoffJames D Caroll Cunnington, MD 06/08/13 2015

## 2013-06-08 NOTE — ED Notes (Signed)
Patient c/o right ankle injury onset today. Stepped in a whole and twisted his ankle. Also patient reports he has lacerations on his stomach. Patient is alert and oriented and in no acute distress.

## 2013-06-08 NOTE — Discharge Instructions (Signed)
Wear ankle support as needed for comfort, activity as tolerated. advil or pain medicine as needed,use ice and crutches for pain and swelling, return or see orthopedist if further problems.

## 2013-06-11 NOTE — Progress Notes (Signed)
Pacemaker remote check. Device function reviewed. Impedance, sensing, auto capture thresholds consistent with previous measurements. Histograms appropriate for patient and level of activity. All other diagnostic data reviewed and is appropriate and stable for patient. Real time/magnet EGM shows appropriate sensing and capture. No mode switch episodes.  69 fast AV episodes.  Plan to follow in 3 months remotely, to see in office annually.  Next remote 09/02/13.

## 2013-06-19 ENCOUNTER — Encounter: Payer: Self-pay | Admitting: Cardiovascular Disease

## 2013-08-23 ENCOUNTER — Emergency Department (HOSPITAL_COMMUNITY): Payer: Managed Care, Other (non HMO)

## 2013-08-23 ENCOUNTER — Emergency Department (HOSPITAL_COMMUNITY)
Admission: EM | Admit: 2013-08-23 | Discharge: 2013-08-23 | Disposition: A | Discharge status: Home / Self Care | Payer: Managed Care, Other (non HMO) | Attending: Emergency Medicine | Admitting: Emergency Medicine

## 2013-08-23 ENCOUNTER — Encounter (HOSPITAL_COMMUNITY): Payer: Self-pay | Admitting: Emergency Medicine

## 2013-08-23 DIAGNOSIS — M542 Cervicalgia: Secondary | ICD-10-CM

## 2013-08-23 DIAGNOSIS — Z87891 Personal history of nicotine dependence: Secondary | ICD-10-CM | POA: Insufficient documentation

## 2013-08-23 DIAGNOSIS — M5020 Other cervical disc displacement, unspecified cervical region: Secondary | ICD-10-CM

## 2013-08-23 DIAGNOSIS — K219 Gastro-esophageal reflux disease without esophagitis: Secondary | ICD-10-CM | POA: Insufficient documentation

## 2013-08-23 DIAGNOSIS — Z950 Presence of cardiac pacemaker: Secondary | ICD-10-CM | POA: Insufficient documentation

## 2013-08-23 DIAGNOSIS — IMO0002 Reserved for concepts with insufficient information to code with codable children: Secondary | ICD-10-CM | POA: Insufficient documentation

## 2013-08-23 DIAGNOSIS — Z79899 Other long term (current) drug therapy: Secondary | ICD-10-CM | POA: Insufficient documentation

## 2013-08-23 DIAGNOSIS — K2270 Barrett's esophagus without dysplasia: Secondary | ICD-10-CM | POA: Insufficient documentation

## 2013-08-23 DIAGNOSIS — Z7982 Long term (current) use of aspirin: Secondary | ICD-10-CM | POA: Insufficient documentation

## 2013-08-23 DIAGNOSIS — Z9889 Other specified postprocedural states: Secondary | ICD-10-CM | POA: Insufficient documentation

## 2013-08-23 DIAGNOSIS — R51 Headache: Secondary | ICD-10-CM | POA: Insufficient documentation

## 2013-08-23 DIAGNOSIS — E7800 Pure hypercholesterolemia, unspecified: Secondary | ICD-10-CM | POA: Insufficient documentation

## 2013-08-23 MED ORDER — PREDNISONE 10 MG PO TABS: ORAL_TABLET | ORAL | Status: DC

## 2013-08-23 MED ORDER — OXYCODONE-ACETAMINOPHEN 5-325 MG PO TABS: 1.0000 | ORAL_TABLET | ORAL | Status: DC | PRN

## 2013-08-23 MED ORDER — PREDNISONE 20 MG PO TABS
60.0000 mg | ORAL_TABLET | Freq: Once | ORAL | Status: AC
  Administered 2013-08-23: 60 mg via ORAL
  Filled 2013-08-23: qty 3

## 2013-08-23 MED ORDER — GADOBENATE DIMEGLUMINE 529 MG/ML IV SOLN
20.0000 mL | Freq: Once | INTRAVENOUS | Status: AC | PRN
  Administered 2013-08-23: 20 mL via INTRAVENOUS

## 2013-08-23 NOTE — ED Notes (Signed)
Pt reports having plates and screws in neck

## 2013-08-23 NOTE — ED Notes (Signed)
Pt has hx of neck surgery, usually has limited rotation to R side. Reports pain worse now when looking to L side. Denies injury; wife reports pts job requires lifting boxes and could be from that.

## 2013-08-23 NOTE — ED Notes (Signed)
PT ambulated with baseline gait; VSS; A&Ox3; no signs of distress; respirations even and unlabored; skin warm and dry; no questions upon discharge.  

## 2013-08-23 NOTE — ED Notes (Signed)
Phyllis, [employer]tronic tech, at bedside

## 2013-08-23 NOTE — Discharge Instructions (Signed)
Herniated Disk The bones of your spinal column (vertebrae) protect your spinal cord and nerves that go into your arms and legs. The vertebrae are separated by disks that cushion the spinal column and put space between your vertebrae. This allows movement between the vertebrae, which allows you to bend, rotate, and move your body from side to side. Sometimes, the disks move out of place (herniate) or break open (rupture) from injury or strain. The most common area for a disk herniation is in the lower back (lumbar area). Sometimes herniation occurs in the neck (cervical) disks.  CAUSES  As we grow older, the strong, fibrous cords that connect the vertebrae and support and surround the disks (ligaments) start to weaken. A strain on the back may cause a break in the disk ligaments. RISK FACTORS Herniated disks occur most often in men who are aged 18 years to 35 years, usually after strenuous activity. Other risk factors include conditions present at birth (congenital) that affect the size of the lumbar spinal canal. Additionally, a narrowing of the areas where the nerves exit the spinal canal can occur as you age. SYMPTOMS  Symptoms of a herniated disk vary. You may have weakness in certain muscles. This weakness can include difficulty lifting your leg or arm, difficulty standing on your toes on one side, or difficulty squeezing tightly with one of your hands. You may have numbness. You may feel a mild tingling, dull ache, or a burning or pulsating pain. In some cases, the pain is severe enough that you are unable to move. The pain most often occurs on one side of the body. The pain often starts slowly. It may get worse:  After you sit or stand.  At night.  When you sneeze, cough, or laugh.  When you bend backwards or walk more than a few yards. The pain, numbness, or weakness will often go away or improve a lot over a period of weeks to months. Herniated lumbar disk Symptoms of a herniated lumbar  disk may include sharp pain in one part of your leg, hip, or buttocks and numbness in other parts. You also may feel pain or numbness on the back of your calf or the top or sole of your foot. The same leg also may feel weak. Herniated cervical disk Symptoms of a herniated cervical disk may include pain when you move your neck, deep pain near or over your shoulder blade, or pain that moves to your upper arm, forearm, or fingers. DIAGNOSIS  To diagnose a herniated disk, your caregiver will perform a physical exam. Your caregiver also may perform diagnostic tests to see your disk or to test the reaction of your muscles and the function of your nerves. During the physical exam, your caregiver may ask you to:  Sit, stand, and walk. While you walk, your caregiver may ask you to try walking on your toes and then your heels.  Bend forward, backward, and sideways.  Raise your shoulders, elbow, wrist, and fingers and check your strength during these tasks. Your caregiver will check for:  Numbness or loss of feeling.  Muscle reflexes, which may be slower or missing.  Muscle strength, which may be weaker.  Posture or the way your spine curves. Diagnostic tests that may be done include:  A spinal X-ray exam to rule out other causes of back pain.  Magnetic resonance imaging (MRI) or computed tomography (CT) scan, which will show if the herniated disk is pressing on your spinal canal.  Electromyography.  This is sometimes used to identify the specific area of nerve involvement. TREATMENT  Initial treatment for a herniated disk is a short period of rest with medicines for pain. Pain medicines can include nonsteroidal anti-inflammatory medicines (NSAIDs), muscle relaxants for back spasms, and (rarely) narcotic pain medicine for severe pain that does not respond to NSAID use. Bed rest is often limited to 1 or 2 days at the most because prolonged rest can delay recovery. When the herniation involves the  lower back, sitting should be avoided as much as possible because sitting increases pressure on the ruptured disk. Sometimes a soft neck collar will be prescribed for a few days to weeks to help support your neck in the case of a cervical herniation. Physical therapy is often prescribed for patients with disk disease. Physical therapists will teach you how to properly lift, dress, walk, and perform other activities. They will work on strengthening the muscles that help support your spine. In some cases, physical therapy alone is not enough to treat a herniated disk. Steroid injections along the involved nerve root may be needed to help control pain. The steroid is injected in the area of the herniated disk and helps by reducing swelling around the disk. Sometimes surgery is the best option to treat a herniated disk.  SEEK IMMEDIATE MEDICAL CARE IF:   You have numbness, tingling, weakness, or problems with the use of your arms or legs.  You have severe headaches that are not relieved with the use of medicines.  You notice a change in your bowel or bladder control.  You have increasing pain in any areas of your body.  You experience shortness of breath, dizziness, or fainting. MAKE SURE YOU:   Understand these instructions.  Will watch your condition.  Will get help right away if you are not doing well or get worse. Document Released: 01/22/2000 Document Revised: 04/18/2011 Document Reviewed: 12/28/2012 [facility] Patient Information 2015 [place], [place]. This information is not intended to replace advice given to you by your health care provider. Make sure you discuss any questions you have with your health care provider.  Musculoskeletal Pain Musculoskeletal pain is muscle and boney aches and pains. These pains can occur in any part of the body. Your caregiver may treat you without knowing the cause of the pain. They may treat you if blood or urine tests, X-rays, and other tests were normal.    CAUSES There is often not a definite cause or reason for these pains. These pains may be caused by a type of germ (virus). The discomfort may also come from overuse. Overuse includes working out too hard when your body is not fit. Boney aches also come from weather changes. Bone is sensitive to atmospheric pressure changes. HOME CARE INSTRUCTIONS   Ask when your test results will be ready. Make sure you get your test results.  Only take over-the-counter or prescription medicines for pain, discomfort, or fever as directed by your caregiver. If you were given medications for your condition, do not drive, operate machinery or power tools, or sign legal documents for 24 hours. Do not drink alcohol. Do not take sleeping pills or other medications that may interfere with treatment.  Continue all activities unless the activities cause more pain. When the pain lessens, slowly resume normal activities. Gradually increase the intensity and duration of the activities or exercise.  During periods of severe pain, bed rest may be helpful. Lay or sit in any position that is comfortable.  Putting ice  on the injured area.  Put ice in a bag.  Place a towel between your skin and the bag.  Leave the ice on for 15 to 20 minutes, 3 to 4 times a day.  Follow up with your caregiver for continued problems and no reason can be found for the pain. If the pain becomes worse or does not go away, it may be necessary to repeat tests or do additional testing. Your caregiver may need to look further for a possible cause. SEEK IMMEDIATE MEDICAL CARE IF:  You have pain that is getting worse and is not relieved by medications.  You develop chest pain that is associated with shortness or breath, sweating, feeling sick to your stomach (nauseous), or throw up (vomit).  Your pain becomes localized to the abdomen.  You develop any new symptoms that seem different or that concern you. MAKE SURE YOU:   Understand these  instructions.  Will watch your condition.  Will get help right away if you are not doing well or get worse. Document Released: 01/24/2005 Document Revised: 04/18/2011 Document Reviewed: 09/28/2012 [facility] Patient Information 2015 [place]. This information is not intended to replace advice given to you by your health care provider. Make sure you discuss any questions you have with your health care provider.

## 2013-08-23 NOTE — ED Notes (Signed)
Pt in MRI.

## 2013-08-23 NOTE — ED Provider Notes (Signed)
CSN: [id number]     Arrival date & time 08/23/13  1414 History   First MD Initiated Contact with Patient 08/23/13 1549     Chief Complaint  Patient presents with  . Neck Pain  . Headache     (Consider location/radiation/quality/duration/timing/severity/associated sxs/prior Treatment) Patient is a 58 y.o. male presenting with neck pain and headaches. The history is provided by the patient.  Neck Pain Associated symptoms: headaches   Headache Associated symptoms: neck pain     He complains of right-sided neck pain, gradual in onset over the last 2 weeks. The pain causes him to have less of his neck and occasional headache. He denies fever, chills, nausea, weakness, dizziness, extremity paresthesias, or extremity weakness. 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 He is using his usual medicines, without relief. No other known modifying factors.  Past Medical History  Diagnosis Date  . Pacemaker   . Hypercholesterolemia   . GERD (gastroesophageal reflux disease)   . Syncope   . Hypotension   . Barrett's esophagus    Past Surgical History  Procedure Laterality Date  . Pacemaker insertion  06/08/2010    Medtronic Revo model #RVDR01, serial [id number]  . Cervical spine surgery      plates and screws  . Tonsillectomy    . Bladder surgery      stretched  . Birthmark removal      right thigh  . Wisdom tooth extraction    . Lower extremity arterial doppler  10/02/2006    No evidence of thrombus or thrombphlebitis.  . Cardiac catheterization  09/15/2006    Recommendation - empiric anti-reflux therapy  . Cardiovascular stress test  04/08/2011    No scintigraphic evidence of inducible myocardial ischemia. No lexiscan EKG changes. Non-diagnostic for ischemia.  . Transthoracic echocardiogram  04/06/2011    EF 55-60%, normal   Family History  Problem Relation Age of Onset  . Ovarian cancer Mother   . Heart failure  Father   . Heart attack Father   . Heart attack Sister   . Lymphoma Brother     x 2  . COPD Sister   . Hypertension Brother   . Hyperlipidemia Brother   . Hyperlipidemia Sister   . Colon cancer Neg Hx   . Stomach cancer Neg Hx    History  Substance Use Topics  . Smoking status: Former Smoker    Types: Cigarettes    Quit date: 11/27/1973  . Smokeless tobacco: Never Used  . Alcohol Use: No    Review of Systems  Musculoskeletal: Positive for neck pain.  Neurological: Positive for headaches.  All other systems reviewed and are negative.     Allergies  Other; Norvasc; Sulfonamide derivatives; and Levofloxacin  Home Medications   Prior to Admission medications   Medication Sig Start Date End Date Taking? Authorizing Provider  aspirin EC 81 MG tablet Take 81 mg by mouth daily.   Yes Historical Provider, MD  chlorpheniramine-pseudoephedrine-acetaminophen (SINE-OFF) 2-30-500 MG per tablet Take 2 tablets by mouth every 4 (four) hours as needed for allergies (or sinus pain).    Yes Historical Provider, MD  fenofibrate (TRICOR) 145 MG tablet Take 1 tablet (145 mg total) by mouth daily. 05/22/13  Yes Mihai Croitoru, MD  Multiple Vitamin (MULTIVITAMIN) tablet Take 1 tablet by mouth daily.   Yes Historical Provider, MD  Naphazoline HCl (CLEAR EYES OP) Place 2 drops into both eyes  daily as needed (for dry eyes).   Yes Historical Provider, MD  Naproxen Sodium (ALEVE) 220 MG CAPS Take 440 mg by mouth as needed (for pain or headache).    Yes Historical Provider, MD  omeprazole (PRILOSEC) 20 MG capsule Take 20 mg by mouth daily.   Yes Historical Provider, MD  oxyCODONE-acetaminophen (PERCOCET) 5-325 MG per tablet Take 1 tablet by mouth every 4 (four) hours as needed. 08/23/13   Flint Melter, MD  predniSONE (DELTASONE) 10 MG tablet Take q day 6,5,4,3,2,1 08/23/13   Flint Melter, MD   BP 145/98  Pulse 60  Temp(Src) 97.4 F (36.3 C) (Oral)  Resp 18  SpO2 99% Physical Exam  Nursing  note and vitals reviewed. Constitutional: He is oriented to person, place, and time. He appears well-developed and well-nourished.  HENT:  Head: Normocephalic and atraumatic.  Right Ear: External ear normal.  Left Ear: External ear normal.  Eyes: Conjunctivae and EOM are normal. Pupils are equal, round, and reactive to light.  Neck: Normal range of motion and phonation normal. Neck supple.  Cardiovascular: Normal rate, regular rhythm, normal heart sounds and intact distal pulses.   Pulmonary/Chest: Effort normal and breath sounds normal. He exhibits no bony tenderness.  Abdominal: Soft. There is no tenderness.  Musculoskeletal:  Mild right paravertebral cervical tenderness. Associated decreased right lateral bending and bilateral head rotation secondary to pain  Neurological: He is alert and oriented to person, place, and time. No cranial nerve deficit or sensory deficit. He exhibits normal muscle tone. Coordination normal.  Normal strength and sensation, arms, and legs, bilaterally.  Skin: Skin is warm, dry and intact.  Psychiatric: He has a normal mood and affect. His behavior is normal. Judgment and thought content normal.    ED Course  Procedures (including critical care time)  Patient declines analgesic medication. 16:20-Call placed to cardiology regarding his pacemaker, and its compatibility, for MRI imaging. Dr. Johney Frame, relayed that, an MR can be done if the pacemaker lead impedence is normal; since this indicates that there is no dislodged pacemaker lead. Pacemake interrogation ordered.   7:45 PM Reevaluation with update and discussion. After initial assessment and treatment, an updated evaluation reveals no change in clinical status. MRI, done, he tolerated it well. We'll contact neurosurgery to discuss results and arrange a followup, plan. Kegan Shepardson L   20:15- Discussed with Dr. Danielle Dess- he viewed the images with me. He recommends prednisone, and analgesia. He will f/u in  office.  Labs Review Labs Reviewed - No data to display  Imaging Review Mr Cervical Spine W Wo Contrast  08/23/2013   CLINICAL DATA:  Headache and neck pain  EXAM: MRI CERVICAL SPINE WITHOUT AND WITH CONTRAST  TECHNIQUE: Multiplanar and multiecho pulse sequences of the cervical spine, to include the craniocervical junction and cervicothoracic junction, were obtained according to standard protocol without and with intravenous contrast.  CONTRAST:  20mL MULTIHANCE GADOBENATE DIMEGLUMINE 529 MG/ML IV SOLN  COMPARISON:  CT 02/12/2010  FINDINGS: There is no abnormality at the foramen magnum, C1-2, C2-3 or C3-4.  C4-5: Minimal uncovertebral prominence. No significant stenosis of the canal or foramina.  C5 through C7: Previous ACDF. Wide patency of the canal and foramina.  C7-T1: Broad-based disc herniation with upward migration of disc material centrally and to the left behind C7. This indents the thecal sac but does not compress the cord or show foraminal extension.  T1-2: Normal.  No abnormal cord signal.  No abnormal enhancement.  IMPRESSION: Broad-based disc herniation at  C7-T1 with upward migration of disc material behind C7 centrally and to the left of midline. This indents the ventral subarachnoid space but does not compress the cord or show foraminal extension.  Mild uncovertebral degeneration at C4-5 without apparent stenosis or neural compression.   Electronically Signed   By: Paulina Fusi M.D.   On: 08/23/2013 19:15     EKG Interpretation None      MDM   Final diagnoses:  Neck pain on right side  Herniated disc, cervical    Neck pain with low cervical herniated disc. No clear evidence for causative factor, for his right-sided neck pain. There is no cervical myelopathy. Nursing Notes Reviewed/ Care Coordinated Applicable Imaging Reviewed Interpretation of Laboratory Data incorporated into ED treatment  The patient appears reasonably screened and/or stabilized for discharge and I doubt  any other medical condition or other [facility] requiring further screening, evaluation, or treatment in the ED at this time prior to discharge.  Plan: Home Medications- Prednisone, Percocet; Home Treatments- rest; return here if the recommended treatment, does not improve the symptoms; Recommended follow up- Neurosurgery 3-5 days  Flint Melter, MD 08/24/13 1719

## 2013-08-23 NOTE — ED Notes (Signed)
PT returned from MRI.

## 2013-08-23 NOTE — ED Notes (Signed)
MRI aware Medtronic tech at bedside

## 2013-08-23 NOTE — ED Notes (Signed)
Pt c/o right sided to mid neck pain x 3 days with pain in head; pt sts worse with movement

## 2013-09-02 ENCOUNTER — Encounter: Payer: Managed Care, Other (non HMO) | Admitting: *Deleted

## 2013-09-02 ENCOUNTER — Telehealth: Payer: Self-pay | Admitting: Cardiology

## 2013-09-02 NOTE — Telephone Encounter (Signed)
Attempted to call pt x1 to confirm remote transmission for pt. No answer

## 2013-09-03 ENCOUNTER — Encounter: Payer: Self-pay | Admitting: Cardiology

## 2013-09-11 ENCOUNTER — Telehealth: Payer: Self-pay | Admitting: Cardiovascular Disease

## 2013-09-12 NOTE — Telephone Encounter (Signed)
Closed encounter °

## 2013-11-29 ENCOUNTER — Telehealth: Payer: Self-pay | Admitting: Cardiovascular Disease

## 2013-11-29 DIAGNOSIS — E785 Hyperlipidemia, unspecified: Secondary | ICD-10-CM

## 2013-11-29 NOTE — Telephone Encounter (Signed)
Pt has an appt with Dr C on 12-09-13. Pt says he would like to have lab work this Monday. [facility] [place].Please call and let him know when you do this.

## 2013-11-29 NOTE — Telephone Encounter (Signed)
Returned call to patient he stated he would like to have fasting lab work done 12/02/13.Lab order entered.

## 2013-12-03 LAB — LIPID PANEL
CHOL/HDL RATIO: 5.5 ratio
CHOLESTEROL: 170 mg/dL (ref 0–200)
HDL: 31 mg/dL — AB (ref >39)
LDL Cholesterol: 114 mg/dL — ABNORMAL HIGH (ref 0–99)
Triglycerides: 126 mg/dL (ref <150)
VLDL: 25 mg/dL (ref 0–40)

## 2013-12-03 LAB — COMPREHENSIVE METABOLIC PANEL
ALK PHOS: 40 U/L (ref 39–117)
ALT: 27 U/L (ref 0–53)
AST: 26 U/L (ref 0–37)
Albumin: 4.7 g/dL (ref 3.5–5.2)
BILIRUBIN TOTAL: 0.5 mg/dL (ref 0.2–1.2)
BUN: 28 mg/dL — ABNORMAL HIGH (ref 6–23)
CO2: 25 mEq/L (ref 19–32)
CREATININE: 1.29 mg/dL (ref 0.50–1.35)
Calcium: 9.9 mg/dL (ref 8.4–10.5)
Chloride: 107 mEq/L (ref 96–112)
GLUCOSE: 94 mg/dL (ref 70–99)
Potassium: 4.5 mEq/L (ref 3.5–5.3)
Sodium: 139 mEq/L (ref 135–145)
Total Protein: 6.7 g/dL (ref 6.0–8.3)

## 2013-12-09 ENCOUNTER — Other Ambulatory Visit: Payer: Self-pay | Admitting: Gastroenterology

## 2013-12-10 ENCOUNTER — Encounter: Payer: Self-pay | Admitting: Cardiovascular Disease

## 2013-12-10 ENCOUNTER — Ambulatory Visit (INDEPENDENT_AMBULATORY_CARE_PROVIDER_SITE_OTHER): Payer: Managed Care, Other (non HMO) | Admitting: Cardiovascular Disease

## 2013-12-10 VITALS — BP 128/90 | HR 61 | Resp 16 | Ht 73.0 in | Wt 252.1 lb

## 2013-12-10 DIAGNOSIS — Z79899 Other long term (current) drug therapy: Secondary | ICD-10-CM

## 2013-12-10 DIAGNOSIS — Z9889 Other specified postprocedural states: Secondary | ICD-10-CM

## 2013-12-10 DIAGNOSIS — E785 Hyperlipidemia, unspecified: Secondary | ICD-10-CM

## 2013-12-10 DIAGNOSIS — Z8249 Family history of ischemic heart disease and other diseases of the circulatory system: Secondary | ICD-10-CM

## 2013-12-10 DIAGNOSIS — E782 Mixed hyperlipidemia: Secondary | ICD-10-CM

## 2013-12-10 DIAGNOSIS — R55 Syncope and collapse: Secondary | ICD-10-CM

## 2013-12-10 DIAGNOSIS — G4733 Obstructive sleep apnea (adult) (pediatric): Secondary | ICD-10-CM

## 2013-12-10 DIAGNOSIS — Z950 Presence of cardiac pacemaker: Secondary | ICD-10-CM

## 2013-12-10 DIAGNOSIS — R0602 Shortness of breath: Secondary | ICD-10-CM

## 2013-12-10 LAB — MDC_IDC_ENUM_SESS_TYPE_INCLINIC
Brady Statistic AP VP Percent: 0.1 % — CL
Brady Statistic AS VP Percent: 0 %
Brady Statistic AS VS Percent: 47.8 %
Lead Channel Impedance Value: 544 Ohm
Lead Channel Pacing Threshold Amplitude: 1 V
Lead Channel Pacing Threshold Amplitude: 1 V
Lead Channel Pacing Threshold Pulse Width: 0.4 ms
Lead Channel Pacing Threshold Pulse Width: 0.4 ms
Lead Channel Sensing Intrinsic Amplitude: 17.3 mV
Lead Channel Setting Pacing Amplitude: 2 V
Lead Channel Setting Pacing Pulse Width: 0.4 ms
Lead Channel Setting Sensing Sensitivity: 0.9 mV
MDC IDC MSMT BATTERY VOLTAGE: 3 V
MDC IDC MSMT LEADCHNL RA IMPEDANCE VALUE: 480 Ohm
MDC IDC MSMT LEADCHNL RA SENSING INTR AMPL: 2.2 mV
MDC IDC SET LEADCHNL RA PACING AMPLITUDE: 2 V
MDC IDC STAT BRADY AP VS PERCENT: 52.2 %
Zone Setting Detection Interval: 350 ms
Zone Setting Detection Interval: 430 ms

## 2013-12-10 NOTE — Patient Instructions (Addendum)
Remote monitoring is used to monitor your pacemaker from home. This monitoring reduces the number of office visits required to check your device to one time per year. It allows us to keep an eye on the functioning of your device to ensure it is working properly. You are scheduled for a device check from home on 03-13-2014. You may send your transmission at any time that day. If you have a wireless device, the transmission will be sent automatically. After your physician reviews your transmission, you will receive a postcard with your next transmission date.  Your physician recommends that you return for lab work in: One year prior to your next appointment FASTING at [facility].   Your physician recommends that you schedule a follow-up appointment in: 12 months with Dr.Croitoru

## 2013-12-10 NOTE — Progress Notes (Signed)
Patient ID: [name], male   DOB: 1955/03/22, 58 y.o.   MRN: [id number]     Reason for office visit Neurocardiogenic syncope, pacemaker follow-up, dyslipidemia.  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 He rarely has episodes of dizziness and these may be due to persistent vasopressor events. His pacemaker (Medtronic Revo MRI conditional, implanted 2012) is functioning normally by a comprehensive check today. It has not recorded any episodes of arrhythmia other than what is probably sinus tachycardia in the 140s. He also has dyslipidemia, predominantly low HDL on treatment with fenofibrate. On his most recent labs the LDL has increased. 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 He had the paresthesia and weak grip in both upper extremities which did not resolve after a fenofibrate "holiday". It now appears that those symptoms are related to his cervical spine problems. He has a strong family history of coronary disease, but had normal coronary arteries by cardiac catheterizations performed in 2008 and 2012. He has gastroesophageal reflux disease complicated by Barrett's esophagus.   Allergies  Allergen Reactions  . Other Anaphylaxis    GI Cocktail Chinese food- causes upset stomach and vomiting  . Norvasc [Amlodipine Besylate] Swelling  . Sulfonamide Derivatives     REACTION: tongue swells  . Levofloxacin Itching and Rash    Current Outpatient Prescriptions  Medication Sig Dispense Refill  . aspirin EC 81 MG tablet Take 81 mg by mouth daily.    . chlorpheniramine-pseudoephedrine-acetaminophen (SINE-OFF) 2-30-500 MG per tablet Take 2 tablets by mouth every 4 (four) hours as needed for allergies (or sinus pain).     .  fenofibrate (TRICOR) 145 MG tablet Take 1 tablet (145 mg total) by mouth daily. 90 tablet 2  . Multiple Vitamin (MULTIVITAMIN) tablet Take 1 tablet by mouth daily.    . Naphazoline HCl (CLEAR EYES OP) Place 2 drops into both eyes daily as needed (for dry eyes).    [name] omeprazole (PRILOSEC) 20 MG capsule Take 20 mg by mouth daily.     No current facility-administered medications for this visit.    Past Medical History  Diagnosis Date  . Pacemaker   . Hypercholesterolemia   . GERD (gastroesophageal reflux disease)   . Syncope   . Hypotension   . Barrett's esophagus     Past Surgical History  Procedure Laterality Date  . Pacemaker insertion  06/08/2010    Medtronic Revo model #RVDR01, serial [id number]  . Cervical spine surgery      plates and screws  . Tonsillectomy    . Bladder surgery      stretched  . Birthmark removal      right thigh  . Wisdom tooth extraction    . Lower extremity arterial doppler  10/02/2006    No evidence of thrombus or thrombphlebitis.  . Cardiac catheterization  09/15/2006    Recommendation - empiric anti-reflux therapy  . Cardiovascular stress test  04/08/2011    No scintigraphic evidence of inducible myocardial ischemia. No lexiscan EKG changes. Non-diagnostic for ischemia.  . Transthoracic echocardiogram  04/06/2011    EF 55-60%, normal    Family History  Problem Relation Age of Onset  . Ovarian cancer Mother   . Heart failure Father   . Heart attack Father   . Heart attack Sister   .  Lymphoma Brother     x 2  . COPD Sister   . Hypertension Brother   . Hyperlipidemia Brother   . Hyperlipidemia Sister   . Colon cancer Neg Hx   . Stomach cancer Neg Hx     History   Social History  . Marital Status: Married    Spouse Name: N/A    Number of Children: 2  . Years of Education: N/A   Occupational History  . 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 Smoking status: Former Smoker    Types: Cigarettes    Quit date:  11/27/1973  . Smokeless tobacco: Never Used  . Alcohol Use: No  . Drug Use: No  . Sexual Activity: Not on file   Other Topics Concern  . Not on file   Social History Narrative    Review of systems: The patient specifically denies any chest pain at rest or with exertion, dyspnea at rest or with exertion, orthopnea, paroxysmal nocturnal dyspnea, syncope, palpitations, focal neurological deficits, intermittent claudication, lower extremity edema, unexplained weight gain, cough, hemoptysis or wheezing.  The patient also denies abdominal pain, nausea, vomiting, dysphagia, diarrhea, constipation, polyuria, polydipsia, dysuria, hematuria, frequency, urgency, abnormal bleeding or bruising, fever, chills, unexpected weight changes, mood swings, change in skin or hair texture, change in voice quality, auditory or visual problems, allergic reactions or rashes, new musculoskeletal complaints other than usual "aches and pains".   PHYSICAL EXAM BP 128/90 mmHg  Pulse 61  Resp 16  Ht 6\' 1"  (1.854 m)  Wt 252 lb 1.6 oz (114.352 kg)  BMI 33.27 kg/m2  General: Alert, oriented x3, no distress Head: no evidence of trauma, PERRL, EOMI, no exophtalmos or lid lag, no myxedema, no xanthelasma; normal ears, nose and oropharynx Neck: normal jugular venous pulsations and no hepatojugular reflux; brisk carotid pulses without delay and no carotid bruits Chest: clear to auscultation, no signs of consolidation by percussion or palpation, normal fremitus, symmetrical and full respiratory excursions, healthy left pacemaker site Cardiovascular: normal position and quality of the apical impulse, regular rhythm, normal first and second heart sounds, no murmurs, rubs or gallops Abdomen: no tenderness or distention, no masses by palpation, no abnormal pulsatility or arterial bruits, normal bowel sounds, no hepatosplenomegaly Extremities: no clubbing, cyanosis or edema; 2+ radial, ulnar and brachial pulses bilaterally; 2+  right femoral, posterior tibial and dorsalis pedis pulses; 2+ left femoral, posterior tibial and dorsalis pedis pulses; no subclavian or femoral bruits Neurological: grossly nonfocal   EKG: normal sinus rhythm, nonspecific Q waves in leads 1 and aVL are likely due to a incomplete conduction abnormality and are unchanged from previous tracings  Lipid Panel     Component Value Date/Time   CHOL 170 12/02/2013 1052   TRIG 126 12/02/2013 1052   HDL 31* 12/02/2013 1052   CHOLHDL 5.5 12/02/2013 1052   VLDL 25 12/02/2013 1052   LDLCALC 114* 12/02/2013 1052    BMET    Component Value Date/Time   NA 139 12/02/2013 1050   K 4.5 12/02/2013 1050   CL 107 12/02/2013 1050   CO2 25 12/02/2013 1050   GLUCOSE 94 12/02/2013 1050   BUN 28* 12/02/2013 1050   CREATININE 1.29 12/02/2013 1050   CREATININE 1.26 01/13/2013 0007   CALCIUM 9.9 12/02/2013 1050   GFRNONAA 63* 01/13/2013 0007   GFRAA 73* 01/13/2013 0007     ASSESSMENT AND PLAN  Neurocardiogenic, predominantly cardioinhibitory, syncope: no event since pacemaker implantation. Normal pacemaker function  Dyslipidemia: His triglycerides and HDL are actually better than in the past but his LDL has increased. I have asked him to cut down on saturated fat and to increase physical activity. He needs to continue efforts at weight loss. I don't think he needs statin added to his fibrate yet.  Mild obesity: Discussed ways to improve diet to lose weight.  Orders Placed This Encounter  Procedures  . Comprehensive metabolic panel  . Lipid panel  . EKG 12-Lead   No orders of the defined types were placed in this encounter.    Junious SilkROITORU,Damontay Alred  Sandford Diop, MD, [facility] [phone] office [phone] pager

## 2014-01-08 ENCOUNTER — Encounter: Payer: Self-pay | Admitting: Cardiovascular Disease

## 2014-01-29 ENCOUNTER — Ambulatory Visit (INDEPENDENT_AMBULATORY_CARE_PROVIDER_SITE_OTHER): Payer: Managed Care, Other (non HMO) | Admitting: Nurse Practitioner

## 2014-01-29 ENCOUNTER — Encounter: Payer: Self-pay | Admitting: Nurse Practitioner

## 2014-01-29 VITALS — BP 136/90 | Temp 98.5°F | Ht 74.0 in | Wt 253.0 lb

## 2014-01-29 DIAGNOSIS — M79645 Pain in left finger(s): Secondary | ICD-10-CM

## 2014-01-30 LAB — URIC ACID: Uric Acid, Serum: 6 mg/dL (ref 4.0–7.8)

## 2014-02-02 ENCOUNTER — Encounter: Payer: Self-pay | Admitting: Nurse Practitioner

## 2014-02-02 NOTE — Progress Notes (Signed)
Subjective:  Presents for complaints of pain in the left thumb joint that began about a month ago. No specific history of injury. Had a "flareup" of swelling within this went away. No pain. No rash or psoriasis. No fever. No other joint involvement. Also thumbnail has a rigid wear new nail is coming through.  Objective:   BP 136/90 mmHg  Temp(Src) 98.5 F (36.9 C)  Ht 6\' 2"  (1.88 m)  Wt 253 lb (114.76 kg)  BMI 32.47 kg/m2 NAD. Alert, oriented. Nodularity and mild edema noted left thumb joint. No erythema warmth or tenderness. Normal ROM. Also has some nodularity at other fingers with some slight swan neck deformity and a finger on the right hand. There is a faint ridge in the middle of the left thumbnail but normal appearance of the nail.  Assessment:Thumb pain, left - Plan: Uric acid/most likely osteoarthritis  Plan: Uric acid level pending. If this is normal, recommend x-ray which patient defers today. Warning signs reviewed. Call back if worsens or persists.

## 2014-02-13 ENCOUNTER — Encounter (HOSPITAL_COMMUNITY): Payer: Self-pay | Admitting: Emergency Medicine

## 2014-02-13 ENCOUNTER — Emergency Department (HOSPITAL_COMMUNITY)
Admission: EM | Admit: 2014-02-13 | Discharge: 2014-02-13 | Disposition: A | Discharge status: Home / Self Care | Payer: Managed Care, Other (non HMO) | Attending: Emergency Medicine | Admitting: Emergency Medicine

## 2014-02-13 ENCOUNTER — Emergency Department (HOSPITAL_COMMUNITY): Payer: Managed Care, Other (non HMO)

## 2014-02-13 DIAGNOSIS — Z79899 Other long term (current) drug therapy: Secondary | ICD-10-CM | POA: Insufficient documentation

## 2014-02-13 DIAGNOSIS — D649 Anemia, unspecified: Secondary | ICD-10-CM | POA: Insufficient documentation

## 2014-02-13 DIAGNOSIS — Z7982 Long term (current) use of aspirin: Secondary | ICD-10-CM | POA: Insufficient documentation

## 2014-02-13 DIAGNOSIS — K219 Gastro-esophageal reflux disease without esophagitis: Secondary | ICD-10-CM | POA: Diagnosis not present

## 2014-02-13 DIAGNOSIS — Z8679 Personal history of other diseases of the circulatory system: Secondary | ICD-10-CM | POA: Insufficient documentation

## 2014-02-13 DIAGNOSIS — Z87891 Personal history of nicotine dependence: Secondary | ICD-10-CM | POA: Insufficient documentation

## 2014-02-13 DIAGNOSIS — R109 Unspecified abdominal pain: Secondary | ICD-10-CM | POA: Insufficient documentation

## 2014-02-13 DIAGNOSIS — K2270 Barrett's esophagus without dysplasia: Secondary | ICD-10-CM | POA: Diagnosis not present

## 2014-02-13 DIAGNOSIS — Z950 Presence of cardiac pacemaker: Secondary | ICD-10-CM | POA: Insufficient documentation

## 2014-02-13 DIAGNOSIS — N289 Disorder of kidney and ureter, unspecified: Secondary | ICD-10-CM | POA: Insufficient documentation

## 2014-02-13 DIAGNOSIS — E780 Pure hypercholesterolemia: Secondary | ICD-10-CM | POA: Insufficient documentation

## 2014-02-13 LAB — COMPREHENSIVE METABOLIC PANEL
ALBUMIN: 3.7 g/dL (ref 3.5–5.2)
ALT: 25 U/L (ref 0–53)
AST: 29 U/L (ref 0–37)
Alkaline Phosphatase: 48 U/L (ref 39–117)
Anion gap: 3 — ABNORMAL LOW (ref 5–15)
BILIRUBIN TOTAL: 0.7 mg/dL (ref 0.3–1.2)
BUN: 20 mg/dL (ref 6–23)
CO2: 28 mmol/L (ref 19–32)
Calcium: 9 mg/dL (ref 8.4–10.5)
Chloride: 106 mEq/L (ref 96–112)
Creatinine, Ser: 1.53 mg/dL — ABNORMAL HIGH (ref 0.50–1.35)
GFR calc non Af Amer: 49 mL/min — ABNORMAL LOW (ref >90)
GFR, EST AFRICAN AMERICAN: 57 mL/min — AB (ref >90)
GLUCOSE: 107 mg/dL — AB (ref 70–99)
POTASSIUM: 3.6 mmol/L (ref 3.5–5.1)
SODIUM: 137 mmol/L (ref 135–145)
Total Protein: 5.8 g/dL — ABNORMAL LOW (ref 6.0–8.3)

## 2014-02-13 LAB — URINALYSIS, ROUTINE W REFLEX MICROSCOPIC
BILIRUBIN URINE: NEGATIVE
GLUCOSE, UA: NEGATIVE mg/dL
Hgb urine dipstick: NEGATIVE
Ketones, ur: NEGATIVE mg/dL
LEUKOCYTES UA: NEGATIVE
NITRITE: NEGATIVE
PROTEIN: NEGATIVE mg/dL
SPECIFIC GRAVITY, URINE: 1.007 (ref 1.005–1.030)
Urobilinogen, UA: 1 mg/dL (ref 0.0–1.0)
pH: 6.5 (ref 5.0–8.0)

## 2014-02-13 LAB — LIPASE, BLOOD: Lipase: 33 U/L (ref 11–59)

## 2014-02-13 LAB — CBC WITH DIFFERENTIAL/PLATELET
Basophils Absolute: 0 10*3/uL (ref 0.0–0.1)
Basophils Relative: 0 % (ref 0–1)
EOS PCT: 0 % (ref 0–5)
Eosinophils Absolute: 0 10*3/uL (ref 0.0–0.7)
HCT: 35.7 % — ABNORMAL LOW (ref 39.0–52.0)
Hemoglobin: 12.4 g/dL — ABNORMAL LOW (ref 13.0–17.0)
LYMPHS ABS: 1.5 10*3/uL (ref 0.7–4.0)
Lymphocytes Relative: 25 % (ref 12–46)
MCH: 31.5 pg (ref 26.0–34.0)
MCHC: 34.7 g/dL (ref 30.0–36.0)
MCV: 90.6 fL (ref 78.0–100.0)
MONO ABS: 0.5 10*3/uL (ref 0.1–1.0)
Monocytes Relative: 9 % (ref 3–12)
NEUTROS ABS: 3.8 10*3/uL (ref 1.7–7.7)
Neutrophils Relative %: 66 % (ref 43–77)
Platelets: 145 10*3/uL — ABNORMAL LOW (ref 150–400)
RBC: 3.94 MIL/uL — AB (ref 4.22–5.81)
RDW: 12.5 % (ref 11.5–15.5)
WBC: 5.8 10*3/uL (ref 4.0–10.5)

## 2014-02-13 LAB — POC OCCULT BLOOD, ED: FECAL OCCULT BLD: NEGATIVE

## 2014-02-13 MED ORDER — IOHEXOL 300 MG/ML  SOLN
25.0000 mL | Freq: Once | INTRAMUSCULAR | Status: AC | PRN
  Administered 2014-02-13: 25 mL via ORAL

## 2014-02-13 MED ORDER — IOHEXOL 300 MG/ML  SOLN
100.0000 mL | Freq: Once | INTRAMUSCULAR | Status: AC | PRN
  Administered 2014-02-13: 100 mL via INTRAVENOUS

## 2014-02-13 MED ORDER — SODIUM CHLORIDE 0.9 % IV SOLN
Freq: Once | INTRAVENOUS | Status: AC
  Administered 2014-02-13: 04:00:00 via INTRAVENOUS

## 2014-02-13 NOTE — ED Notes (Signed)
Dr. Glick at the bedside.  

## 2014-02-13 NOTE — ED Provider Notes (Signed)
CSN: [id number]     Arrival date & time 02/13/14  4098 History   First MD Initiated Contact with Patient 02/13/14 [phone]     Chief complaint: Abdominal pain  (Consider location/radiation/quality/duration/timing/severity/associated sxs/prior Treatment) The history is provided by the patient.   59 year old male comes in with a 5 day history of crampy pain which starts in the left midabdomen with some radiation to the left upper quadrant and epigastric area. Pain will be sharp when it is severe and severe episodes will last 30-45 minutes before subsiding. He rates current pain at 2/10 but it gets a severe as 10/10. Nothing seems to make it better nothing makes it worse. He cannot identify which bring on the severe episodes. There is associated nausea but no vomiting. He has had chills but no fever or sweats. He has been having more frequent than normal bowel movements but not diarrhea. He has taken some acetaminophen for pain with no relief. He has had abdominal pain in the past but this is different from other episodes he has had.  Past Medical History  Diagnosis Date  . Pacemaker   . Hypercholesterolemia   . GERD (gastroesophageal reflux disease)   . Syncope   . Hypotension   . Barrett's esophagus    Past Surgical History  Procedure Laterality Date  . Pacemaker insertion  06/08/2010    Medtronic Revo model #RVDR01, serial [id number] H  . Cervical spine surgery      plates and screws  . Tonsillectomy    . Bladder surgery      stretched  . Birthmark removal      right thigh  . Wisdom tooth extraction    . Lower extremity arterial doppler  10/02/2006    No evidence of thrombus or thrombphlebitis.  . Cardiac catheterization  09/15/2006    Recommendation - empiric anti-reflux therapy  . Cardiovascular stress test  04/08/2011    No scintigraphic evidence of inducible myocardial ischemia. No lexiscan EKG changes. Non-diagnostic for ischemia.  . Transthoracic echocardiogram  04/06/2011    EF 55-60%,  normal   Family History  Problem Relation Age of Onset  . Ovarian cancer Mother   . Heart failure Father   . Heart attack Father   . Heart attack Sister   . Lymphoma Brother     x 2  . COPD Sister   . Hypertension Brother   . Hyperlipidemia Brother   . Hyperlipidemia Sister   . Colon cancer Neg Hx   . Stomach cancer Neg Hx    History  Substance Use Topics  . Smoking status: Former Smoker    Types: Cigarettes    Quit date: 11/27/1973  . Smokeless tobacco: Never Used  . Alcohol Use: No    Review of Systems  All other systems reviewed and are negative.     Allergies  Other; Norvasc; Sulfonamide derivatives; and Levofloxacin  Home Medications   Prior to Admission medications   Medication Sig Start Date End Date Taking? Authorizing Provider  aspirin EC 81 MG tablet Take 81 mg by mouth daily.    Historical Provider, MD  chlorpheniramine-pseudoephedrine-acetaminophen (SINE-OFF) 2-30-500 MG per tablet Take 2 tablets by mouth every 4 (four) hours as needed for allergies (or sinus pain).     Historical Provider, MD  fenofibrate (TRICOR) 145 MG tablet Take 1 tablet (145 mg total) by mouth daily. 05/22/13   Mihai Croitoru, MD  Multiple Vitamin (MULTIVITAMIN) tablet Take 1 tablet by mouth daily.    Historical Provider, MD  Naphazoline HCl (CLEAR EYES OP) Place 2 drops into both eyes daily as needed (for dry eyes).    Historical Provider, MD  omeprazole (PRILOSEC) 20 MG capsule Take 20 mg by mouth daily.    Historical Provider, MD   BP 137/94 mmHg  Pulse 82  Temp(Src) 97.9 F (36.6 C) (Oral)  Resp 21  Ht 6\' 1"  (1.854 m)  Wt 250 lb (113.399 kg)  BMI 32.99 kg/m2  SpO2 99% Physical Exam  Nursing note and vitals reviewed.  59 year old male, resting comfortably and in no acute distress. Vital signs are significant for borderline hypertension and mild tachypnea. Oxygen saturation is 99%, which is normal. Head is normocephalic and atraumatic. PERRLA, EOMI. Oropharynx is  clear. Neck is nontender and supple without adenopathy or JVD. Back is nontender and there is no CVA tenderness. Lungs are clear without rales, wheezes, or rhonchi. Chest is nontender. Heart has regular rate and rhythm without murmur. Abdomen is soft, flat, nontender without masses or hepatosplenomegaly and peristalsis is normoactive. Extremities have no cyanosis or edema, full range of motion is present. Skin is warm and dry without rash. Neurologic: Mental status is normal, cranial nerves are intact, there are no motor or sensory deficits.  ED Course  Procedures (including critical care time) Labs Review Results for orders placed or performed during the hospital encounter of 02/13/14  Comprehensive metabolic panel  Result Value Ref Range   Sodium 137 135 - 145 mmol/L   Potassium 3.6 3.5 - 5.1 mmol/L   Chloride 106 96 - 112 mEq/L   CO2 28 19 - 32 mmol/L   Glucose, Bld 107 (H) 70 - 99 mg/dL   BUN 20 6 - 23 mg/dL   Creatinine, Ser 1.611.53 (H) 0.50 - 1.35 mg/dL   Calcium 9.0 8.4 - 09.610.5 mg/dL   Total Protein 5.8 (L) 6.0 - 8.3 g/dL   Albumin 3.7 3.5 - 5.2 g/dL   AST 29 0 - 37 U/L   ALT 25 0 - 53 U/L   Alkaline Phosphatase 48 39 - 117 U/L   Total Bilirubin 0.7 0.3 - 1.2 mg/dL   GFR calc non Af Amer 49 (L) >90 mL/min   GFR calc Af Amer 57 (L) >90 mL/min   Anion gap 3 (L) 5 - 15  Lipase, blood  Result Value Ref Range   Lipase 33 11 - 59 U/L  CBC with Differential  Result Value Ref Range   WBC 5.8 4.0 - 10.5 K/uL   RBC 3.94 (L) 4.22 - 5.81 MIL/uL   Hemoglobin 12.4 (L) 13.0 - 17.0 g/dL   HCT 04.535.7 (L) 40.939.0 - 81.152.0 %   MCV 90.6 78.0 - 100.0 fL   MCH 31.5 26.0 - 34.0 pg   MCHC 34.7 30.0 - 36.0 g/dL   RDW 91.412.5 78.211.5 - 95.615.5 %   Platelets 145 (L) 150 - 400 K/uL   Neutrophils Relative % 66 43 - 77 %   Neutro Abs 3.8 1.7 - 7.7 K/uL   Lymphocytes Relative 25 12 - 46 %   Lymphs Abs 1.5 0.7 - 4.0 K/uL   Monocytes Relative 9 3 - 12 %   Monocytes Absolute 0.5 0.1 - 1.0 K/uL   Eosinophils  Relative 0 0 - 5 %   Eosinophils Absolute 0.0 0.0 - 0.7 K/uL   Basophils Relative 0 0 - 1 %   Basophils Absolute 0.0 0.0 - 0.1 K/uL  Urinalysis, Routine w reflex microscopic  Result Value Ref Range   Color,  Urine YELLOW YELLOW   APPearance CLEAR CLEAR   Specific Gravity, Urine 1.007 1.005 - 1.030   pH 6.5 5.0 - 8.0   Glucose, UA NEGATIVE NEGATIVE mg/dL   Hgb urine dipstick NEGATIVE NEGATIVE   Bilirubin Urine NEGATIVE NEGATIVE   Ketones, ur NEGATIVE NEGATIVE mg/dL   Protein, ur NEGATIVE NEGATIVE mg/dL   Urobilinogen, UA 1.0 0.0 - 1.0 mg/dL   Nitrite NEGATIVE NEGATIVE   Leukocytes, UA NEGATIVE NEGATIVE  POC occult blood, ED Provider will collect  Result Value Ref Range   Fecal Occult Bld NEGATIVE NEGATIVE   Imaging Review Ct Abdomen Pelvis W Contrast  02/13/2014   CLINICAL DATA:  Left mid abdominal pain near the umbilicus for 5 days. Pain gradually worsening. Nausea. No vomiting.  EXAM: CT ABDOMEN AND PELVIS WITH CONTRAST  TECHNIQUE: Multidetector CT imaging of the abdomen and pelvis was performed using the standard protocol following bolus administration of intravenous contrast.  CONTRAST:  25mL OMNIPAQUE IOHEXOL 300 MG/ML SOLN, OMNIPAQUE IOHEXOL 300 MG/ML SOLN  COMPARISON:  On 11/21/2011  FINDINGS: The lung bases are clear.  Mild diffuse fatty infiltration in the liver. No focal liver lesions. The gallbladder, spleen, pancreas, adrenal glands, kidneys, abdominal aorta, inferior vena cava, and retroperitoneal lymph nodes are unremarkable. Stomach, small bowel, and colon appear grossly normal. Under distention may limit evaluation. Stool fills the colon. No free air or free fluid in the abdomen. Scattered mesenteric lymph nodes are not pathologically enlarged and are likely reactive.  Pelvis: The appendix is normal. Bladder wall is not thickened. Prostate gland is not enlarged. No free or loculated pelvic fluid collections. No pelvic mass or lymphadenopathy. Stool-filled rectosigmoid  colon. No evidence of diverticulitis. The normal alignment of the lumbar spine. No destructive bone lesions.  IMPRESSION: No acute process demonstrated in the abdomen or pelvis. No evidence of bowel obstruction. Mild diffuse fatty infiltration of the liver.   Electronically Signed   By: Burman Nieves M.D.   On: 02/13/2014 05:40   Images viewed by me.   MDM   Final diagnoses:  Abdominal pain, unspecified abdominal location  Renal insufficiency  Normochromic normocytic anemia    Abdominal pain of uncertain cause. Old records are reviewed and he has been evaluated for abdominal pain in the past with negative CT scans and endoscopy only showing Barrett's esophagus. Screening labs are obtained and he will be sent for CT scan.  Workup does not give a good reason for abdominal pain. He has been resting comfortably during his time in the ED. Hemoglobin is noted so dropped 2 g since last checked 13 months ago. Rectal exam was done showing light brown stool which was Hemoccult negative. Cause for drop of hemoglobin is not clear. He is referred back to his PCP.  Dione Booze, MD 02/13/14 [phone]

## 2014-02-13 NOTE — ED Notes (Signed)
Patient finished drinking contrast. Notified Dana in radiology.

## 2014-02-13 NOTE — ED Notes (Signed)
Dr. Glick is at the bedside. 

## 2014-02-13 NOTE — ED Notes (Signed)
Patient started drinking contrast.

## 2014-02-13 NOTE — ED Notes (Signed)
Patient reports abdominal pain, denies vomiting or diarrhea. Pain in bilateral lower quadrants.  Also complains of "pain behind my ears".  Rates pain 2/10.  No abdominal surgeries. Hx of pacemaker placement in May 2012 and hyposyncope.

## 2014-02-13 NOTE — Discharge Instructions (Signed)
Your hemoglobin has dropped from 1 year ago. Please follow up with your PCP to evaluate why this occurred.   Abdominal Pain Many things can cause abdominal pain. Usually, abdominal pain is not caused by a disease and will improve without treatment. It can often be observed and treated at home. Your health care provider will do a physical exam and possibly order blood tests and X-rays to help determine the seriousness of your pain. However, in many cases, more time must pass before a clear cause of the pain can be found. Before that point, your health care provider may not know if you need more testing or further treatment. HOME CARE INSTRUCTIONS  Monitor your abdominal pain for any changes. The following actions may help to alleviate any discomfort you are experiencing:  Only take over-the-counter or prescription medicines as directed by your health care provider.  Do not take laxatives unless directed to do so by your health care provider.  Try a clear liquid diet (broth, tea, or water) as directed by your health care provider. Slowly move to a bland diet as tolerated. SEEK MEDICAL CARE IF:  You have unexplained abdominal pain.  You have abdominal pain associated with nausea or diarrhea.  You have pain when you urinate or have a bowel movement.  You experience abdominal pain that wakes you in the night.  You have abdominal pain that is worsened or improved by eating food.  You have abdominal pain that is worsened with eating fatty foods.  You have a fever. SEEK IMMEDIATE MEDICAL CARE IF:   Your pain does not go away within 2 hours.  You keep throwing up (vomiting).  Your pain is felt only in portions of the abdomen, such as the right side or the left lower portion of the abdomen.  You pass bloody or black tarry stools. MAKE SURE YOU:  Understand these instructions.   Will watch your condition.   Will get help right away if you are not doing well or get worse.  Document  Released: 11/03/2004 Document Revised: 01/29/2013 Document Reviewed: 10/03/2012 [facility] Patient Information 2015 [place] [place]. This information is not intended to replace advice given to you by your health care provider. Make sure you discuss any questions you have with your health care provider.

## 2014-03-07 ENCOUNTER — Other Ambulatory Visit: Payer: Self-pay | Admitting: Cardiovascular Disease

## 2014-03-07 NOTE — Telephone Encounter (Signed)
Rx(s) sent to pharmacy electronically.  

## 2014-03-10 ENCOUNTER — Other Ambulatory Visit: Payer: Self-pay | Admitting: Cardiovascular Disease

## 2014-03-10 NOTE — Telephone Encounter (Signed)
Rx refill denied to patient pharmacy  Rx was filled 03/07/14 for 30 and 11

## 2014-03-13 ENCOUNTER — Encounter: Payer: Managed Care, Other (non HMO) | Admitting: *Deleted

## 2014-03-13 ENCOUNTER — Telehealth: Payer: Self-pay | Admitting: Cardiology

## 2014-03-13 NOTE — Telephone Encounter (Signed)
Spoke with pt and reminded pt of remote transmission that is due today. Pt verbalized understanding.   

## 2014-03-14 ENCOUNTER — Encounter: Payer: Self-pay | Admitting: Cardiology

## 2014-04-21 ENCOUNTER — Encounter: Payer: Self-pay | Admitting: Family Medicine

## 2014-04-21 ENCOUNTER — Ambulatory Visit (INDEPENDENT_AMBULATORY_CARE_PROVIDER_SITE_OTHER): Payer: Managed Care, Other (non HMO) | Admitting: Family Medicine

## 2014-04-21 VITALS — BP 128/86 | Temp 98.5°F | Ht 74.0 in | Wt 260.0 lb

## 2014-04-21 DIAGNOSIS — J329 Chronic sinusitis, unspecified: Secondary | ICD-10-CM | POA: Diagnosis not present

## 2014-04-21 MED ORDER — AMOXICILLIN-POT CLAVULANATE 875-125 MG PO TABS: 1.0000 | ORAL_TABLET | Freq: Two times a day (BID) | ORAL | Status: AC

## 2014-04-21 NOTE — Progress Notes (Signed)
   Subjective:    Patient ID: [name], male    DOB: 05-16-1955, 59 y.o.   MRN: [id number]  Cough This is a new problem. The current episode started 1 to 4 weeks ago. The problem has been unchanged. The problem occurs every few hours. The cough is productive of sputum. Associated symptoms include wheezing. Associated symptoms comments: Runny nose. Nothing aggravates the symptoms. Treatments tried: OTC sinus medication. The treatment provided no relief.   Patient states that he has no other concerns at this time.   sarted a couple weeks ago  Started with sinus cong and felt a little roughj  Coughing up gunky stuff  Does not slmoke Dim energy Review of Systems  Respiratory: Positive for cough and wheezing.    no vomiting no diarrhea no rash     Objective:   Physical Exam  Alert mild malaise. Vitals stable. HEENT frontal tenderness and congestion pharynx erythematous. Neck supple. Lungs clear heart regular in rhythm.      Assessment & Plan:  Impression rhinosinusitis/bronchitis plan Augmentin twice a day 10 days. Symptomatic care discussed. Warning signs discussed. Preventive exam encourage WSL

## 2014-10-10 ENCOUNTER — Telehealth: Payer: Self-pay | Admitting: *Deleted

## 2014-10-10 NOTE — Telephone Encounter (Signed)
Diagnosis and medication recommendations to [facility] prior to dental procedures:  Not pacemaker dependent, has neurally mediated vasovagal syncope.  SBE prophylaxis not recommended.

## 2014-12-09 ENCOUNTER — Other Ambulatory Visit: Payer: Self-pay | Admitting: Gastroenterology

## 2014-12-18 ENCOUNTER — Telehealth: Payer: Self-pay | Admitting: Cardiovascular Disease

## 2014-12-18 DIAGNOSIS — E785 Hyperlipidemia, unspecified: Secondary | ICD-10-CM

## 2014-12-18 DIAGNOSIS — N419 Inflammatory disease of prostate, unspecified: Secondary | ICD-10-CM

## 2014-12-18 NOTE — Telephone Encounter (Signed)
PSAOK[facility] M[facility]

## 2014-12-18 NOTE — Telephone Encounter (Signed)
Ms.Derise is calling to have lab orders sent to the [facility] in the [place] . Please call if you have any questions   Thanks

## 2014-12-18 NOTE — Telephone Encounter (Addendum)
Pt had orders in system for CMP and Lipids.  Pt wanted [facility] [place]. Pt orders updated to [facility] and released.  Called and verified they are in their system.   Pt notified orders have been updated and locations in [place].   Dr. Salena Saner. Pt would like a PSA level drawn also as he has not seen his PCP and wants to know if you will check that for him?

## 2014-12-19 NOTE — Telephone Encounter (Signed)
Orders placed for PSA

## 2014-12-21 LAB — COMPREHENSIVE METABOLIC PANEL
A/G RATIO: 2 (ref 1.1–2.5)
ALBUMIN: 4.4 g/dL (ref 3.5–5.5)
ALT: 29 IU/L (ref 0–44)
AST: 34 IU/L (ref 0–40)
Alkaline Phosphatase: 47 IU/L (ref 39–117)
BILIRUBIN TOTAL: 0.6 mg/dL (ref 0.0–1.2)
BUN / CREAT RATIO: 16 (ref 9–20)
BUN: 22 mg/dL (ref 6–24)
CHLORIDE: 104 mmol/L (ref 97–106)
CO2: 22 mmol/L (ref 18–29)
Calcium: 9.6 mg/dL (ref 8.7–10.2)
Creatinine, Ser: 1.39 mg/dL — ABNORMAL HIGH (ref 0.76–1.27)
GFR, EST AFRICAN AMERICAN: 65 mL/min/{1.73_m2} (ref >59)
GFR, EST NON AFRICAN AMERICAN: 56 mL/min/{1.73_m2} — AB (ref >59)
GLOBULIN, TOTAL: 2.2 g/dL (ref 1.5–4.5)
Glucose: 97 mg/dL (ref 65–99)
POTASSIUM: 4.4 mmol/L (ref 3.5–5.2)
Sodium: 143 mmol/L (ref 136–144)
Total Protein: 6.6 g/dL (ref 6.0–8.5)

## 2014-12-21 LAB — LIPID PANEL
CHOLESTEROL TOTAL: 170 mg/dL (ref 100–199)
Chol/HDL Ratio: 5.7 ratio units — ABNORMAL HIGH (ref 0.0–5.0)
HDL: 30 mg/dL — AB (ref >39)
LDL Calculated: 117 mg/dL — ABNORMAL HIGH (ref 0–99)
Triglycerides: 115 mg/dL (ref 0–149)
VLDL Cholesterol Cal: 23 mg/dL (ref 5–40)

## 2014-12-22 LAB — PSA: PROSTATE SPECIFIC AG, SERUM: 0.9 ng/mL (ref 0.0–4.0)

## 2014-12-30 ENCOUNTER — Ambulatory Visit (INDEPENDENT_AMBULATORY_CARE_PROVIDER_SITE_OTHER): Payer: Managed Care, Other (non HMO) | Admitting: Cardiovascular Disease

## 2014-12-30 ENCOUNTER — Encounter: Payer: Self-pay | Admitting: Cardiovascular Disease

## 2014-12-30 VITALS — BP 141/86 | HR 67 | Resp 16 | Ht 74.0 in | Wt 257.2 lb

## 2014-12-30 DIAGNOSIS — E785 Hyperlipidemia, unspecified: Secondary | ICD-10-CM | POA: Diagnosis not present

## 2014-12-30 DIAGNOSIS — R55 Syncope and collapse: Secondary | ICD-10-CM

## 2014-12-30 DIAGNOSIS — Z950 Presence of cardiac pacemaker: Secondary | ICD-10-CM

## 2014-12-30 LAB — CUP PACEART INCLINIC DEVICE CHECK
Battery Voltage: 2.99 V
Brady Statistic AP VP Percent: 0 %
Brady Statistic AS VS Percent: 55.95 %
Brady Statistic RA Percent Paced: 44.05 %
Date Time Interrogation Session: 20161122120728
Implantable Lead Location: 753859
Implantable Lead Location: 753860
Lead Channel Sensing Intrinsic Amplitude: 1.565 mV
Lead Channel Sensing Intrinsic Amplitude: 14.558 mV
Lead Channel Setting Pacing Amplitude: 2 V
Lead Channel Setting Pacing Pulse Width: 0.4 ms
Lead Channel Setting Sensing Sensitivity: 0.9 mV
MDC IDC LEAD IMPLANT DT: 20120501
MDC IDC LEAD IMPLANT DT: 20120501
MDC IDC LEAD MODEL: 5086
MDC IDC LEAD MODEL: 5086
MDC IDC MSMT LEADCHNL RA IMPEDANCE VALUE: 456 Ohm
MDC IDC MSMT LEADCHNL RV IMPEDANCE VALUE: 488 Ohm
MDC IDC SET LEADCHNL RV PACING AMPLITUDE: 2.5 V
MDC IDC STAT BRADY AP VS PERCENT: 44.05 %
MDC IDC STAT BRADY AS VP PERCENT: 0 %
MDC IDC STAT BRADY RV PERCENT PACED: 0 %

## 2014-12-30 NOTE — Patient Instructions (Addendum)
Remote monitoring is used to monitor your pacemaker from home. This monitoring reduces the number of office visits required to check your device to one time per year. It allows us to keep an eye on the functioning of your device to ensure it is working properly. You are scheduled for a device check from home on 03/31/15. You may send your transmission at any time that day. If you have a wireless device, the transmission will be sent automatically. After your physician reviews your transmission, you will receive a postcard with your next transmission date.  Your physician recommends that you schedule a follow-up appointment in: 12 months with Dr.Croitoru

## 2014-12-30 NOTE — Progress Notes (Signed)
Patient ID: [name], male   DOB: [birth year], 57 y.o.   MRN: [id number]      Cardiology Office Note   Date:  12/30/2014   ID:  [name], DOB 1955-07-19, MRN [id number]  PCP:  Lilyan Punt, MD  Cardiologist:   Thurmon Fair, MD   Chief Complaint  Patient presents with  . Annual Exam    patient reports shortness of breath on exertion and at rest - "old and out of shape"      History of Present Illness: [name] is a 59 y.o. male who presents for  Follow-up for neurocardiogenic syncope and a pacemaker check. He has not had episodes of syncope or near syncope since his last appointment.   Systolic blood pressure is usually right around the 140 mmHg , as he tries to maintain a relatively high sodium diet. I also reminded him about the importance of good hydration. His renal function has improved compared to the labs performed in January but is still in the slightly abnormal range. He is a large, muscular gentleman and his GFR still calculates out at 56 mL/minute. He has not had problems with chest pain, palpitations, exertional dyspnea, leg edema, claudication or erectile dysfunction. He does complain about not being as strong as when he was younger and having less stamina. He had problems with cervical spine herniated disks earlier in the year which limited his ability to work , but this has improved. He still mildly obese with a BMI of 33   pacemaker interrogation shows normal device function. He has an MRI conditional Medtronic Revo dual-chamber pacemaker implant in 2012. Battery voltage is 2.99 V (recommended replacement at 2.81 V). Activity level is 4.3 hours/day over the last week similar over the whole year. He has 44% atrial pacing and never requires ventricular pacing. Heart rate histogram is in the normal range. He has had very rare episodes of advanced mode switch , most of which likely represent atrial tachycardia. Rare episodes of brief nonsustained paroxysmal atrial  tachycardia are seen. No ventricular arrhythmia is noted   His recent lipid profile showed and HDL of 30 and an LDL of 117, both of which would likely improve with weight loss. 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 Has a strong family history of coronary disease, but had normal coronary arteries by cardiac catheterizations performed in 2008 and 2012. He has gastroesophageal reflux disease complicated by Barrett's esophagus.   Past Medical History  Diagnosis Date  . Pacemaker   . Hypercholesterolemia   . GERD (gastroesophageal reflux disease)   . Syncope   . Hypotension   . Barrett's esophagus     Past Surgical History  Procedure Laterality Date  . Pacemaker insertion  06/08/2010    Medtronic Revo model #RVDR01, serial [id number]  . Cervical spine surgery      plates and screws  . Tonsillectomy    . Bladder surgery      stretched  . Birthmark removal      right thigh  . Wisdom tooth extraction    . Lower extremity arterial doppler  10/02/2006    No evidence of thrombus or thrombphlebitis.  . Cardiac catheterization  09/15/2006    Recommendation - empiric anti-reflux therapy  . Cardiovascular stress test  04/08/2011    No scintigraphic evidence of inducible myocardial ischemia. No lexiscan EKG changes. Non-diagnostic for ischemia.  . Transthoracic echocardiogram  04/06/2011    EF 55-60%, normal     Current Outpatient Prescriptions  Medication Sig Dispense Refill  . aspirin EC 81 MG tablet Take 81 mg by mouth daily.    . chlorpheniramine-pseudoephedrine-acetaminophen (SINE-OFF) 2-30-500 MG per tablet Take 2 tablets by mouth every 4 (four) hours as needed for allergies (or sinus pain).     . Cholecalciferol (VITAMIN D PO) Take 1 tablet by mouth daily.    . fenofibrate (TRICOR) 145 MG tablet Take 1 tablet (145 mg total) by mouth daily. 90 tablet  3  . Multiple Vitamin (MULTIVITAMIN) tablet Take 1 tablet by mouth daily.    . Naphazoline HCl (CLEAR EYES OP) Place 2 drops into both eyes daily as needed (for dry eyes).    [name]. omeprazole (PRILOSEC) 20 MG capsule TAKE ONE CAPSULE BY MOUTH EVERY DAY 90 capsule 3   No current facility-administered medications for this visit.    Allergies:   Other; Norvasc; Sulfonamide derivatives; and Levofloxacin    Social History:  The patient  reports that he quit smoking about 41 years ago. His smoking use included Cigarettes. He has never used smokeless tobacco. He reports that he does not drink alcohol or use illicit drugs.   Family History:  The patient's family history includes COPD in his sister; Heart attack in his father and sister; Heart failure in his father; Hyperlipidemia in his brother and sister; Hypertension in his brother; Lymphoma in his brother; Ovarian cancer in his mother. There is no history of Colon cancer or Stomach cancer.    ROS:  Please see the history of present illness.    Otherwise, review of systems positive for none.   All other systems are reviewed and negative.    PHYSICAL EXAM: VS:  BP 141/86 mmHg  Pulse 67  Resp 16  Ht 6\' 2"  (1.88 m)  Wt 257 lb 3.2 oz (116.665 kg)  BMI 33.01 kg/m2 , BMI Body mass index is 33.01 kg/(m^2).  General: Alert, oriented x3, no distress Head: no evidence of trauma, PERRL, EOMI, no exophtalmos or lid lag, no myxedema, no xanthelasma; normal ears, nose and oropharynx Neck: normal jugular venous pulsations and no hepatojugular reflux; brisk carotid pulses without delay and no carotid bruits Chest: clear to auscultation, no signs of consolidation by percussion or palpation, normal fremitus, symmetrical and full respiratory excursions,  Healthy left subclavian pacemaker site Cardiovascular: normal position and quality of the apical impulse, regular rhythm, normal first and second heart sounds, no murmurs, rubs or gallops Abdomen: no tenderness  or distention, no masses by palpation, no abnormal pulsatility or arterial bruits, normal bowel sounds, no hepatosplenomegaly Extremities: no clubbing, cyanosis or edema; 2+ radial, ulnar and brachial pulses bilaterally; 2+ right femoral, posterior tibial and dorsalis pedis pulses; 2+ left femoral, posterior tibial and dorsalis pedis pulses; no subclavian or femoral bruits Neurological: grossly nonfocal Psych: euthymic mood, full affect   EKG:  EKG is ordered today. The ekg ordered today demonstrates  Atrial paced, ventricular sensed rhythm, minor intraventricular conduction delay, QRS 112 ms, QTC 402 ms. Chronic Q waves are seen in leads 1 and aVL and are likely secondary to conduction abnormality   Recent Labs: 02/13/2014: Hemoglobin 12.4*; Platelets 145* 12/20/2014: ALT 29; BUN 22; Creatinine, Ser 1.39*; Potassium 4.4; Sodium 143    Lipid Panel    Component Value Date/Time   CHOL 170 12/20/2014 0938   CHOL 170 12/02/2013 1052   TRIG 115 12/20/2014 0938   HDL 30* 12/20/2014 0938   HDL  31* 12/02/2013 1052   CHOLHDL 5.7* 12/20/2014 0938   CHOLHDL 5.5 12/02/2013 1052   VLDL 25 12/02/2013 1052   LDLCALC 117* 12/20/2014 0938   LDLCALC 114* 12/02/2013 1052      Wt Readings from Last 3 Encounters:  12/30/14 257 lb 3.2 oz (116.665 kg)  04/21/14 260 lb (117.935 kg)  02/13/14 250 lb (113.399 kg)      ASSESSMENT AND PLAN:  1.  History of neurocardiogenic (predominantly cardioinhibitory) syncope, no recurrence since pacemaker implantation.  Encouraged him to focus not just on increased sodium intake but also increased intake of water.  2.  Dyslipidemia, primarily HDL cholesterol is low. Reviewed healthy diet. He has been eating between 3 and 5 pounds of bananas a week since he thought he needed the potassium. Have asked him to cut out high starch foods such as bananas , potatoes, bread , Rice.  He is pretty active physically and I think the focus is on reduced caloric intake.  3.   Obesity  4. Chronic kidney disease stage II. His creatinine is actually better now than it was over the summer, but he has had 3 consecutive abnormal creatinine levels and this may be more than just dehydration.  He had a normal urinalysis earlier this year  5. Normal pacemaker function. His device is MRI conditional. [facility] every 3 months and yearly office visit    Current medicines are reviewed at length with the patient today.  The patient does not have concerns regarding medicines.  The following changes have been made:  no change  Labs/ tests ordered today include:  No orders of the defined types were placed in this encounter.     Patient Instructions  Remote monitoring is used to monitor your pacemaker from home. This monitoring reduces the number of office visits required to check your device to one time per year. It allows [place] to keep an eye on the functioning of your device to ensure it is working properly. You are scheduled for a device check from home on 03/31/15. You may send your transmission at any time that day. If you have a wireless device, the transmission will be sent automatically. After your physician reviews your transmission, you will receive a postcard with your next transmission date.  Your physician recommends that you schedule a follow-up appointment in: 12 months with Dr.Anani Gu    Signed, Thurmon Fair, MD  12/30/2014 9:07 AM    Thurmon Fair, MD, [facility] [phone] office [phone] pager

## 2015-01-08 ENCOUNTER — Encounter: Payer: Self-pay | Admitting: Cardiovascular Disease

## 2015-01-23 ENCOUNTER — Encounter: Payer: Self-pay | Admitting: Family Medicine

## 2015-01-23 ENCOUNTER — Ambulatory Visit (INDEPENDENT_AMBULATORY_CARE_PROVIDER_SITE_OTHER): Payer: Managed Care, Other (non HMO) | Admitting: Family Medicine

## 2015-01-23 VITALS — BP 150/90 | Temp 98.1°F | Ht 74.0 in | Wt 255.5 lb

## 2015-01-23 DIAGNOSIS — J329 Chronic sinusitis, unspecified: Secondary | ICD-10-CM

## 2015-01-23 DIAGNOSIS — J310 Chronic rhinitis: Secondary | ICD-10-CM

## 2015-01-23 MED ORDER — AMOXICILLIN-POT CLAVULANATE 875-125 MG PO TABS: 1.0000 | ORAL_TABLET | Freq: Two times a day (BID) | ORAL | Status: AC

## 2015-01-23 NOTE — Progress Notes (Signed)
   Subjective:    Patient ID: [name], male    DOB: 1955-06-23, 59 y.o.   MRN: [id number]  Sinusitis This is a new problem. The current episode started in the past 7 days. The problem is unchanged. Maximum temperature: unmeasured. The pain is moderate. Associated symptoms include congestion, headaches and sinus pressure. Past treatments include acetaminophen and oral decongestants. The treatment provided no relief.   Patient has no other concerns at this time.   Stuffy pressure behind eyes  Tend s to nmove into the sinuses  Not into chest at this time     Review of Systems  HENT: Positive for congestion and sinus pressure.   Neurological: Positive for headaches.       Objective:   Physical Exam  Alert moderate malaise. HEENT moderate nasal congestion frontal tenderness pharynx normal neck supple lungs clear heart regular in rhythm      Assessment & Plan:  Impression 1 acute rhinosinusitis plan antibiotics prescribed. Symptom care discussed warning signs discussed WSL

## 2015-03-15 ENCOUNTER — Other Ambulatory Visit: Payer: Self-pay | Admitting: Cardiovascular Disease

## 2015-03-16 NOTE — Telephone Encounter (Signed)
Rx(s) sent to pharmacy electronically.  

## 2015-03-31 ENCOUNTER — Encounter: Payer: Managed Care, Other (non HMO) | Admitting: *Deleted

## 2015-04-01 ENCOUNTER — Encounter: Payer: Self-pay | Admitting: Cardiology

## 2015-09-20 ENCOUNTER — Encounter (HOSPITAL_COMMUNITY): Payer: Self-pay | Admitting: Emergency Medicine

## 2015-09-20 ENCOUNTER — Ambulatory Visit (HOSPITAL_COMMUNITY)
Admission: EM | Admit: 2015-09-20 | Discharge: 2015-09-20 | Disposition: A | Discharge status: Home / Self Care | Payer: Managed Care, Other (non HMO) | Attending: Family Medicine | Admitting: Family Medicine

## 2015-09-20 DIAGNOSIS — H109 Unspecified conjunctivitis: Secondary | ICD-10-CM | POA: Diagnosis not present

## 2015-09-20 DIAGNOSIS — H5789 Other specified disorders of eye and adnexa: Secondary | ICD-10-CM

## 2015-09-20 DIAGNOSIS — H578 Other specified disorders of eye and adnexa: Secondary | ICD-10-CM

## 2015-09-20 DIAGNOSIS — S0502XA Injury of conjunctiva and corneal abrasion without foreign body, left eye, initial encounter: Secondary | ICD-10-CM

## 2015-09-20 MED ORDER — ERYTHROMYCIN 5 MG/GM OP OINT: TOPICAL_OINTMENT | OPHTHALMIC | 0 refills | Status: DC

## 2015-09-20 NOTE — ED Triage Notes (Signed)
[facility] a complaint of left eye pain and irritation that started 1 day ago after mowing the yard.

## 2015-09-20 NOTE — Discharge Instructions (Signed)
The best treatment is rest and sleep for the first day. Use the antibiotic ointment as directed. If you are having problems, pain, worsening, visual disturbances or not improving in 24-36 hours call the ophthalmologist on page one to see promptly.

## 2015-09-20 NOTE — ED Provider Notes (Signed)
CSN: [id number]     Arrival date & time 09/20/15  1235 History   First MD Initiated Contact with Patient 09/20/15 1419     Chief Complaint  Patient presents with  . Eye Problem   (Consider location/radiation/quality/duration/timing/severity/associated sxs/prior Treatment) 60 year old male presents to the urgent care with complaints of irritation to her left eye since chest today morning. The day previously he had been working outside in the yard and mowing. Denies discomfort or symptoms at that time. Today he is complaining of an irritated watery eye with a foreign body sensation. He states his vision is a little blurry at times but that is primarily due to the watering of the eye according to the patient.      Past Medical History:  Diagnosis Date  . Barrett's esophagus   . GERD (gastroesophageal reflux disease)   . Hypercholesterolemia   . Hypotension   . Pacemaker   . Syncope    Past Surgical History:  Procedure Laterality Date  . birthmark removal     right thigh  . BLADDER SURGERY     stretched  . CARDIAC CATHETERIZATION  09/15/2006   Recommendation - empiric anti-reflux therapy  . CARDIOVASCULAR STRESS TEST  04/08/2011   No scintigraphic evidence of inducible myocardial ischemia. No lexiscan EKG changes. Non-diagnostic for ischemia.  . CERVICAL SPINE SURGERY     plates and screws  . LOWER EXTREMITY ARTERIAL DOPPLER  10/02/2006   No evidence of thrombus or thrombphlebitis.  [name] PACEMAKER INSERTION  06/08/2010   Medtronic Revo model #RVDR01, serial [id number]  . TONSILLECTOMY    . TRANSTHORACIC ECHOCARDIOGRAM  04/06/2011   EF 55-60%, normal  . WISDOM TOOTH EXTRACTION     Family History  Problem Relation Age of Onset  . Ovarian cancer Mother   . Heart failure Father   . Heart attack Father   . Heart attack Sister   . Lymphoma Brother     x 2  . COPD Sister   . Hypertension Brother   . Hyperlipidemia Brother   . Hyperlipidemia Sister   . Colon cancer Neg Hx   .  Stomach cancer Neg Hx    Social History  Substance Use Topics  . Smoking status: Former Smoker    Types: Cigarettes    Quit date: 11/27/1973  . Smokeless tobacco: Never Used  . Alcohol use No    Review of Systems  Constitutional: Negative.   HENT: Negative.   Eyes: Positive for redness and itching.       As per history of present illness.  Respiratory: Negative.   Skin: Negative.   Neurological: Negative.   Psychiatric/Behavioral: Negative.   All other systems reviewed and are negative.   Allergies  Other; Norvasc [amlodipine besylate]; Sulfonamide derivatives; and Levofloxacin  Home Medications   Prior to Admission medications   Medication Sig Start Date End Date Taking? Authorizing Provider  aspirin EC 81 MG tablet Take 81 mg by mouth daily.   Yes Historical Provider, MD  chlorpheniramine-pseudoephedrine-acetaminophen (SINE-OFF) 2-30-500 MG per tablet Take 2 tablets by mouth every 4 (four) hours as needed for allergies (or sinus pain).    Yes Historical Provider, MD  fenofibrate (TRICOR) 145 MG tablet TAKE 1 TABLET (145 MG TOTAL) BY MOUTH DAILY. 03/16/15  Yes Mihai Croitoru, MD  omeprazole (PRILOSEC) 20 MG capsule TAKE ONE CAPSULE BY MOUTH EVERY DAY 12/09/14  Yes Rachael Fee, MD  Cholecalciferol (VITAMIN D PO) Take 1 tablet by mouth daily.    Historical Provider, MD  erythromycin ophthalmic ointment Place a 1/2 inch ribbon of ointment into the left lower eyelid qid x 5 d 09/20/15   Hayden Rasmussenavid Prospero Mahnke, NP  Multiple Vitamin (MULTIVITAMIN) tablet Take 1 tablet by mouth daily.    Historical Provider, MD  Naphazoline HCl (CLEAR EYES OP) Place 2 drops into both eyes daily as needed (for dry eyes).    Historical Provider, MD   Meds Ordered and Administered this Visit  Medications - No data to display  BP 127/83 (BP Location: Left Arm)   Pulse 64   Temp 98 F (36.7 C) (Oral)   SpO2 100%  No data found.   Physical Exam  Constitutional: He is oriented to person, place, and time. He  appears well-developed and well-nourished. No distress.  Eyes: EOM are normal. Pupils are equal, round, and reactive to light.  Right eye exam grossly normal.  Left eye with upper and lower conjunctiva erythema and minor swelling. Positive for scleral injection. Anterior chamber is clear. Normal pupillary reaction. Positive for clear watery tearing and discharge. No purulence. Under magnification no foreign bodies are seen. The lids are everted and no foreign body seen.  Tetracaine was placed into the left eye. After sufficient anesthesia floor seen introduced. Using the black light and magnification the upper eyelid again was swept and visualized with no foreign body. There is a small dotted uptake of stain in the center of the cornea over the mid pupil. This is superficial. There is no dark spot or other indicator of foreign body. There is the usual uptake to the areas of erythema and inflammation but no other defect is noted. Eye was then irrigated with eyewash.  Neck: Normal range of motion. Neck supple.  Cardiovascular: Normal rate.   Pulmonary/Chest: Effort normal. No respiratory distress.  Musculoskeletal: He exhibits no edema.  Neurological: He is alert and oriented to person, place, and time. He exhibits normal muscle tone.  Skin: Skin is warm and dry.  Psychiatric: He has a normal mood and affect.  Nursing note and vitals reviewed.   Urgent Care Course   Clinical Course    Procedures (including critical care time)  Labs Review Labs Reviewed - No data to display  Imaging Review No results found.   Visual Acuity Review  Right Eye Distance: 20/15 Left Eye Distance: 20/40 Bilateral Distance: 20/20  Right Eye Near:   Left Eye Near:    Bilateral Near:         MDM   1. Corneal abrasion, left, initial encounter   2. Conjunctivitis of left eye   3. Eye irritation    The best treatment is rest and sleep for the first day. Use the antibiotic ointment as directed. If  you are having problems, pain, worsening, visual disturbances or not improving in 24-36 hours call the ophthalmologist on page one to see promptly. Meds ordered this encounter  Medications  . erythromycin ophthalmic ointment    Sig: Place a 1/2 inch ribbon of ointment into the left lower eyelid qid x 5 d    Dispense:  1 g    Refill:  0    Order Specific Question:   Supervising Provider    Answer:   Oren BeckmannGRUNZ, RYAN B [[id number]]       Sissy Goetzke, NP 09/20/15 1448

## 2015-11-30 ENCOUNTER — Telehealth: Payer: Self-pay | Admitting: Cardiovascular Disease

## 2015-11-30 DIAGNOSIS — E7849 Other hyperlipidemia: Secondary | ICD-10-CM

## 2015-11-30 DIAGNOSIS — Z79899 Other long term (current) drug therapy: Secondary | ICD-10-CM

## 2015-11-30 NOTE — Telephone Encounter (Signed)
Spoke to patient. He [facility]. Asked him if he wanted to repeat PSA - he declined - states he had recent urology exam w bloodwork, all normal. Aware I will reorder CMET and lipids and to fast prior to test. Pt voiced thanks and understanding.

## 2015-11-30 NOTE — Telephone Encounter (Signed)
New message   Pt verbalized that he is calling for a lab order

## 2015-11-30 NOTE — Telephone Encounter (Signed)
Lipid, CMET, PSA done 1 year ago. Pt returning for 1 year f/u. Routed to Dr. Royann Shiversroitoru for recommended previsit labwork.

## 2015-11-30 NOTE — Telephone Encounter (Signed)
Please reorder same. Thanks

## 2015-12-28 LAB — COMPREHENSIVE METABOLIC PANEL
ALBUMIN: 4.1 g/dL (ref 3.6–5.1)
ALT: 19 U/L (ref 9–46)
AST: 19 U/L (ref 10–35)
Alkaline Phosphatase: 37 U/L — ABNORMAL LOW (ref 40–115)
BUN: 23 mg/dL (ref 7–25)
CHLORIDE: 110 mmol/L (ref 98–110)
CO2: 25 mmol/L (ref 20–31)
Calcium: 9.2 mg/dL (ref 8.6–10.3)
Creat: 1.19 mg/dL (ref 0.70–1.33)
Glucose, Bld: 96 mg/dL (ref 65–99)
POTASSIUM: 4.1 mmol/L (ref 3.5–5.3)
Sodium: 141 mmol/L (ref 135–146)
TOTAL PROTEIN: 6.1 g/dL (ref 6.1–8.1)
Total Bilirubin: 0.5 mg/dL (ref 0.2–1.2)

## 2015-12-28 LAB — LIPID PANEL
CHOL/HDL RATIO: 6 ratio — AB (ref <5.0)
CHOLESTEROL: 143 mg/dL (ref <200)
HDL: 24 mg/dL — ABNORMAL LOW (ref >40)
LDL Cholesterol: 89 mg/dL (ref <100)
TRIGLYCERIDES: 152 mg/dL — AB (ref <150)
VLDL: 30 mg/dL (ref <30)

## 2016-01-04 ENCOUNTER — Encounter: Payer: Self-pay | Admitting: *Deleted

## 2016-01-05 ENCOUNTER — Encounter: Payer: Self-pay | Admitting: Cardiovascular Disease

## 2016-01-05 ENCOUNTER — Ambulatory Visit (INDEPENDENT_AMBULATORY_CARE_PROVIDER_SITE_OTHER): Payer: Managed Care, Other (non HMO) | Admitting: Cardiovascular Disease

## 2016-01-05 VITALS — BP 140/82 | HR 62 | Ht 74.0 in | Wt 254.1 lb

## 2016-01-05 DIAGNOSIS — R55 Syncope and collapse: Secondary | ICD-10-CM | POA: Diagnosis not present

## 2016-01-05 DIAGNOSIS — Z950 Presence of cardiac pacemaker: Secondary | ICD-10-CM | POA: Diagnosis not present

## 2016-01-05 DIAGNOSIS — E669 Obesity, unspecified: Secondary | ICD-10-CM | POA: Insufficient documentation

## 2016-01-05 DIAGNOSIS — N289 Disorder of kidney and ureter, unspecified: Secondary | ICD-10-CM

## 2016-01-05 DIAGNOSIS — E785 Hyperlipidemia, unspecified: Secondary | ICD-10-CM

## 2016-01-05 LAB — CUP PACEART INCLINIC DEVICE CHECK
Battery Voltage: 2.97 V
Brady Statistic AP VS Percent: 48.96 %
Brady Statistic AS VS Percent: 51.04 %
Brady Statistic RV Percent Paced: 0 %
Implantable Lead Implant Date: 20120501
Implantable Lead Implant Date: 20120501
Implantable Lead Location: 753859
Implantable Lead Model: 5086
Implantable Lead Model: 5086
Lead Channel Sensing Intrinsic Amplitude: 17.944 mV
Lead Channel Sensing Intrinsic Amplitude: 2.174 mV
Lead Channel Setting Pacing Amplitude: 2 V
MDC IDC LEAD LOCATION: 753860
MDC IDC MSMT LEADCHNL RA IMPEDANCE VALUE: 432 Ohm
MDC IDC MSMT LEADCHNL RV IMPEDANCE VALUE: 536 Ohm
MDC IDC PG IMPLANT DT: 20120501
MDC IDC SESS DTM: 20171128102040
MDC IDC SET LEADCHNL RV PACING AMPLITUDE: 2.5 V
MDC IDC SET LEADCHNL RV PACING PULSEWIDTH: 0.4 ms
MDC IDC SET LEADCHNL RV SENSING SENSITIVITY: 0.9 mV
MDC IDC STAT BRADY AP VP PERCENT: 0 %
MDC IDC STAT BRADY AS VP PERCENT: 0 %
MDC IDC STAT BRADY RA PERCENT PACED: 48.96 %

## 2016-01-05 NOTE — Progress Notes (Signed)
Cardiology Office Note    Date:  01/05/2016   ID:  [name], DOB 1955/12/01, MRN [id number]  PCP:  Lilyan Punt, MD  Cardiologist:   Thurmon Fair, MD   Chief Complaint  Patient presents with  . Follow-up    no chest pain, occassional shortness of breath, no edema, occassional cramping, no lightheaded or dizziness  . Follow-up    patient states he experiencing electrical shock going through his left hand-discuss with Dr C    History of Present Illness:  [name] is a 60 y.o. 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  He has not had syncopal events, shortness of breath, angina, edema or claudication since his last appointment. He continues to perform hard physical labor without much limitation. He denies claudication or focal neurological complaints, other than some paresthesias and jumpy muscles in his left hand. He has had previous cervical spine surgery.  His pacemaker shows normal device function. He has a Medtronic Revell MRI conditional device implanted in May 2012 with a current battery voltage of 2.97 V (RRT 2.81 V) since lead parameters are excellent. He only paces the atrium roughly 50% of the time and never requires ventricular pacing activity level is constant at 3-4 hours a day. His device has recorded a few episodes of high atrial rate that are consistent with sinus tachycardia. No true arrhythmias recorded.  His lipid parameters are within desirable range with the exception of a very low HDL cholesterol at 24. His weight is essentially unchanged for the last 4 years. BMI remains approximately 32.    Past Medical History:  Diagnosis Date  . Barrett's esophagus   . GERD (gastroesophageal reflux disease)   .  Hypercholesterolemia   . Hypotension   . Pacemaker   . Syncope     Past Surgical History:  Procedure Laterality Date  . birthmark removal     right thigh  . BLADDER SURGERY     stretched  . CARDIAC CATHETERIZATION  09/15/2006   Recommendation - empiric anti-reflux therapy  . CARDIOVASCULAR STRESS TEST  04/08/2011   No scintigraphic evidence of inducible myocardial ischemia. No lexiscan EKG changes. Non-diagnostic for ischemia.  . CERVICAL SPINE SURGERY     plates and screws  . LOWER EXTREMITY ARTERIAL DOPPLER  10/02/2006   No evidence of thrombus or thrombphlebitis.  [name] PACEMAKER INSERTION  06/08/2010   Medtronic Revo model #RVDR01, serial [id number]  . TONSILLECTOMY    . TRANSTHORACIC ECHOCARDIOGRAM  04/06/2011   EF 55-60%, normal  . WISDOM TOOTH EXTRACTION      Current Medications: Outpatient Medications Prior to Visit  Medication Sig Dispense Refill  . aspirin EC 81 MG tablet Take 81 mg by mouth daily.    . chlorpheniramine-pseudoephedrine-acetaminophen (SINE-OFF) 2-30-500 MG per tablet Take 2 tablets by mouth every 4 (four) hours as needed for allergies (or sinus pain).     . Cholecalciferol (VITAMIN D PO) Take 1 tablet by mouth daily.    . fenofibrate (TRICOR) 145 MG tablet TAKE 1 TABLET (145 MG TOTAL) BY MOUTH DAILY. 90 tablet 3  . Multiple Vitamin (MULTIVITAMIN) tablet Take 1 tablet by mouth daily.    . Naphazoline HCl (CLEAR EYES OP) Place 2 drops into both eyes daily as needed (for dry eyes).    [name] omeprazole (PRILOSEC) 20 MG  capsule TAKE ONE CAPSULE BY MOUTH EVERY DAY 90 capsule 3  . erythromycin ophthalmic ointment Place a 1/2 inch ribbon of ointment into the left lower eyelid qid x 5 d (Patient not taking: Reported on 01/05/2016) 1 g 0   No facility-administered medications prior to visit.      Allergies:   Other; Norvasc [amlodipine besylate]; Sulfonamide derivatives; and Levofloxacin   Social History   Social History  . Marital status: Married    Spouse name: N/A   . Number of children: 2  . Years of education: N/A   Occupational History  . 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 Smoking status: Former Smoker    Types: Cigarettes    Quit date: 11/27/1973  . Smokeless tobacco: Never Used  . Alcohol use No  . Drug use: No  . Sexual activity: Not Asked   Other Topics Concern  . None   Social History Narrative  . None     Family History:  The patient's family history includes COPD in his sister; Heart attack in his father and sister; Heart failure in his father; Hyperlipidemia in his brother and sister; Hypertension in his brother; Lymphoma in his brother; Ovarian cancer in his mother.   ROS:   Please see the history of present illness.    ROS All other systems reviewed and are negative.   PHYSICAL EXAM:   VS:  BP 140/82 (BP Location: Right Arm, Patient Position: Sitting, Cuff Size: Large)   Pulse 62   Ht 6\' 2"  (1.88 m)   Wt 254 lb 2 oz (115.3 kg)   BMI 32.63 kg/m    GEN: Well nourished, well developed, in no acute distress  HEENT: normal  Neck: no JVD, carotid bruits, or masses Cardiac: RRR; no murmurs, rubs, or gallops,no edema , healthy left subclavian pacemaker site Respiratory:  clear to auscultation bilaterally, normal work of breathing GI: soft, nontender, nondistended, + BS MS: no deformity or atrophy  Skin: warm and dry, no rash Neuro:  Alert and Oriented x 3, Strength and sensation are intact Psych: euthymic mood, full affect  Wt Readings from Last 3 Encounters:  01/05/16 254 lb 2 oz (115.3 kg)  01/23/15 255 lb 8 oz (115.9 kg)  12/30/14 257 lb 3.2 oz (116.7 kg)      Studies/Labs Reviewed:   EKG:  EKG is ordered today.  The ekg ordered today demonstrates Atrial paced, ventricular sensed otherwise normal  Recent Labs: 12/28/2015: ALT 19; BUN 23; Creat 1.19; Potassium 4.1; Sodium 141   Lipid Panel    Component Value Date/Time   CHOL 143 12/28/2015 0827   CHOL 170 12/20/2014 0938    TRIG 152 (H) 12/28/2015 0827   HDL 24 (L) 12/28/2015 0827   HDL 30 (L) 12/20/2014 0938   CHOLHDL 6.0 (H) 12/28/2015 0827   VLDL 30 12/28/2015 0827   LDLCALC 89 12/28/2015 0827   LDLCALC 117 (H) 12/20/2014 [id number]      ASSESSMENT:    1. Neurocardiogenic syncope   2. Dyslipidemia   3. Mild obesity   4. Renal insufficiency, secondary to dehydration? Cr 1.7 on admission   5. Pacemaker, MDT 5/12 for SB     neurocardioge PLAN:  In order of problems listed above:  1. Neurocardiogenic syncope: Well-controlled with pacemaker for the cardioinhibitory component. His blood pressure is borderline high, partly because I have encouraged him to stay very well-hydrated and have a relatively high sodium diet to avoid the vasodepressor related  symptoms. 2. HLP: HDL cholesterol is very low. I again talked to him about avoiding carbohydrates in his diet, especially high glycemic index foods. He is very physically active. 3. Obesity: Weight loss would likely lead to substantial improvement in his HDL 4. CKD: There is actually marked improvement in his renal function parameters which are now within normal range. 5. PPM: Normal pacemaker function. His device is MRI conditional. [facility] every 3 months and yearly office visit    Medication Adjustments/Labs and Tests Ordered: Current medicines are reviewed at length with the patient today.  Concerns regarding medicines are outlined above.  Medication changes, Labs and Tests ordered today are listed in the Patient Instructions below. Patient Instructions  Dr Royann Shiversroitoru recommends that you continue on your current medications as directed. Please refer to the Current Medication list given to you today.  Remote monitoring is used to monitor your Pacemaker of ICD from home. This monitoring reduces the number of office visits required to check your device to one time per year. It allows us to keep an eye on the functioning of your device to ensure it is working  properly. You are scheduled for a device check from home on Tuesday, February 28th, 2018. You may send your transmission at any time that day. If you have a wireless device, the transmission will be sent automatically. After your physician reviews your transmission, you will receive a postcard with your next transmission date.  Dr Royann Shiversroitoru recommends that you schedule a follow-up appointment in 12 months with a pacemaker check. You will receive a reminder letter in the mail two months in advance. If you don't receive a letter, please call our office to schedule the follow-up appointment.  If you need a refill on your cardiac medications before your next appointment, please call your pharmacy.    Signed, Thurmon FairMihai Brina Umeda, MD  01/05/2016 9:28 AM    [facility] [address], [place], [place]  [address] Phone: [phone]; Fax: [phone]

## 2016-01-05 NOTE — Patient Instructions (Signed)
Dr Royann Shiversroitoru recommends that you continue on your current medications as directed. Please refer to the Current Medication list given to you today.  Remote monitoring is used to monitor your Pacemaker of ICD from home. This monitoring reduces the number of office visits required to check your device to one time per year. It allows us to keep an eye on the functioning of your device to ensure it is working properly. You are scheduled for a device check from home on Tuesday, February 28th, 2018. You may send your transmission at any time that day. If you have a wireless device, the transmission will be sent automatically. After your physician reviews your transmission, you will receive a postcard with your next transmission date.  Dr Royann Shiversroitoru recommends that you schedule a follow-up appointment in 12 months with a pacemaker check. You will receive a reminder letter in the mail two months in advance. If you don't receive a letter, please call our office to schedule the follow-up appointment.  If you need a refill on your cardiac medications before your next appointment, please call your pharmacy.

## 2016-01-07 ENCOUNTER — Other Ambulatory Visit: Payer: Self-pay | Admitting: Gastroenterology

## 2016-01-08 NOTE — Telephone Encounter (Signed)
Patient needs office visit for further refills

## 2016-01-19 ENCOUNTER — Telehealth: Payer: Self-pay | Admitting: Cardiovascular Disease

## 2016-01-19 NOTE — Telephone Encounter (Signed)
PER DR CROITORU ,NO PRE MED NEEDED. INFORMED PAULETTE

## 2016-01-19 NOTE — Telephone Encounter (Signed)
Pt is on his way to the dentist to have a tooth extracted. Wife says she need you to call the dentist office and tell them he does need to be pre medicated because of his pacemaker. Please call Dr Lamont DowdyWheless Office-[phone].

## 2016-01-19 NOTE — Telephone Encounter (Signed)
New message  STAT  Pt is in dentist office for procedure  Paulette needs for you to return call to her immediately to know if they can proceed

## 2016-01-19 NOTE — Telephone Encounter (Signed)
Informed Dr Lamont DowdyWHELESS office ,patient prone to vagal syncope

## 2016-01-19 NOTE — Telephone Encounter (Signed)
Defer to Dr Royann Shiversroitoru? will contact  Dr Lamont DowdyWheless.

## 2016-01-19 NOTE — Telephone Encounter (Signed)
Does not need premeds. Alert them that he is prone to vagal syncope.

## 2016-02-10 ENCOUNTER — Other Ambulatory Visit: Payer: Self-pay | Admitting: Gastroenterology

## 2016-02-10 NOTE — Telephone Encounter (Signed)
Informed patient he is due for a follow visit since it has been two years since his last EGD. Informed patient that I can still send in a refill but he needs to schedule a office visit unless he wants to call his PCP and have them fill this medication. Patient states he will call his PCP and get them to fill.

## 2016-04-03 ENCOUNTER — Other Ambulatory Visit: Payer: Self-pay | Admitting: Cardiovascular Disease

## 2016-04-05 ENCOUNTER — Encounter: Payer: Managed Care, Other (non HMO) | Admitting: *Deleted

## 2016-04-05 ENCOUNTER — Telehealth: Payer: Self-pay | Admitting: Cardiology

## 2016-04-05 NOTE — Telephone Encounter (Signed)
LMOVM reminding pt to send remote transmission.   

## 2016-04-13 ENCOUNTER — Encounter: Payer: Self-pay | Admitting: Cardiology

## 2016-09-14 ENCOUNTER — Emergency Department (HOSPITAL_COMMUNITY): Payer: 59

## 2016-09-14 ENCOUNTER — Encounter (HOSPITAL_COMMUNITY): Payer: Self-pay | Admitting: *Deleted

## 2016-09-14 ENCOUNTER — Emergency Department (HOSPITAL_COMMUNITY)
Admission: EM | Admit: 2016-09-14 | Discharge: 2016-09-14 | Disposition: A | Discharge status: Home / Self Care | Payer: 59 | Attending: Emergency Medicine | Admitting: Emergency Medicine

## 2016-09-14 DIAGNOSIS — Z950 Presence of cardiac pacemaker: Secondary | ICD-10-CM | POA: Insufficient documentation

## 2016-09-14 DIAGNOSIS — Z87891 Personal history of nicotine dependence: Secondary | ICD-10-CM | POA: Diagnosis not present

## 2016-09-14 DIAGNOSIS — R072 Precordial pain: Secondary | ICD-10-CM | POA: Insufficient documentation

## 2016-09-14 DIAGNOSIS — R0609 Other forms of dyspnea: Secondary | ICD-10-CM | POA: Diagnosis not present

## 2016-09-14 DIAGNOSIS — Z7982 Long term (current) use of aspirin: Secondary | ICD-10-CM | POA: Diagnosis not present

## 2016-09-14 DIAGNOSIS — Z79899 Other long term (current) drug therapy: Secondary | ICD-10-CM | POA: Insufficient documentation

## 2016-09-14 DIAGNOSIS — R079 Chest pain, unspecified: Secondary | ICD-10-CM | POA: Diagnosis present

## 2016-09-14 LAB — I-STAT TROPONIN, ED: TROPONIN I, POC: 0.01 ng/mL (ref 0.00–0.08)

## 2016-09-14 LAB — BASIC METABOLIC PANEL
Anion gap: 7 (ref 5–15)
BUN: 19 mg/dL (ref 6–20)
CALCIUM: 9.8 mg/dL (ref 8.9–10.3)
CHLORIDE: 111 mmol/L (ref 101–111)
CO2: 24 mmol/L (ref 22–32)
CREATININE: 1.34 mg/dL — AB (ref 0.61–1.24)
GFR calc non Af Amer: 56 mL/min — ABNORMAL LOW (ref >60)
GLUCOSE: 136 mg/dL — AB (ref 65–99)
Potassium: 3.8 mmol/L (ref 3.5–5.1)
Sodium: 142 mmol/L (ref 135–145)

## 2016-09-14 LAB — CBC
HCT: 39.3 % (ref 39.0–52.0)
Hemoglobin: 13.8 g/dL (ref 13.0–17.0)
MCH: 32.3 pg (ref 26.0–34.0)
MCHC: 35.1 g/dL (ref 30.0–36.0)
MCV: 92 fL (ref 78.0–100.0)
PLATELETS: 184 10*3/uL (ref 150–400)
RBC: 4.27 MIL/uL (ref 4.22–5.81)
RDW: 12.6 % (ref 11.5–15.5)
WBC: 6.7 10*3/uL (ref 4.0–10.5)

## 2016-09-14 LAB — TROPONIN I: Troponin I: <0.03 ng/mL (ref <0.03)

## 2016-09-14 MED ORDER — ASPIRIN 81 MG PO CHEW
324.0000 mg | CHEWABLE_TABLET | Freq: Once | ORAL | Status: AC
  Administered 2016-09-14: 324 mg via ORAL
  Filled 2016-09-14: qty 4

## 2016-09-14 NOTE — ED Provider Notes (Signed)
[facility]-EMERGENCY DEPT Provider Note   CSN: [id number] Arrival date & time: 09/14/16  0054     History   Chief Complaint Chief Complaint  Patient presents with  . Chest Pain    HPI [name] is a 61 y.o. male.  [name] is a 61 y.o. Male with a history of hyperlipidemia, and neurocardiogenic syncope status post medtronic pacemaker who presents to the ED complaining of chest pressure and fatigue starting last night. 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 He reports some continued mild chest pressure currently. He denies any current chest pain. He reports having intermittent shortness of breath that has been ongoing for several weeks. He reports he has dyspnea on exertion typically. He also reports feeling more fatigued recently and becomes short of breath when walking a half mile to his car after work. He also noted some bilateral lower extremity edema. He denies any current shortness of breath. He has a pacemaker. No history of MI. History of hyperlipidemia. He denies recent long travel or recent surgery. He denies fevers, cough, hemoptysis, calf pain, current shortness of breath, palpitations, abdominal pain, vomiting, diarrhea, syncope or rashes.   The history is provided by the patient, medical records and the spouse. No language interpreter was used.  Chest Pain   Associated symptoms include shortness of breath. Pertinent negatives include no abdominal pain, no back pain, no cough, no fever, no headaches, no numbness, no palpitations, no vomiting and no weakness.    Past Medical History:  Diagnosis Date  . Barrett's esophagus   . GERD (gastroesophageal reflux disease)   . Hypercholesterolemia   . Hypotension   . Pacemaker   . Syncope     Patient Active Problem List   Diagnosis Date Noted  . Mild obesity 01/05/2016  . Neurocardiogenic syncope 12/16/2012  . OSA (obstructive sleep  apnea) - presumptive diagnosis 12/16/2012  . Dyspnea 11/09/2012  . Acute prostatitis 05/04/2012  . Chest pain 04/06/2011  . Pacemaker, MDT 5/12 for SB 04/06/2011  . Dehydration 04/06/2011  . Renal insufficiency, secondary to dehydration? Cr 1.7 on admission 04/06/2011  . Dyslipidemia 04/06/2011  . Family history of coronary artery disease 04/06/2011  . Hx of cardiac cath in 2008 and April 2012 showing no significant CAD 04/06/2011    Past Surgical History:  Procedure Laterality Date  . birthmark removal     right thigh  . BLADDER SURGERY     stretched  . CARDIAC CATHETERIZATION  09/15/2006   Recommendation - empiric anti-reflux therapy  . CARDIOVASCULAR STRESS TEST  04/08/2011   No scintigraphic evidence of inducible myocardial ischemia. No lexiscan EKG changes. Non-diagnostic for ischemia.  . CERVICAL SPINE SURGERY     plates and screws  . LOWER EXTREMITY ARTERIAL DOPPLER  10/02/2006   No evidence of thrombus or thrombphlebitis.  [name]. PACEMAKER INSERTION  06/08/2010   Medtronic Revo model #RVDR01, serial [id number] H  . TONSILLECTOMY    . TRANSTHORACIC ECHOCARDIOGRAM  04/06/2011   EF 55-60%, normal  . WISDOM TOOTH EXTRACTION         Home Medications    Prior to Admission medications   Medication Sig Start Date End Date Taking? Authorizing Provider  aspirin EC 81 MG tablet Take 81 mg by mouth daily.    [provider]  chlorpheniramine-pseudoephedrine-acetaminophen (SINE-OFF) 2-30-500 MG per tablet Take 2 tablets by mouth every 4 (four) hours as needed for allergies (or sinus pain).  [provider]  Cholecalciferol (VITAMIN D PO) Take 1 tablet by mouth daily.    [provider]  fenofibrate (TRICOR) 145 MG tablet TAKE 1 TABLET (145 MG TOTAL) BY MOUTH DAILY. 04/05/16   Croitoru, Mihai, MD  Multiple Vitamin (MULTIVITAMIN) tablet Take 1 tablet by mouth daily.    [provider]  Naphazoline HCl (CLEAR EYES OP) Place 2 drops into both eyes daily as  needed (for dry eyes).    [provider]  omeprazole (PRILOSEC) 20 MG capsule TAKE ONE CAPSULE BY MOUTH EVERY DAY 01/08/16   Rachael Fee, MD    Family History Family History  Problem Relation Age of Onset  . Ovarian cancer Mother   . Heart failure Father   . Heart attack Father   . Heart attack Sister   . Lymphoma Brother        x 2  . COPD Sister   . Hypertension Brother   . Hyperlipidemia Brother   . Hyperlipidemia Sister   . Colon cancer Neg Hx   . Stomach cancer Neg Hx     Social History Social History  Substance Use Topics  . Smoking status: Former Smoker    Types: Cigarettes    Quit date: 11/27/1973  . Smokeless tobacco: Never Used  . Alcohol use No     Allergies   Other; Norvasc [amlodipine besylate]; Sulfonamide derivatives; and Levofloxacin   Review of Systems Review of Systems  Constitutional: Positive for fatigue. Negative for chills and fever.  HENT: Negative for congestion and sore throat.   Eyes: Negative for visual disturbance.  Respiratory: Positive for shortness of breath. Negative for cough and wheezing.   Cardiovascular: Positive for chest pain. Negative for palpitations.  Gastrointestinal: Negative for abdominal pain, diarrhea and vomiting.  Genitourinary: Negative for dysuria.  Musculoskeletal: Negative for back pain and neck pain.  Skin: Negative for rash.  Neurological: Negative for weakness, light-headedness, numbness and headaches.     Physical Exam Updated Vital Signs BP 130/90 (BP Location: Right Arm)   Pulse 60   Temp 98.7 F (37.1 C) (Oral)   Resp 18   SpO2 99%   Physical Exam  Constitutional: He appears well-developed and well-nourished. No distress.  Nontoxic appearing.  HENT:  Head: Normocephalic and atraumatic.  Mouth/Throat: Oropharynx is clear and moist.  Eyes: Pupils are equal, round, and reactive to light. Conjunctivae are normal. Right eye exhibits no discharge. Left eye exhibits no discharge.    Neck: Neck supple. No JVD present. No tracheal deviation present.  Cardiovascular: Normal rate, regular rhythm, normal heart sounds and intact distal pulses.  Exam reveals no gallop and no friction rub.   No murmur heard. Bilateral radial, posterior tibialis and dorsalis pedis pulses are intact.    Pulmonary/Chest: Effort normal and breath sounds normal. No stridor. No respiratory distress. He has no wheezes. He has no rales.  Lungs are clear to ascultation bilaterally. Symmetric chest expansion bilaterally. No increased work of breathing. No rales or rhonchi.    Abdominal: Soft. There is no tenderness. There is no guarding.  Musculoskeletal: Normal range of motion. He exhibits edema. He exhibits no tenderness or deformity.  Mild bilateral ankle edema noted. No calf edema or tenderness bilaterally.  Lymphadenopathy:    He has no cervical adenopathy.  Neurological: He is alert. No sensory deficit. Coordination normal.  Skin: Skin is warm and dry. Capillary refill takes less than 2 seconds. No rash noted. He is not diaphoretic. No erythema. No pallor.  Psychiatric: He has a normal mood and affect. His behavior is normal.  Nursing note and vitals reviewed.    ED Treatments / Results  Labs (all labs ordered are listed, but only abnormal results are displayed) Labs Reviewed  BASIC METABOLIC PANEL - Abnormal; Notable for the following:       Result Value   Glucose, Bld 136 (*)    Creatinine, Ser 1.34 (*)    GFR calc non Af Amer 56 (*)    All other components within normal limits  CBC  TROPONIN I  I-STAT TROPONIN, ED    EKG  EKG Interpretation  Date/Time:  Wednesday September 14 2016 00:56:31 EDT Ventricular Rate:  65 PR Interval:  174 QRS Duration: 104 QT Interval:  390 QTC Calculation: 405 R Axis:     Text Interpretation:  Normal sinus rhythm Normal ECG No significant change since last tracing Confirmed by Marily Memos [phone]) on 09/14/2016 7:45:03 AM       Radiology Dg  Chest 2 View  Result Date: 09/14/2016 CLINICAL DATA:  LEFT chest discomfort, nausea and weakness. EXAM: CHEST  2 VIEW COMPARISON:  Chest radiograph April 06, 2011 FINDINGS: Cardiomediastinal silhouette is normal. No pleural effusions or focal consolidations. Trachea projects midline and there is no pneumothorax. Soft tissue planes and included osseous structures are non-suspicious. Dual lead LEFT cardiac pacemaker in situ. ACDF. IMPRESSION: Stable examination:  No acute cardiopulmonary process. Electronically Signed   By: Awilda Metro M.D.   On: 09/14/2016 01:41    Procedures Procedures (including critical care time)  Medications Ordered in ED Medications - No data to display   Initial Impression / Assessment and Plan / ED Course  I have reviewed the triage vital signs and the nursing notes.  Pertinent labs & imaging results that were available during my care of the patient were reviewed by me and considered in my medical decision making (see chart for details).    This is a 61 y.o. Male with a history of hyperlipidemia, and neurocardiogenic syncope status post medtronic pacemaker who presents to the ED complaining of chest pressure and fatigue starting last night. 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 He reports some continued mild chest pressure currently. He denies any current chest pain. He reports having intermittent shortness of breath that has been ongoing for several weeks. He reports he has dyspnea on exertion typically. He also reports feeling more fatigued recently and becomes short of breath when walking a half mile to his car after work. He also noted some bilateral lower extremity edema. He denies any current shortness of breath. He has a pacemaker. No history of MI. History of hyperlipidemia. No PE risk factors.  On exam patient is afebrile nontoxic appearing. He has no tachypnea,  tachycardia or hypoxia on exam. EKG shows normal sinus rhythm and is unchanged from his previous. Initial troponin is not elevated. CBC in BMP are unremarkable. Unchanged from previous testing. Chest x-ray is unremarkable. Will obtain delta troponin interrogate the patient's pacemaker. Pacemaker interrogation reveals no remarkable episodes. Pacemaker is functioning around 51% of the time. He does have some episodes of tachycardia around heart rate of 140. He is not orthostatic. Repeat troponin is not elevated.  The patient's cardiologist Dr. Royann Shivers happened to stop by and saw the patient while he was in the ER seeing another patient. He also evaluated pacemaker interrogation. He thinks the patient's symptoms are related to his  neurocardiogenic syncope and the pacemaker is preventing him from passing out. He is reassured and agrees that patient can be discharged and follow up in his clinic. I discussed test results and findings with the patient. Patient family agree with plan for discharge today.  Strict and specific return precautions discussed. I advised the patient to follow-up with their primary care provider this week. I advised the patient to return to the emergency department with new or worsening symptoms or new concerns. The patient verbalized understanding and agreement with plan.   This patient was discussed with Dr. Clayborne Dana who agrees with assessment and plan.   Final Clinical Impressions(s) / ED Diagnoses   Final diagnoses:  Precordial pain  Pacemaker  DOE (dyspnea on exertion)    New Prescriptions New Prescriptions   No medications on file     Lorene Dy 09/16/16 1928    Mesner, Barbara Cower, MD 09/19/16 [phone]

## 2016-09-14 NOTE — ED Notes (Signed)
Pacemaker is functioning good. 2 days ago, HR SVT 140, this has occurred several times. Is dual chamber.

## 2016-09-14 NOTE — ED Notes (Signed)
Medtronic pacemaker interrogated °

## 2016-09-14 NOTE — ED Triage Notes (Signed)
Pt c/o chest discomfort onset an hour ago while at work. Pt denies pain; endorses nausea. Hx of pacemaker placement for bradycardia/syncope

## 2016-10-07 ENCOUNTER — Ambulatory Visit (INDEPENDENT_AMBULATORY_CARE_PROVIDER_SITE_OTHER): Payer: 59 | Admitting: Physician Assistant

## 2016-10-07 ENCOUNTER — Encounter: Payer: Self-pay | Admitting: Physician Assistant

## 2016-10-07 VITALS — Ht 74.0 in | Wt 253.0 lb

## 2016-10-07 DIAGNOSIS — R0609 Other forms of dyspnea: Secondary | ICD-10-CM | POA: Diagnosis not present

## 2016-10-07 DIAGNOSIS — Z950 Presence of cardiac pacemaker: Secondary | ICD-10-CM | POA: Diagnosis not present

## 2016-10-07 DIAGNOSIS — R55 Syncope and collapse: Secondary | ICD-10-CM

## 2016-10-07 NOTE — Patient Instructions (Signed)
Medication Instructions:  Your physician recommends that you continue on your current medications as directed. Please refer to the Current Medication list given to you today. If you need a refill on your cardiac medications before your next appointment, please call your pharmacy.  Follow-Up: Your physician wants you to follow-up in: KEEP SCHEDULED APPT WITH DR Royann ShiversROITORU 12-21-2016 @ 8AM.   Special Instructions: GET A DAY PLANNER TO LOG SYMPTOM DIARY-DAILY ENSURE ADEQUATE SALT AND WATER INTAKE ESPECIALLY  AT WORK  GET SOME GATORAID AND INCREASE WATER INTAKE WE WILL CALLBACK IF ANYTHING IS NEEDED WITH DOWNLOAD WILL DISCUSS WITH DR Royann ShiversROITORU AND CALL WITH UPDATE   Thank you for choosing [facility]!!

## 2016-10-07 NOTE — Progress Notes (Signed)
Cardiology Office Note   Date:  10/07/2016   ID:  [name], DOB [birth year], MRN [id number]  PCP:  Babs Sciara, MD  Cardiologist:  Dr. Royann Shivers, 12/28/2015  Theodore Demark, PA-C   Chief Complaint  Patient presents with  . Follow-up    SOB    History of Present Illness: [name] is a 61 y.o. 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 He also reported mild lower extremity edema. Pacemaker interrogation showed pacing 51% of time and episodes of tachycardia to 140. K+ 3.8, BUN 19, Cr 1.34 (range 1.19-1.53), ECG SR, unchanged  [name] presents for cardiology follow up.  He went to the ER because of the same sx that preceded his PPM. He felt "funny", light-headed, diaphoretic, mild-mod SOB. He was at work in a warehouse, had to leave. He has had to leave work 3 times recently.   He feels DOE sometimes, not consistently, feels he has poor exercise tolerance. If he is out in the sun, he is not able to do as much as previously.   He does not feel limited when he walks, but if he does anything more strenuous, he tires quickly. He gets SOB and has presyncope. At work, it is hot, but he tolerates this well. He works third shift, so is not out in the sun.   Sometimes his BP drops, he feels this, that may account for his sx. He is trying to make sure he gets enough salt and water daily. He drinks 2 bottles of water and 2 bottles of Gatorade while at work, drinks a 3rd bottle of water on his way home.    Past Medical History:  Diagnosis Date  . Brittay Mogle's esophagus   . GERD (gastroesophageal reflux disease)   .  Hypercholesterolemia   . Hypotension   . Pacemaker   . Syncope     Past Surgical History:  Procedure Laterality Date  . birthmark removal     right thigh  . BLADDER SURGERY     stretched  . CARDIAC CATHETERIZATION  09/15/2006   Recommendation - empiric anti-reflux therapy  . CARDIOVASCULAR STRESS TEST  04/08/2011   No scintigraphic evidence of inducible myocardial ischemia. No lexiscan EKG changes. Non-diagnostic for ischemia.  . CERVICAL SPINE SURGERY     plates and screws  . LOWER EXTREMITY ARTERIAL DOPPLER  10/02/2006   No evidence of thrombus or thrombphlebitis.  [name] PACEMAKER INSERTION  06/08/2010   Medtronic Revo model #RVDR01, serial [id number]  . TONSILLECTOMY    . TRANSTHORACIC ECHOCARDIOGRAM  04/06/2011   EF 55-60%, normal  . WISDOM TOOTH EXTRACTION      Current Outpatient Prescriptions  Medication Sig Dispense Refill  . aspirin EC 81 MG tablet Take 81 mg by mouth daily.    . chlorpheniramine-pseudoephedrine-acetaminophen (SINE-OFF) 2-30-500 MG per tablet Take 2 tablets by mouth every 4 (four) hours as needed for allergies (or sinus pain).     . Cholecalciferol (VITAMIN D PO) Take 1 tablet by mouth daily.    . fenofibrate (TRICOR) 145 MG tablet TAKE 1 TABLET (145 MG TOTAL) BY MOUTH DAILY. 90 tablet 3  . Multiple Vitamin (MULTIVITAMIN) tablet Take 1 tablet by  mouth daily.    . Naphazoline HCl (CLEAR EYES OP) Place 2 drops into both eyes daily as needed (for dry eyes).    [name]. omeprazole (PRILOSEC) 20 MG capsule TAKE ONE CAPSULE BY MOUTH EVERY DAY 30 capsule 1   No current facility-administered medications for this visit.     Allergies:   Other; Norvasc [amlodipine besylate]; Levofloxacin; and Sulfonamide derivatives    Social History:  The patient  reports that he quit smoking about 42 years ago. His smoking use included Cigarettes. He has never used smokeless tobacco. He reports that he does not drink alcohol or use drugs.   Family History:  The patient's family history  includes COPD in his sister; Heart attack in his father and sister; Heart failure in his father; Hyperlipidemia in his brother and sister; Hypertension in his brother; Lymphoma in his brother; Ovarian cancer in his mother.    ROS:  Please see the history of present illness. All other systems are reviewed and negative.    PHYSICAL EXAM: VS:  Ht 6\' 2"  (1.88 m)   Wt 253 lb (114.8 kg)   BMI 32.48 kg/m  , BMI Body mass index is 32.48 kg/m. GEN: Well nourished, well developed, male in no acute distress  HEENT: normal for age  Neck: no JVD, no carotid bruit, no masses Cardiac: RRR; no murmur, no rubs, or gallops Respiratory:  clear to auscultation bilaterally, normal work of breathing GI: soft, nontender, nondistended, + BS MS: no deformity or atrophy; no edema; distal pulses are 2+ in all 4 extremities   Skin: warm and dry, no rash Neuro:  Strength and sensation are intact Psych: euthymic mood, full affect   EKG:  EKG is not ordered today. ECG from 08/08 ER visit reviewed, SR, no pacing spikes seen, no change in QRS complexes from 12/2015   Recent Labs: 12/28/2015: ALT 19 09/14/2016: BUN 19; Creatinine, Ser 1.34; Hemoglobin 13.8; Platelets 184; Potassium 3.8; Sodium 142    Lipid Panel    Component Value Date/Time   CHOL 143 12/28/2015 0827   CHOL 170 12/20/2014 0938   TRIG 152 (H) 12/28/2015 0827   HDL 24 (L) 12/28/2015 0827   HDL 30 (L) 12/20/2014 0938   CHOLHDL 6.0 (H) 12/28/2015 0827   VLDL 30 12/28/2015 0827   LDLCALC 89 12/28/2015 0827   LDLCALC 117 (H) 12/20/2014 0938     Wt Readings from Last 3 Encounters:  10/07/16 253 lb (114.8 kg)  01/05/16 254 lb 2 oz (115.3 kg)  01/23/15 255 lb 8 oz (115.9 kg)     Other studies Reviewed: Additional studies/ records that were reviewed today include: office notes, hospital records and testing.  ASSESSMENT AND PLAN:  1.  Near-syncope: unclear if vasovagal or arrhythmia. Device interrogated, report pending. Asked pt to keep a  symptom diary. He is not volume overloaded by exam. No hx low Na levels, electrolytes were ok at ER visit but Cr was a little high.   Requested pt keep a symptom diary. Requested he make sure he gets enough water/salt, he has no volume overload by exam.   Consider cortisol level vs challenge, no TSH seen since 2013, but it was normal then.  2. DOE: He is experiencing this pretty consistently. He feels his ability to exert himself has decreased recently. He is not sure why. Last MV was 2013. No volume overload by exam, EF normal on echo 2014.  If these sx are not related to HR, PPM function, consider repeat stress test  or Cath.   Current medicines are reviewed at length with the patient today.  The patient does not have concerns regarding medicines.  The following changes have been made:  no change  Labs/ tests ordered today include:  No orders of the defined types were placed in this encounter.   Disposition:   FU with Dr. Royann Shivers    Signed, Leanna Battles  10/07/2016 9:57 AM    [facility] Phone: [phone]; Fax: [phone]  This note was written with the assistance of speech recognition software. Please excuse any transcriptional errors.

## 2016-12-13 ENCOUNTER — Telehealth: Payer: Self-pay | Admitting: Family Medicine

## 2016-12-13 DIAGNOSIS — Z79899 Other long term (current) drug therapy: Secondary | ICD-10-CM

## 2016-12-13 DIAGNOSIS — Z125 Encounter for screening for malignant neoplasm of prostate: Secondary | ICD-10-CM

## 2016-12-13 DIAGNOSIS — D649 Anemia, unspecified: Secondary | ICD-10-CM

## 2016-12-13 DIAGNOSIS — Z13220 Encounter for screening for lipoid disorders: Secondary | ICD-10-CM

## 2016-12-13 NOTE — Telephone Encounter (Signed)
Patient has an appointment on 01/04/17 with Dr. Lorin PicketScott.  He wants to know if he is due for labs?

## 2016-12-13 NOTE — Telephone Encounter (Signed)
Lipid, liver, metabolic 7, PSA, CBC-hyperlipidemia screening, anemia

## 2016-12-13 NOTE — Telephone Encounter (Signed)
Last time pt had labs 09/14/2016 Troponin I,Cbc,Bmet. Please advise. Does pt need labs drawn?

## 2016-12-14 NOTE — Telephone Encounter (Signed)
I called and left a vm to r/c. 

## 2016-12-21 ENCOUNTER — Encounter: Payer: 59 | Admitting: Cardiovascular Disease

## 2016-12-23 ENCOUNTER — Encounter: Payer: Self-pay | Admitting: *Deleted

## 2016-12-26 ENCOUNTER — Other Ambulatory Visit: Payer: Self-pay | Admitting: Family Medicine

## 2016-12-26 LAB — CBC WITH DIFFERENTIAL/PLATELET
BASOS ABS: 29 {cells}/uL (ref 0–200)
Basophils Relative: 0.5 %
EOS PCT: 0.7 %
Eosinophils Absolute: 40 cells/uL (ref 15–500)
HCT: 42.2 % (ref 38.5–50.0)
Hemoglobin: 14.5 g/dL (ref 13.2–17.1)
Lymphs Abs: 1978 cells/uL (ref 850–3900)
MCH: 32.1 pg (ref 27.0–33.0)
MCHC: 34.4 g/dL (ref 32.0–36.0)
MCV: 93.4 fL (ref 80.0–100.0)
MONOS PCT: 7.4 %
MPV: 11.1 fL (ref 7.5–12.5)
NEUTROS PCT: 56.7 %
Neutro Abs: 3232 cells/uL (ref 1500–7800)
PLATELETS: 171 10*3/uL (ref 140–400)
RBC: 4.52 10*6/uL (ref 4.20–5.80)
RDW: 12.2 % (ref 11.0–15.0)
Total Lymphocyte: 34.7 %
WBC mixed population: 422 cells/uL (ref 200–950)
WBC: 5.7 10*3/uL (ref 3.8–10.8)

## 2016-12-26 LAB — HEPATIC FUNCTION PANEL
AG Ratio: 2.1 (calc) (ref 1.0–2.5)
ALKALINE PHOSPHATASE (APISO): 39 U/L — AB (ref 40–115)
ALT: 22 U/L (ref 9–46)
AST: 25 U/L (ref 10–35)
Albumin: 4.2 g/dL (ref 3.6–5.1)
BILIRUBIN DIRECT: 0.2 mg/dL (ref 0.0–0.2)
BILIRUBIN TOTAL: 0.7 mg/dL (ref 0.2–1.2)
Globulin: 2 g/dL (calc) (ref 1.9–3.7)
Indirect Bilirubin: 0.5 mg/dL (calc) (ref 0.2–1.2)
Total Protein: 6.2 g/dL (ref 6.1–8.1)

## 2016-12-26 LAB — BASIC METABOLIC PANEL WITH GFR
BUN: 24 mg/dL (ref 7–25)
CALCIUM: 9.3 mg/dL (ref 8.6–10.3)
CHLORIDE: 109 mmol/L (ref 98–110)
CO2: 26 mmol/L (ref 20–32)
CREATININE: 1.21 mg/dL (ref 0.70–1.33)
GFR, EST AFRICAN AMERICAN: 75 mL/min/{1.73_m2} (ref >=60)
GFR, EST NON AFRICAN AMERICAN: 65 mL/min/{1.73_m2} (ref >=60)
Glucose, Bld: 103 mg/dL — ABNORMAL HIGH (ref 65–99)
POTASSIUM: 4.1 mmol/L (ref 3.5–5.3)
SODIUM: 142 mmol/L (ref 135–146)

## 2016-12-26 LAB — PSA: PSA: 0.7 ng/mL (ref <=4.0)

## 2016-12-26 LAB — LIPID PANEL
CHOLESTEROL: 158 mg/dL (ref <200)
HDL: 31 mg/dL — AB (ref >40)
LDL CHOLESTEROL (CALC): 106 mg/dL — AB
Non-HDL Cholesterol (Calc): 127 mg/dL (calc) (ref <130)
Total CHOL/HDL Ratio: 5.1 (calc) — ABNORMAL HIGH (ref <5.0)
Triglycerides: 110 mg/dL (ref <150)

## 2017-01-04 ENCOUNTER — Encounter: Payer: Self-pay | Admitting: Family Medicine

## 2017-01-04 ENCOUNTER — Ambulatory Visit (INDEPENDENT_AMBULATORY_CARE_PROVIDER_SITE_OTHER): Payer: 59 | Admitting: Family Medicine

## 2017-01-04 VITALS — BP 130/86 | Ht 74.0 in | Wt 255.8 lb

## 2017-01-04 DIAGNOSIS — Z0000 Encounter for general adult medical examination without abnormal findings: Secondary | ICD-10-CM

## 2017-01-04 NOTE — Progress Notes (Signed)
   Subjective:    Patient ID: [name], male    DOB: [birth year], 61 y.o.   MRN: [id number]  HPI  The patient comes in today for a wellness visit.    A review of their health history was completed.  A review of medications was also completed.  Any needed refills; yes  Eating habits: eating pretty good  Falls/  MVA accidents in past few months: none  Regular exercise: at work Patient was advised to fitted walking and exercise for his overall health Specialist pt sees on regular basis: cardiology  Preventative health issues were discussed.   Additional concerns: none Lab work was reviewed with patient in detail patient advised to minimize starches and sugars and eat a healthy diet  Review of Systems  Constitutional: Negative for activity change, appetite change and fever.  HENT: Negative for congestion and rhinorrhea.   Eyes: Negative for discharge.  Respiratory: Negative for cough and wheezing.   Cardiovascular: Negative for chest pain.  Gastrointestinal: Negative for abdominal pain, blood in stool and vomiting.  Genitourinary: Negative for difficulty urinating and frequency.  Musculoskeletal: Negative for neck pain.  Skin: Negative for rash.  Allergic/Immunologic: Negative for environmental allergies and food allergies.  Neurological: Negative for weakness and headaches.  Psychiatric/Behavioral: Negative for agitation.       Objective:   Physical Exam  Constitutional: He appears well-developed and well-nourished.  HENT:  Head: Normocephalic and atraumatic.  Right Ear: External ear normal.  Left Ear: External ear normal.  Nose: Nose normal.  Mouth/Throat: Oropharynx is clear and moist.  Eyes: EOM are normal. Pupils are equal, round, and reactive to light.  Neck: Normal range of motion. Neck supple. No thyromegaly present.  Cardiovascular: Normal rate, regular rhythm and normal heart sounds.  No murmur heard. Pulmonary/Chest: Effort normal and breath sounds  normal. No respiratory distress. He has no wheezes.  Abdominal: Soft. Bowel sounds are normal. He exhibits no distension and no mass. There is no tenderness.  Genitourinary: Penis normal.  Musculoskeletal: Normal range of motion. He exhibits no edema.  Lymphadenopathy:    He has no cervical adenopathy.  Neurological: He is alert. He exhibits normal muscle tone.  Skin: Skin is warm and dry. No erythema.  Psychiatric: He has a normal mood and affect. His behavior is normal. Judgment normal.   Mild BPH he does get up a couple times at night to pee but he states is not bad his PSA looks good no nodules felt       Assessment & Plan:  Adult wellness-complete.wellness physical was conducted today. Importance of diet and exercise were discussed in detail. In addition to this a discussion regarding safety was also covered. We also reviewed over immunizations and gave recommendations regarding current immunization needed for age. In addition to this additional areas were also touched on including: Preventative health exams needed: Colonoscopy he is due by 2021 he states that his gastroenterologist wanted him to do a colonoscopy and EGD early next year and told the patient to call him for the appointment so therefore the patient will help set this up if he needs our help he will let us know Patient was counseled to try to lose weight eat healthy watch diet follow-up in 1 year for a wellness plus lab work at that time Patient was advised yearly wellness exam

## 2017-01-04 NOTE — Patient Instructions (Signed)
Overall your health check is good.  Your blood pressure is good. As we discussed your cholesterol profile overall looks good.  It is important to eat healthy minimizing starches and staying physically active.  If possible to bring your weight down over the next 12 months would be beneficial.  If you need any assistance setting up colonoscopy please let us know.  Otherwise we will see you back in 1 year.  Should you decide to do the flu vaccine please contact us we will be happy to give it to you.

## 2017-01-19 ENCOUNTER — Encounter: Payer: Self-pay | Admitting: Gastroenterology

## 2017-03-03 ENCOUNTER — Other Ambulatory Visit: Payer: Self-pay

## 2017-03-03 ENCOUNTER — Ambulatory Visit (AMBULATORY_SURGERY_CENTER): Payer: Self-pay | Admitting: *Deleted

## 2017-03-03 ENCOUNTER — Encounter: Payer: Self-pay | Admitting: Gastroenterology

## 2017-03-03 VITALS — Ht 74.0 in | Wt 262.2 lb

## 2017-03-03 DIAGNOSIS — K22719 Barrett's esophagus with dysplasia, unspecified: Secondary | ICD-10-CM

## 2017-03-03 NOTE — Progress Notes (Signed)
No egg or soy allergy known to patient  No issues with past sedation with any surgeries  or procedures, no intubation problems  No diet pills per patient No home 02 use per patient  No blood thinners per patient  Pt denies issues with constipation  No A fib or A flutter Pt. Has a Pacemaker EMMI video sent to pt's e mail pt. declined

## 2017-03-17 ENCOUNTER — Other Ambulatory Visit: Payer: Self-pay

## 2017-03-17 ENCOUNTER — Encounter: Payer: Self-pay | Admitting: Gastroenterology

## 2017-03-17 ENCOUNTER — Ambulatory Visit (AMBULATORY_SURGERY_CENTER): Payer: 59 | Admitting: Gastroenterology

## 2017-03-17 VITALS — BP 107/76 | HR 64 | Temp 86.6°F | Resp 12 | Ht 74.0 in | Wt 262.0 lb

## 2017-03-17 DIAGNOSIS — Z8719 Personal history of other diseases of the digestive system: Secondary | ICD-10-CM

## 2017-03-17 DIAGNOSIS — K2270 Barrett's esophagus without dysplasia: Secondary | ICD-10-CM

## 2017-03-17 DIAGNOSIS — K229 Disease of esophagus, unspecified: Secondary | ICD-10-CM

## 2017-03-17 MED ORDER — OMEPRAZOLE 20 MG PO CPDR: 20.0000 mg | DELAYED_RELEASE_CAPSULE | Freq: Every day | ORAL | 3 refills | Status: DC

## 2017-03-17 MED ORDER — SODIUM CHLORIDE 0.9 % IV SOLN: 500.0000 mL | Freq: Once | INTRAVENOUS | Status: DC

## 2017-03-17 NOTE — Progress Notes (Signed)
Called to room to assist during endoscopic procedure.  Patient ID and intended procedure confirmed with present staff. Received instructions for my participation in the procedure from the performing physician.  

## 2017-03-17 NOTE — Op Note (Signed)
[facility] Patient Name: [name] Procedure Date: 03/17/2017 9:33 AM MRN: [id number] Endoscopist: Rachael Fee , MD Age: 62 Referring MD:  Date of Birth: September 30, 1955 Gender: Male Account #: [id number] Procedure:                Upper GI endoscopy Indications:              Surveillance for malignancy due to personal history                            of Barrett's esophagus; Barrett's without                            dysplasia, first noted 11/2011 EGD; 02/2013 EGD                            non-nodular irregular Z line with path showing IM                            without dysplasia Medicines:                Monitored Anesthesia Care Procedure:                Pre-Anesthesia Assessment:                           - Prior to the procedure, a History and Physical                            was performed, and patient medications and                            allergies were reviewed. The patient's tolerance of                            previous anesthesia was also reviewed. The risks                            and benefits of the procedure and the sedation                            options and risks were discussed with the patient.                            All questions were answered, and informed consent                            was obtained. Prior Anticoagulants: The patient has                            taken no previous anticoagulant or antiplatelet                            agents. ASA Grade Assessment: II - A patient with  mild systemic disease. After reviewing the risks                            and benefits, the patient was deemed in                            satisfactory condition to undergo the procedure.                           After obtaining informed consent, the endoscope was                            passed under direct vision. Throughout the                            procedure, the patient's blood pressure, pulse, and                             oxygen saturations were monitored continuously. The                            Model GIF-[id number]) scope was introduced                            through the mouth, and advanced to the second part                            of duodenum. The upper GI endoscopy was                            accomplished without difficulty. The patient                            tolerated the procedure well. Scope In: Scope Out: Findings:                 Non-nodular, irregular Z line (very short segment                            Barrett's appearing mucosa). Biopsied                           The exam was otherwise without abnormality. Complications:            No immediate complications. Estimated blood loss:                            None. Estimated Blood Loss:     Estimated blood loss: none. Impression:               - Very short segment Barrett's (non-nodular,                            irregular Z line), biopsied.                           -  The examination was otherwise normal. Recommendation:           - Patient has a contact number available for                            emergencies. The signs and symptoms of potential                            delayed complications were discussed with the                            patient. Return to normal activities tomorrow.                            Written discharge instructions were provided to the                            patient.                           - Resume previous diet.                           - Continue present medications.                           - Await pathology results. Given newer guidelines,                            if the biopsies again show IM without dysplasia                            there is no need for further surveillance. Rachael Fee, MD 03/17/2017 9:48:18 AM This report has been signed electronically.

## 2017-03-17 NOTE — Progress Notes (Signed)
Teeth unchanged after procedure.A and O x3. Report to RN. Tolerated MAC anesthesia well.

## 2017-03-17 NOTE — Patient Instructions (Signed)
YOU HAD AN ENDOSCOPIC PROCEDURE TODAY AT THE  ENDOSCOPY CENTER:   Refer to the procedure report that was given to you for any specific questions about what was found during the examination.  If the procedure report does not answer your questions, please call your gastroenterologist to clarify.  If you requested that your care partner not be given the details of your procedure findings, then the procedure report has been included in a sealed envelope for you to review at your convenience later.  YOU SHOULD EXPECT: Some feelings of bloating in the abdomen. Passage of more gas than usual.  Walking can help get rid of the air that was put into your GI tract during the procedure and reduce the bloating. If you had a lower endoscopy (such as a colonoscopy or flexible sigmoidoscopy) you may notice spotting of blood in your stool or on the toilet paper. If you underwent a bowel prep for your procedure, you may not have a normal bowel movement for a few days.  Please Note:  You might notice some irritation and congestion in your nose or some drainage.  This is from the oxygen used during your procedure.  There is no need for concern and it should clear up in a day or so.  SYMPTOMS TO REPORT IMMEDIATELY:    Following upper endoscopy (EGD)  Vomiting of blood or coffee ground material  New chest pain or pain under the shoulder blades  Painful or persistently difficult swallowing  New shortness of breath  Fever of 100F or higher  Black, tarry-looking stools  For urgent or emergent issues, a gastroenterologist can be reached at any hour by calling [phone].  Please see handouts given to you on Barrett's.    DIET:  We do recommend a small meal at first, but then you may proceed to your regular diet.  Drink plenty of fluids but you should avoid alcoholic beverages for 24 hours.  ACTIVITY:  You should plan to take it easy for the rest of today and you should NOT DRIVE or use heavy machinery  until tomorrow (because of the sedation medicines used during the test).    FOLLOW UP: Our staff will call the number listed on your records the next business day following your procedure to check on you and address any questions or concerns that you may have regarding the information given to you following your procedure. If we do not reach you, we will leave a message.  However, if you are feeling well and you are not experiencing any problems, there is no need to return our call.  We will assume that you have returned to your regular daily activities without incident.  If any biopsies were taken you will be contacted by phone or by letter within the next 1-3 weeks.  Please call us at [phone] if you have not heard about the biopsies in 3 weeks.    SIGNATURES/CONFIDENTIALITY: You and/or your care partner have signed paperwork which will be entered into your electronic medical record.  These signatures attest to the fact that that the information above on your After Visit Summary has been reviewed and is understood.  Full responsibility of the confidentiality of this discharge information lies with you and/or your care-partner.  Thank you for letting us take care of your healthcare needs today.

## 2017-03-17 NOTE — Progress Notes (Signed)
Pt's states no medical or surgical changes since previsit or office visit. 

## 2017-03-20 ENCOUNTER — Telehealth: Payer: Self-pay

## 2017-03-20 NOTE — Telephone Encounter (Signed)
  Follow up Call-  Call back number 03/17/2017  Post procedure Call Back phone  # [phone]  Permission to leave phone message Yes  Some recent data might be hidden     Patient questions:  Do you have a fever, pain , or abdominal swelling? No. Pain Score  0 *  Have you tolerated food without any problems? Yes.    Have you been able to return to your normal activities? Yes.    Do you have any questions about your discharge instructions: Diet   No. Medications  No. Follow up visit  No.  Do you have questions or concerns about your Care? No.  Actions: * If pain score is 4 or above: No action needed, pain <4.

## 2017-04-23 ENCOUNTER — Other Ambulatory Visit: Payer: Self-pay | Admitting: Cardiovascular Disease

## 2017-04-24 ENCOUNTER — Other Ambulatory Visit: Payer: Self-pay

## 2017-04-24 MED ORDER — FENOFIBRATE 145 MG PO TABS: 145.0000 mg | ORAL_TABLET | Freq: Every day | ORAL | 2 refills | Status: DC

## 2017-04-24 NOTE — Telephone Encounter (Signed)
REFILL 

## 2017-05-24 ENCOUNTER — Encounter: Payer: Self-pay | Admitting: Cardiovascular Disease

## 2017-05-24 ENCOUNTER — Ambulatory Visit: Payer: 59 | Admitting: Cardiovascular Disease

## 2017-05-24 VITALS — BP 132/86 | HR 62 | Ht 74.0 in | Wt 258.0 lb

## 2017-05-24 DIAGNOSIS — Z950 Presence of cardiac pacemaker: Secondary | ICD-10-CM | POA: Diagnosis not present

## 2017-05-24 DIAGNOSIS — E669 Obesity, unspecified: Secondary | ICD-10-CM

## 2017-05-24 DIAGNOSIS — E785 Hyperlipidemia, unspecified: Secondary | ICD-10-CM | POA: Diagnosis not present

## 2017-05-24 DIAGNOSIS — R55 Syncope and collapse: Secondary | ICD-10-CM

## 2017-05-24 NOTE — Progress Notes (Signed)
Cardiology Office Note    Date:  05/25/2017   ID:  [name], DOB 07-01-1955, MRN [id number]  PCP:  Babs Sciara, MD  Cardiologist:   Thurmon Fair, MD   Chief Complaint  Patient presents with  . Pacemaker Check    History of Present Illness:  [name] is a 62 y.o. 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    He feels well since his last appointment.  He has not had any new syncope.  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  His only complaints are new joint aches and pains.   The patient specifically denies any chest pain at rest exertion, dyspnea at rest or with exertion, orthopnea, paroxysmal nocturnal dyspnea, syncope, palpitations, focal neurological deficits, intermittent claudication, lower extremity edema, unexplained weight gain, cough, hemoptysis or wheezing.  His pacemaker shows normal device function. He has a Medtronic Revo MRI conditional device implanted in May 2012 with a current battery voltage of 295 V (RRT 2.81 V). Lead parameters are excellent. He only paces the atrium roughly 46% of the time and the ventricle 0.2%.  Activity level is constant at 4 hours a day.  No true arrhythmias seen.  As before the episodes labeled mode switch probably represent sinus tachycardia since they gradually increase and decrease in rate.  Unfortunately, he remains mildly obese with a BMI of 33 and this is reflected in his low HDL cholesterol level.  He  loves potatoes and other starchy foods.  Past Medical History:  Diagnosis Date  . Arthritis   . Barrett's esophagus   . GERD (gastroesophageal reflux disease)   . Hypercholesterolemia    triglycerides elevated  .  Hypotension   . Pacemaker   . Syncope     Past Surgical History:  Procedure Laterality Date  . birthmark removal     right thigh  . BLADDER SURGERY     stretched  . CARDIAC CATHETERIZATION  09/15/2006   Recommendation - empiric anti-reflux therapy  . CARDIOVASCULAR STRESS TEST  04/08/2011   No scintigraphic evidence of inducible myocardial ischemia. No lexiscan EKG changes. Non-diagnostic for ischemia.  . CERVICAL SPINE SURGERY     plates and screws  . COLONOSCOPY    . LOWER EXTREMITY ARTERIAL DOPPLER  10/02/2006   No evidence of thrombus or thrombphlebitis.  [name] PACEMAKER INSERTION  06/08/2010   Medtronic Revo model #RVDR01, serial [id number]  . POLYPECTOMY     hyperplastic  . TONSILLECTOMY    . TRANSTHORACIC ECHOCARDIOGRAM  04/06/2011   EF 55-60%, normal  . UPPER GASTROINTESTINAL ENDOSCOPY    . WISDOM TOOTH EXTRACTION      Current Medications: Outpatient Medications Prior to Visit  Medication Sig Dispense Refill  . aspirin EC 81 MG tablet Take 81 mg by mouth daily.    . chlorpheniramine-pseudoephedrine-acetaminophen (SINE-OFF) 2-30-500 MG per tablet Take 2 tablets by mouth every 4 (four) hours as needed for allergies (or sinus pain).     . Cholecalciferol (VITAMIN D PO) Take 1 tablet by mouth daily.    . fenofibrate (TRICOR) 145 MG tablet Take 1 tablet (145 mg total) by mouth daily. 90 tablet 2  . Multiple Vitamin (MULTIVITAMIN) tablet Take 1 tablet by mouth daily.    . Naphazoline HCl (  CLEAR EYES OP) Place 2 drops into both eyes daily as needed (for dry eyes).    [name] omeprazole (PRILOSEC) 20 MG capsule Take 1 capsule (20 mg total) by mouth daily. 90 capsule 3   Facility-Administered Medications Prior to Visit  Medication Dose Route Frequency Provider Last Rate Last Dose  . 0.9 %  sodium chloride infusion  500 mL Intravenous Once Rachael Fee, MD         Allergies:   Other; Norvasc [amlodipine besylate]; Levofloxacin; and Sulfonamide derivatives   Social History    Socioeconomic History  . Marital status: Married    Spouse name: Not on file  . Number of children: 2  . Years of education: Not on file  . Highest education level: Not on file  Occupational History  . Occupation: Education officer, museum, Estate agent, 3rd shift    Employer: [employer]  Social Needs  . Financial resource strain: Not on file  . Food insecurity:    Worry: Not on file    Inability: Not on file  . Transportation needs:    Medical: Not on file    Non-medical: Not on file  Tobacco Use  . Smoking status: Former Smoker    Types: Cigarettes    Last attempt to quit: 11/27/1973    Years since quitting: 43.5  . Smokeless tobacco: Never Used  Substance and Sexual Activity  . Alcohol use: No  . Drug use: No  . Sexual activity: Not on file  Lifestyle  . Physical activity:    Days per week: Not on file    Minutes per session: Not on file  . Stress: Not on file  Relationships  . Social connections:    Talks on phone: Not on file    Gets together: Not on file    Attends religious service: Not on file    Active member of club or organization: Not on file    Attends meetings of clubs or organizations: Not on file    Relationship status: Not on file  Other Topics Concern  . Not on file  Social History Narrative   Lives with wife, who is a Engineer, civil (consulting).     Family History:  The patient's family history includes COPD in his sister; Esophageal cancer in his maternal uncle; Heart attack in his father and sister; Heart failure in his father; Hyperlipidemia in his brother and sister; Hypertension in his brother; Lymphoma in his brother; Ovarian cancer in his mother.   ROS:   Please see the history of present illness.    ROS All other systems reviewed and are negative.   PHYSICAL EXAM:   VS:  BP 132/86   Pulse 62   Ht 6\' 2"  (1.88 m)   Wt 258 lb (117 kg)   BMI 33.13 kg/m     General: Alert, oriented x3, no distress, Obese; healthy left subclavian pacemaker site Head:  no evidence of trauma, PERRL, EOMI, no exophtalmos or lid lag, no myxedema, no xanthelasma; normal ears, nose and oropharynx Neck: normal jugular venous pulsations and no hepatojugular reflux; brisk carotid pulses without delay and no carotid bruits Chest: clear to auscultation, no signs of consolidation by percussion or palpation, normal fremitus, symmetrical and full respiratory excursions Cardiovascular: normal position and quality of the apical impulse, regular rhythm, normal first and second heart sounds, no murmurs, rubs or gallops Abdomen: no tenderness or distention, no masses by palpation, no abnormal pulsatility or arterial bruits, normal bowel sounds, no hepatosplenomegaly Extremities: no clubbing,  cyanosis or edema; 2+ radial, ulnar and brachial pulses bilaterally; 2+ right femoral, posterior tibial and dorsalis pedis pulses; 2+ left femoral, posterior tibial and dorsalis pedis pulses; no subclavian or femoral bruits Neurological: grossly nonfocal Psych: Normal mood and affect  Wt Readings from Last 3 Encounters:  05/24/17 258 lb (117 kg)  03/17/17 262 lb (118.8 kg)  03/03/17 262 lb 3.2 oz (118.9 kg)      Studies/Labs Reviewed:   EKG:  EKG is ordered today.  The ekg ordered today demonstrates Atrial paced, ventricular sensed otherwise normal  Recent Labs: 12/26/2016: ALT 22; BUN 24; Creat 1.21; Hemoglobin 14.5; Platelets 171; Potassium 4.1; Sodium 142   Lipid Panel    Component Value Date/Time   CHOL 158 12/26/2016 0831   CHOL 170 12/20/2014 0938   TRIG 110 12/26/2016 0831   HDL 31 (L) 12/26/2016 0831   HDL 30 (L) 12/20/2014 0938   CHOLHDL 5.1 (H) 12/26/2016 0831   VLDL 30 12/28/2015 0827   LDLCALC 106 (H) 12/26/2016 0831      ASSESSMENT:    1. Neurocardiogenic syncope   2. Dyslipidemia   3. Mild obesity   4. Pacemaker      PLAN:  In order of problems listed above:  1. Neurocardiogenic syncope: He has not had any events since pacemaker implantation.   Reminded him that he can still have near syncope or event syncope due to depressor events.  He is allowed to have a more liberal sodium content in his diet and should always stay very well hydrated (not by drinking caffeinated such as Dr. Reino KentPepper or coffee, which he prefers).  He should immediately pay attention to any prodromal symptoms to avoid full-blown syncope. 2. HLP: HDL cholesterol is very low.  He needs to lose weight, try to get his waistline down to 34 inches, avoid the intake of high glycemic index foods such as potatoes, sodas and sweets. 3. Obesity: as above; has not made any progress with weight loss. 4. PPM: Normal pacemaker function. His device is MRI conditional.  Remote check every 3 months and office visit yearly.    Medication Adjustments/Labs and Tests Ordered: Current medicines are reviewed at length with the patient today.  Concerns regarding medicines are outlined above.  Medication changes, Labs and Tests ordered today are listed in the Patient Instructions below. Patient Instructions  Dr Royann Shiversroitoru recommends that you continue on your current medications as directed. Please refer to the Current Medication list given to you today.  Remote monitoring is used to monitor your Pacemaker or ICD from home. This monitoring reduces the number of office visits required to check your device to one time per year. It allows us to keep an eye on the functioning of your device to ensure it is working properly. You are scheduled for a device check from home on Wednesday, July 17th, 2019. You may send your transmission at any time that day. If you have a wireless device, the transmission will be sent automatically. After your physician reviews your transmission, you will receive a notification with your next transmission date.  To improve our patient care and to more adequately follow your device, [facility] has decided, as a practice, to start following each patient four times a year with  your home monitor. This means that you may experience a remote appointment that is close to an in-office appointment with your physician. Your insurance will apply at the same rate as other remote monitoring transmissions.  Dr Royann Shiversroitoru recommends that you  schedule a follow-up appointment in 12 months with a pacemaker check. You will receive a reminder letter in the mail two months in advance. If you don't receive a letter, please call our office to schedule the follow-up appointment.  If you need a refill on your cardiac medications before your next appointment, please call your pharmacy.    Signed, Thurmon Fair, MD  05/25/2017 5:44 PM    [facility] [address] [place], [place], [place]  [address] Phone: [phone]; Fax: [phone]

## 2017-05-24 NOTE — Patient Instructions (Addendum)
Dr Croitoru recommends that you continue on your current medications as directed. Please refer to the Current Medication list given to you today.  Remote monitoring is used to monitor your Pacemaker or ICD from home. This monitoring reduces the number of office visits required to check your device to one time per year. It allows us to keep an eye on the functioning of your device to ensure it is working properly. You are scheduled for a device check from home on Wednesday, July 17th, 2019. You may send your transmission at any time that day. If you have a wireless device, the transmission will be sent automatically. After your physician reviews your transmission, you will receive a notification with your next transmission date.  To improve our patient care and to more adequately follow your device, [facility] has decided, as a practice, to start following each patient four times a year with your home monitor. This means that you may experience a remote appointment that is close to an in-office appointment with your physician. Your insurance will apply at the same rate as other remote monitoring transmissions.  Dr Croitoru recommends that you schedule a follow-up appointment in 12 months with a pacemaker check. You will receive a reminder letter in the mail two months in advance. If you don't receive a letter, please call our office to schedule the follow-up appointment.  If you need a refill on your cardiac medications before your next appointment, please call your pharmacy. 

## 2017-05-25 ENCOUNTER — Encounter: Payer: Self-pay | Admitting: Cardiovascular Disease

## 2017-07-13 LAB — CUP PACEART INCLINIC DEVICE CHECK
Battery Voltage: 2.96 V
Brady Statistic AP VS Percent: 46.42 %
Brady Statistic RA Percent Paced: 46.42 %
Date Time Interrogation Session: 20190417130747
Implantable Lead Implant Date: 20120501
Implantable Lead Location: 753859
Implantable Lead Location: 753860
Implantable Pulse Generator Implant Date: 20120501
Lead Channel Impedance Value: 472 Ohm
Lead Channel Sensing Intrinsic Amplitude: 14.897 mV
Lead Channel Setting Pacing Pulse Width: 0.4 ms
Lead Channel Setting Sensing Sensitivity: 0.9 mV
MDC IDC LEAD IMPLANT DT: 20120501
MDC IDC MSMT LEADCHNL RA IMPEDANCE VALUE: 372 Ohm
MDC IDC MSMT LEADCHNL RA SENSING INTR AMPL: 1.348 mV
MDC IDC SET LEADCHNL RA PACING AMPLITUDE: 2 V
MDC IDC SET LEADCHNL RV PACING AMPLITUDE: 2.5 V
MDC IDC STAT BRADY AP VP PERCENT: 0 %
MDC IDC STAT BRADY AS VP PERCENT: 0 %
MDC IDC STAT BRADY AS VS PERCENT: 53.58 %
MDC IDC STAT BRADY RV PERCENT PACED: 0 %

## 2017-08-23 ENCOUNTER — Telehealth: Payer: Self-pay

## 2017-08-23 ENCOUNTER — Encounter: Payer: 59 | Admitting: *Deleted

## 2017-08-23 NOTE — Telephone Encounter (Signed)
LMOVM reminding pt to send remote transmission.   

## 2017-08-24 ENCOUNTER — Encounter: Payer: Self-pay | Admitting: Cardiology

## 2017-09-21 ENCOUNTER — Encounter: Payer: Self-pay | Admitting: Cardiology

## 2018-02-19 ENCOUNTER — Other Ambulatory Visit: Payer: Self-pay | Admitting: Orthopedic Surgery

## 2018-02-19 ENCOUNTER — Other Ambulatory Visit (HOSPITAL_COMMUNITY): Payer: Self-pay | Admitting: Orthopedic Surgery

## 2018-02-19 DIAGNOSIS — M25512 Pain in left shoulder: Secondary | ICD-10-CM

## 2018-03-02 ENCOUNTER — Ambulatory Visit (HOSPITAL_COMMUNITY)
Admission: RE | Admit: 2018-03-02 | Discharge: 2018-03-02 | Disposition: A | Discharge status: Home / Self Care | Payer: 59 | Source: Ambulatory Visit | Attending: Orthopedic Surgery | Admitting: Orthopedic Surgery

## 2018-03-02 DIAGNOSIS — M25512 Pain in left shoulder: Secondary | ICD-10-CM | POA: Diagnosis not present

## 2018-03-09 ENCOUNTER — Other Ambulatory Visit: Payer: Self-pay | Admitting: Gastroenterology

## 2018-04-15 ENCOUNTER — Other Ambulatory Visit: Payer: Self-pay | Admitting: Cardiovascular Disease

## 2018-05-13 ENCOUNTER — Ambulatory Visit (HOSPITAL_COMMUNITY)
Admission: EM | Admit: 2018-05-13 | Discharge: 2018-05-13 | Disposition: A | Discharge status: Home / Self Care | Payer: 59 | Attending: Family Medicine | Admitting: Family Medicine

## 2018-05-13 ENCOUNTER — Encounter (HOSPITAL_COMMUNITY): Payer: Self-pay | Admitting: Emergency Medicine

## 2018-05-13 ENCOUNTER — Other Ambulatory Visit: Payer: Self-pay

## 2018-05-13 DIAGNOSIS — R55 Syncope and collapse: Secondary | ICD-10-CM | POA: Diagnosis not present

## 2018-05-13 DIAGNOSIS — H9202 Otalgia, left ear: Secondary | ICD-10-CM | POA: Diagnosis present

## 2018-05-13 LAB — COMPREHENSIVE METABOLIC PANEL
ALT: 26 U/L (ref 0–44)
AST: 29 U/L (ref 15–41)
Albumin: 4.3 g/dL (ref 3.5–5.0)
Alkaline Phosphatase: 44 U/L (ref 38–126)
Anion gap: 6 (ref 5–15)
BUN: 28 mg/dL — ABNORMAL HIGH (ref 8–23)
CO2: 24 mmol/L (ref 22–32)
Calcium: 10 mg/dL (ref 8.9–10.3)
Chloride: 109 mmol/L (ref 98–111)
Creatinine, Ser: 1.4 mg/dL — ABNORMAL HIGH (ref 0.61–1.24)
GFR calc Af Amer: >60 mL/min (ref >60)
GFR calc non Af Amer: 54 mL/min — ABNORMAL LOW (ref >60)
Glucose, Bld: 105 mg/dL — ABNORMAL HIGH (ref 70–99)
Potassium: 4.1 mmol/L (ref 3.5–5.1)
Sodium: 139 mmol/L (ref 135–145)
Total Bilirubin: 0.7 mg/dL (ref 0.3–1.2)
Total Protein: 6.6 g/dL (ref 6.5–8.1)

## 2018-05-13 LAB — CBC
HCT: 41.7 % (ref 39.0–52.0)
Hemoglobin: 14.1 g/dL (ref 13.0–17.0)
MCH: 31.5 pg (ref 26.0–34.0)
MCHC: 33.8 g/dL (ref 30.0–36.0)
MCV: 93.1 fL (ref 80.0–100.0)
Platelets: 197 10*3/uL (ref 150–400)
RBC: 4.48 MIL/uL (ref 4.22–5.81)
RDW: 12.3 % (ref 11.5–15.5)
WBC: 6.9 10*3/uL (ref 4.0–10.5)
nRBC: 0 % (ref 0.0–0.2)

## 2018-05-13 MED ORDER — CETIRIZINE HCL 10 MG PO TABS: 10.0000 mg | ORAL_TABLET | Freq: Every day | ORAL | 0 refills | Status: DC

## 2018-05-13 MED ORDER — FLUTICASONE PROPIONATE 50 MCG/ACT NA SUSP: 1.0000 | Freq: Every day | NASAL | 2 refills | Status: DC

## 2018-05-13 NOTE — Discharge Instructions (Signed)
Tylenol and/or ibuprofen as needed May start antihistamine to see if symptoms improve, daily nasal spray may also help with ear pain.  I will call you if your basic labs have any concerning findings.  Please call your cardiologist tomorrow to notify of episode at work yesterday as they may want to interrogate your pacemaker.  Please go to the ER if develop chest pain, dizziness, sweating, nausea.  If no improvement or worsening of ear pain, sore throat, fevers please return or follow up with your PCP.

## 2018-05-13 NOTE — ED Provider Notes (Signed)
[facility]    CSN: [id number] Arrival date & time: 05/13/18  1223     History   Chief Complaint Chief Complaint  Patient presents with  . Otalgia    HPI YOSEF KROGH is a 63 y.o. male.   DORAN NESTLE presents with complaints of left posterior ear pain and sensation of left cervical lymphadenopathy. Noticed it approximately 3-4 days ago. States he feels a sharp sensation behind the ear. No decreased hearing. Last night he was at work and got dizzy, hot , nausea, and felt like he was going to pass out therefore he left work. He felt chest tightness. He attributes the tightness to the physical work he was doing with his arms above his head. No current chest pain tightness, dizziness sensations. He has had some congestion and cough which is occasionally productive. No change in hearing. No fever or sore throat. Has a pacemaker and states he does have hypotension, cardiology has told him in the past to increase salt intake to help with his BP. States the episode last did not feel like previous episodes he has experienced. Has had cardiac cath in 2008 with minimal CAD per chart review, stress test WNL in 2013.    ROS per HPI, negative if not otherwise mentioned.      Past Medical History:  Diagnosis Date  . Arthritis   . Barrett's esophagus   . GERD (gastroesophageal reflux disease)   . Hypercholesterolemia    triglycerides elevated  . Hypotension   . Pacemaker   . Syncope     Patient Active Problem List   Diagnosis Date Noted  . Mild obesity 01/05/2016  . Neurocardiogenic syncope 12/16/2012  . OSA (obstructive sleep apnea) - presumptive diagnosis 12/16/2012  . Dyspnea 11/09/2012  . Acute prostatitis 05/04/2012  . Chest pain 04/06/2011  . Pacemaker, MDT 5/12 for SB 04/06/2011  . Dehydration 04/06/2011  . Renal insufficiency, secondary to dehydration? Cr 1.7 on admission 04/06/2011  . Dyslipidemia 04/06/2011  . Family history of coronary artery disease  04/06/2011  . Hx of cardiac cath in 2008 and April 2012 showing no significant CAD 04/06/2011    Past Surgical History:  Procedure Laterality Date  . birthmark removal     right thigh  . BLADDER SURGERY     stretched  . CARDIAC CATHETERIZATION  09/15/2006   Recommendation - empiric anti-reflux therapy  . CARDIOVASCULAR STRESS TEST  04/08/2011   No scintigraphic evidence of inducible myocardial ischemia. No lexiscan EKG changes. Non-diagnostic for ischemia.  . CERVICAL SPINE SURGERY     plates and screws  . COLONOSCOPY    . LOWER EXTREMITY ARTERIAL DOPPLER  10/02/2006   No evidence of thrombus or thrombphlebitis.  [name] PACEMAKER INSERTION  06/08/2010   Medtronic Revo model #RVDR01, serial [id number]  . POLYPECTOMY     hyperplastic  . TONSILLECTOMY    . TRANSTHORACIC ECHOCARDIOGRAM  04/06/2011   EF 55-60%, normal  . UPPER GASTROINTESTINAL ENDOSCOPY    . WISDOM TOOTH EXTRACTION         Home Medications    Prior to Admission medications   Medication Sig Start Date End Date Taking? Authorizing Provider  aspirin EC 81 MG tablet Take 81 mg by mouth daily.    [provider]  cetirizine (ZYRTEC) 10 MG tablet Take 1 tablet (10 mg total) by mouth daily. 05/13/18   Georgetta Haber, NP  chlorpheniramine-pseudoephedrine-acetaminophen (SINE-OFF) 2-30-500 MG per tablet Take 2 tablets by mouth every 4 (four) hours  as needed for allergies (or sinus pain).     [provider]  Cholecalciferol (VITAMIN D PO) Take 1 tablet by mouth daily.    [provider]  fenofibrate (TRICOR) 145 MG tablet Take 1 tablet (145 mg total) by mouth daily. KEEP OV. 04/16/18   Croitoru, Mihai, MD  fluticasone (FLONASE) 50 MCG/ACT nasal spray Place 1 spray into both nostrils daily. 05/13/18   Georgetta Haber, NP  Multiple Vitamin (MULTIVITAMIN) tablet Take 1 tablet by mouth daily.    [provider]  Naphazoline HCl (CLEAR EYES OP) Place 2 drops into both eyes daily as needed (for dry eyes).     [provider]  omeprazole (PRILOSEC) 20 MG capsule TAKE 1 CAPSULE BY MOUTH EVERY DAY 03/09/18   Rachael Fee, MD    Family History Family History  Problem Relation Age of Onset  . Ovarian cancer Mother   . Heart failure Father   . Heart attack Father   . Heart attack Sister   . Lymphoma Brother        x 2  . COPD Sister   . Hypertension Brother   . Hyperlipidemia Brother   . Hyperlipidemia Sister   . Esophageal cancer Maternal Uncle   . Colon cancer Neg Hx   . Stomach cancer Neg Hx   . Colon polyps Neg Hx   . Rectal cancer Neg Hx     Social History Social History   Tobacco Use  . Smoking status: Former Smoker    Types: Cigarettes    Last attempt to quit: 11/27/1973    Years since quitting: 44.4  . Smokeless tobacco: Never Used  Substance Use Topics  . Alcohol use: No  . Drug use: No     Allergies   Other; Norvasc [amlodipine besylate]; Levofloxacin; and Sulfonamide derivatives   Review of Systems Review of Systems   Physical Exam Triage Vital Signs ED Triage Vitals [05/13/18 1302]  Enc Vitals Group     BP (!) 146/88     Pulse Rate 63     Resp 18     Temp 98.1 F (36.7 C)     Temp Source Oral     SpO2 97 %     Weight      Height      Head Circumference      Peak Flow      Pain Score 2     Pain Loc      Pain Edu?      Excl. in GC?    No data found.  Updated Vital Signs BP (!) 146/88 (BP Location: Right Arm)   Pulse 63   Temp 98.1 F (36.7 C) (Oral)   Resp 18   SpO2 97%    Physical Exam Vitals signs reviewed.  Constitutional:      Appearance: He is well-developed.  HENT:     Head: Normocephalic and atraumatic.     Right Ear: Tympanic membrane, ear canal and external ear normal.     Left Ear: Tympanic membrane, ear canal and external ear normal.     Nose: Nose normal.     Right Sinus: No maxillary sinus tenderness or frontal sinus tenderness.     Left Sinus: No maxillary sinus tenderness or frontal sinus tenderness.      Mouth/Throat:     Pharynx: Uvula midline.  Eyes:     Conjunctiva/sclera: Conjunctivae normal.     Pupils: Pupils are equal, round, and reactive to light.  Neck:     Musculoskeletal: Normal range of motion.  Cardiovascular:     Rate and Rhythm: Normal rate and regular rhythm.  Pulmonary:     Effort: Pulmonary effort is normal.     Breath sounds: Normal breath sounds.  Lymphadenopathy:     Cervical: No cervical adenopathy.  Skin:    General: Skin is warm and dry.  Neurological:     Mental Status: He is alert and oriented to person, place, and time.    EKG: paced at 61 . Previous EKG was available for review. No stwave changes as interpreted by me.    UC Treatments / Results  Labs (all labs ordered are listed, but only abnormal results are displayed) Labs Reviewed  CBC  COMPREHENSIVE METABOLIC PANEL    EKG None  Radiology No results found.  Procedures Procedures (including critical care time)  Medications Ordered in UC Medications - No data to display  Initial Impression / Assessment and Plan / UC Course  I have reviewed the triage vital signs and the nursing notes.  Pertinent labs & imaging results that were available during my care of the patient were reviewed by me and considered in my medical decision making (see chart for details).     Patient primarily with complaints of pain behind left ear. Exam without acute findings. 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 No further symptoms. Patient difficult to extract all details from, somewhat unforthcoming. Per cardiology note from 2019 some expectation for occasional near syncope. Basic labs collected and pending. Will notify patient by phone if any concerning results. Strict ER precautions discussed. Encouraged to call cardiology tomorrow as may need pacer interrogation. Will try antihistamines to see if this is helpful with otalgia, tylenol or ibuprofen.  Patient verbalized understanding and agreeable to plan.  Ambulatory out of clinic without difficulty.   Final Clinical Impressions(s) / UC Diagnoses   Final diagnoses:  Otalgia of left ear  Near syncope     Discharge Instructions     Tylenol and/or ibuprofen as needed May start antihistamine to see if symptoms improve, daily nasal spray may also help with ear pain.  I will call you if your basic labs have any concerning findings.  Please call your cardiologist tomorrow to notify of episode at work yesterday as they may want to interrogate your pacemaker.  Please go to the ER if develop chest pain, dizziness, sweating, nausea.  If no improvement or worsening of ear pain, sore throat, fevers please return or follow up with your PCP.     ED Prescriptions    Medication Sig Dispense Auth. Provider   cetirizine (ZYRTEC) 10 MG tablet Take 1 tablet (10 mg total) by mouth daily. 30 tablet Linus Mako B, NP   fluticasone (FLONASE) 50 MCG/ACT nasal spray Place 1 spray into both nostrils daily. 16 g Georgetta Haber, NP     Controlled Substance Prescriptions [place] Controlled Substance Registry consulted? Not Applicable   Georgetta Haber, NP 05/13/18 1422

## 2018-05-13 NOTE — ED Triage Notes (Signed)
The patient presented to the [facility] with a complaint of left ear pain x 2 days.

## 2018-05-14 ENCOUNTER — Telehealth (HOSPITAL_COMMUNITY): Payer: Self-pay | Admitting: Emergency Medicine

## 2018-05-14 NOTE — Telephone Encounter (Signed)
Patient contacted and made aware of all results, all questions answered.   

## 2018-05-14 NOTE — Telephone Encounter (Signed)
Attempted to reach patient. No answer at this time.   

## 2018-05-23 ENCOUNTER — Telehealth: Payer: Self-pay

## 2018-05-23 NOTE — Telephone Encounter (Signed)
Will be calling back to schedule appointment.

## 2018-05-24 ENCOUNTER — Telehealth: Payer: Self-pay

## 2018-05-24 DIAGNOSIS — E785 Hyperlipidemia, unspecified: Secondary | ICD-10-CM

## 2018-05-24 DIAGNOSIS — Z79899 Other long term (current) drug therapy: Secondary | ICD-10-CM

## 2018-05-24 NOTE — Telephone Encounter (Signed)
Labs ordered for upcoming appointment.

## 2018-05-28 ENCOUNTER — Telehealth: Payer: Self-pay | Admitting: Cardiovascular Disease

## 2018-05-28 NOTE — Telephone Encounter (Signed)
Pt needs number that was recently given to him to call in for help and to be walked through having his Pacemaker interrogated.. I advised him that I was unsure of that number and will forward to Dr. Erin Hearing nurse to help him with the right number to call prior to his 06/04/18 appt... pt agreed and will wait for a call back.

## 2018-05-28 NOTE — Telephone Encounter (Signed)
Patient needs phone number to an app for his device.

## 2018-05-30 ENCOUNTER — Encounter: Payer: 59 | Admitting: Cardiovascular Disease

## 2018-05-31 ENCOUNTER — Telehealth: Payer: Self-pay

## 2018-05-31 ENCOUNTER — Telehealth: Payer: Self-pay | Admitting: Cardiovascular Disease

## 2018-05-31 ENCOUNTER — Other Ambulatory Visit: Payer: Self-pay | Admitting: Cardiovascular Disease

## 2018-05-31 LAB — COMPLETE METABOLIC PANEL WITH GFR
AG Ratio: 2.3 (calc) (ref 1.0–2.5)
ALT: 21 U/L (ref 9–46)
AST: 20 U/L (ref 10–35)
Albumin: 4.5 g/dL (ref 3.6–5.1)
Alkaline phosphatase (APISO): 42 U/L (ref 35–144)
BUN/Creatinine Ratio: 17 (calc) (ref 6–22)
BUN: 23 mg/dL (ref 7–25)
CO2: 27 mmol/L (ref 20–32)
Calcium: 9.9 mg/dL (ref 8.6–10.3)
Chloride: 106 mmol/L (ref 98–110)
Creat: 1.33 mg/dL — ABNORMAL HIGH (ref 0.70–1.25)
GFR, Est African American: 66 mL/min/{1.73_m2} (ref >=60)
GFR, Est Non African American: 57 mL/min/{1.73_m2} — ABNORMAL LOW (ref >=60)
Globulin: 2 g/dL (calc) (ref 1.9–3.7)
Glucose, Bld: 101 mg/dL — ABNORMAL HIGH (ref 65–99)
Potassium: 4.3 mmol/L (ref 3.5–5.3)
Sodium: 140 mmol/L (ref 135–146)
Total Bilirubin: 0.7 mg/dL (ref 0.2–1.2)
Total Protein: 6.5 g/dL (ref 6.1–8.1)

## 2018-05-31 LAB — LIPID PANEL
Cholesterol: 154 mg/dL (ref <200)
HDL: 29 mg/dL — ABNORMAL LOW (ref >=40)
LDL Cholesterol (Calc): 101 mg/dL (calc) — ABNORMAL HIGH
Non-HDL Cholesterol (Calc): 125 mg/dL (calc) (ref <130)
Total CHOL/HDL Ratio: 5.3 (calc) — ABNORMAL HIGH (ref <5.0)
Triglycerides: 144 mg/dL (ref <150)

## 2018-05-31 NOTE — Telephone Encounter (Signed)
What app? Webex? Forget webex. Have reached out to [employer] to see why no remote downloads.  Has he tried to do a pacemaker download from home? For now leave on schedule for virtual visit 4/27. If we cannot do a download and need an office check, will put on my May 14 DOD schedule.

## 2018-05-31 NOTE — Telephone Encounter (Signed)
Consent not given patient requests to be seen in office and ok per Dr C. (patient added to Dr C schedule on 06/21/2018 when Dr C is DOD.

## 2018-05-31 NOTE — Telephone Encounter (Signed)
Called patient but he did not answer,  Called patients wife and she stated patient was in the office having lab work. Patients wife stated she called the office with questions about pacer and has not received a call back. I advised her that I would contact Dr C and get back with her. She voiced understanding.

## 2018-05-31 NOTE — Telephone Encounter (Signed)
GOT IT ! DONE!

## 2018-05-31 NOTE — Telephone Encounter (Signed)
May 14 it is

## 2018-05-31 NOTE — Telephone Encounter (Signed)
Dr C please advise. (see previous phone message) I spoke with patients wife and she stated they were having issues downloading the app. She wanted to know if they could just  have the pacer interrgated at the office then leave. Informed patients wife that I would reach out to you and get back with her.

## 2018-05-31 NOTE — Telephone Encounter (Signed)
Orders given to [facility] at [facility] for CMET and Lipid panel... along with our fax number and DX codes.

## 2018-05-31 NOTE — Telephone Encounter (Signed)
Spoke with patient and he states he would rather be seen in the office and wanted to cancel his evisit.

## 2018-05-31 NOTE — Telephone Encounter (Signed)
New Message    Bonita Quin from [employer] in [place] is needing the lab requests to be for them The pt is there for the labs now

## 2018-06-04 ENCOUNTER — Telehealth: Payer: 59 | Admitting: Cardiovascular Disease

## 2018-06-21 ENCOUNTER — Other Ambulatory Visit: Payer: Self-pay

## 2018-06-21 ENCOUNTER — Encounter: Payer: Self-pay | Admitting: Cardiovascular Disease

## 2018-06-21 ENCOUNTER — Ambulatory Visit (INDEPENDENT_AMBULATORY_CARE_PROVIDER_SITE_OTHER): Payer: 59 | Admitting: Cardiovascular Disease

## 2018-06-21 VITALS — BP 124/84 | HR 62 | Ht 74.0 in | Wt 245.8 lb

## 2018-06-21 DIAGNOSIS — R55 Syncope and collapse: Secondary | ICD-10-CM

## 2018-06-21 DIAGNOSIS — Z8249 Family history of ischemic heart disease and other diseases of the circulatory system: Secondary | ICD-10-CM

## 2018-06-21 DIAGNOSIS — Z79899 Other long term (current) drug therapy: Secondary | ICD-10-CM | POA: Diagnosis not present

## 2018-06-21 DIAGNOSIS — E785 Hyperlipidemia, unspecified: Secondary | ICD-10-CM | POA: Diagnosis not present

## 2018-06-21 DIAGNOSIS — Z950 Presence of cardiac pacemaker: Secondary | ICD-10-CM | POA: Diagnosis not present

## 2018-06-21 DIAGNOSIS — Z9889 Other specified postprocedural states: Secondary | ICD-10-CM

## 2018-06-21 LAB — PACEMAKER DEVICE OBSERVATION

## 2018-06-21 NOTE — Patient Instructions (Signed)
Medication Instructions:  Continue same medications If you need a refill on your cardiac medications before your next appointment, please call your pharmacy.   Lab work: Fasting lab cmet and lipid panel in 1 year    Testing/Procedures: None ordered  Follow-Up: At [facility], you and your health needs are our priority.  As part of our continuing mission to provide you with exceptional heart care, we have created designated Provider Care Teams.  These Care Teams include your primary Cardiologist (physician) and Advanced Practice Providers (APPs -  Physician Assistants and Nurse Practitioners) who all work together to provide you with the care you need, when you need it. . Schedule follow up appointment with Dr.Croitoru in 12 months    Call 3 months before to schedule

## 2018-06-21 NOTE — Progress Notes (Signed)
Cardiology Office Note    Date:  06/23/2018   ID:  DIN BOOKWALTER, DOB 1955-11-21, MRN [id number]  PCP:  Babs Sciara, MD  Cardiologist:   Thurmon Fair, MD   No chief complaint on file.   History of Present Illness:  [name] is a 63 y.o. 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    He is done well since his last appointment.  He was unable to perform a remote pacemaker download and were trying to get him a new transmitter.  He has not had episodes of syncope or near syncope.  He denies chest pain or shortness of breath at rest or with activity and continues to perform hard physical labor.  He denies leg edema, claudication, focal neurological events.  In office pacemaker interrogation shows normal device function.  His Medtronic Revo dual-chamber pacemaker was implanted in 2012 and has a battery voltage of 2.92 V (ERI 2.81 V.  He has 53% atrial pacing and never requires ventricular pacing.  His device has recorded some episodes of tachycardia with 1: 1 AV association, consistent with sinus tachycardia.  Activity level remains over 5 hours a day.  His pacemaker shows normal device function. He has a Medtronic Revo MRI conditional device implanted in May 2012 with a current battery voltage of 295 V (RRT 2.81 V). Lead parameters are excellent. He only paces the atrium roughly 46% of the time and the ventricle 0.2%.  Activity level is constant at 4 hours a day.  No true arrhythmias seen.  As before the episodes labeled mode switch probably represent sinus tachycardia since they gradually increase and decrease in rate.  Unfortunately, he remains mildly obese with a BMI of 33 and this is reflected in his low HDL cholesterol level.  He   loves potatoes and other starchy foods.  Past Medical History:  Diagnosis Date  . Arthritis   . Barrett's esophagus   . GERD (gastroesophageal reflux disease)   . Hypercholesterolemia    triglycerides elevated  . Hypotension   . Pacemaker   . Syncope     Past Surgical History:  Procedure Laterality Date  . birthmark removal     right thigh  . BLADDER SURGERY     stretched  . CARDIAC CATHETERIZATION  09/15/2006   Recommendation - empiric anti-reflux therapy  . CARDIOVASCULAR STRESS TEST  04/08/2011   No scintigraphic evidence of inducible myocardial ischemia. No lexiscan EKG changes. Non-diagnostic for ischemia.  . CERVICAL SPINE SURGERY     plates and screws  . COLONOSCOPY    . LOWER EXTREMITY ARTERIAL DOPPLER  10/02/2006   No evidence of thrombus or thrombphlebitis.  [name] PACEMAKER INSERTION  06/08/2010   Medtronic Revo model #RVDR01, serial [id number]  . POLYPECTOMY     hyperplastic  . TONSILLECTOMY    . TRANSTHORACIC ECHOCARDIOGRAM  04/06/2011   EF 55-60%, normal  . UPPER GASTROINTESTINAL ENDOSCOPY    . WISDOM TOOTH EXTRACTION      Current Medications: Outpatient Medications Prior to Visit  Medication Sig Dispense Refill  . Ascorbic Acid (VITAMIN C) 500 MG CAPS Take 1 tablet by mouth daily.    [name] aspirin EC 81 MG tablet Take 81 mg by mouth daily.    . cetirizine (ZYRTEC) 10 MG tablet Take 1 tablet (10 mg total) by  mouth daily. 30 tablet 0  . chlorpheniramine-pseudoephedrine-acetaminophen (SINE-OFF) 2-30-500 MG per tablet Take 2 tablets by mouth every 4 (four) hours as needed for allergies (or sinus pain).     . fenofibrate (TRICOR) 145 MG tablet Take 1 tablet (145 mg total) by mouth daily. KEEP OV. 90 tablet 0  . fluticasone (FLONASE) 50 MCG/ACT nasal spray Place 1 spray into both nostrils daily. 16 g 2  . Multiple Vitamin (MULTIVITAMIN) tablet Take 1 tablet by mouth daily.    . Naphazoline HCl (CLEAR EYES OP) Place 2 drops into both eyes daily as needed (for dry eyes).     [name] omeprazole (PRILOSEC) 20 MG capsule TAKE 1 CAPSULE BY MOUTH EVERY DAY 90 capsule 3  . vitamin A 26834 UNIT capsule Take 10,000 Units by mouth daily.    . Cholecalciferol (VITAMIN D PO) Take 1 tablet by mouth daily.     Facility-Administered Medications Prior to Visit  Medication Dose Route Frequency Provider Last Rate Last Dose  . 0.9 %  sodium chloride infusion  500 mL Intravenous Once Rachael Fee, MD         Allergies:   Other; Norvasc [amlodipine besylate]; Levofloxacin; and Sulfonamide derivatives   Social History   Socioeconomic History  . Marital status: Married    Spouse name: Not on file  . Number of children: 2  . Years of education: Not on file  . Highest education level: Not on file  Occupational History  . Occupation: Education officer, museum, Estate agent, 3rd shift    Employer: [employer]  Social Needs  . Financial resource strain: Not on file  . Food insecurity:    Worry: Not on file    Inability: Not on file  . Transportation needs:    Medical: Not on file    Non-medical: Not on file  Tobacco Use  . Smoking status: Former Smoker    Types: Cigarettes    Last attempt to quit: 11/27/1973    Years since quitting: 44.6  . Smokeless tobacco: Never Used  Substance and Sexual Activity  . Alcohol use: No  . Drug use: No  . Sexual activity: Not on file  Lifestyle  . Physical activity:    Days per week: Not on file    Minutes per session: Not on file  . Stress: Not on file  Relationships  . Social connections:    Talks on phone: Not on file    Gets together: Not on file    Attends religious service: Not on file    Active member of club or organization: Not on file    Attends meetings of clubs or organizations: Not on file    Relationship status: Not on file  Other Topics Concern  . Not on file  Social History Narrative   Lives with wife, who is a Engineer, civil (consulting).     Family History:  The patient's family history includes COPD in his sister; Esophageal  cancer in his maternal uncle; Heart attack in his father and sister; Heart failure in his father; Hyperlipidemia in his brother and sister; Hypertension in his brother; Lymphoma in his brother; Ovarian cancer in his mother.   ROS:   Please see the history of present illness.    ROS all other systems are reviewed and are negative   PHYSICAL EXAM:   VS:  BP 124/84   Pulse 62   Ht 6\' 2"  (1.88 m)   Wt 245 lb 12.8 oz (111.5 kg)   BMI 31.56 kg/m  General: Alert, oriented x3, no distress, Obese; healthy left subclavian pacemaker site Head: no evidence of trauma, PERRL, EOMI, no exophtalmos or lid lag, no myxedema, no xanthelasma; normal ears, nose and oropharynx Neck: normal jugular venous pulsations and no hepatojugular reflux; brisk carotid pulses without delay and no carotid bruits Chest: clear to auscultation, no signs of consolidation by percussion or palpation, normal fremitus, symmetrical and full respiratory excursions Cardiovascular: normal position and quality of the apical impulse, regular rhythm, normal first and second heart sounds, no murmurs, rubs or gallops Abdomen: no tenderness or distention, no masses by palpation, no abnormal pulsatility or arterial bruits, normal bowel sounds, no hepatosplenomegaly Extremities: no clubbing, cyanosis or edema; 2+ radial, ulnar and brachial pulses bilaterally; 2+ right femoral, posterior tibial and dorsalis pedis pulses; 2+ left femoral, posterior tibial and dorsalis pedis pulses; no subclavian or femoral bruits Neurological: grossly nonfocal Psych: Normal mood and affect   Wt Readings from Last 3 Encounters:  06/21/18 245 lb 12.8 oz (111.5 kg)  05/24/17 258 lb (117 kg)  03/17/17 262 lb (118.8 kg)      Studies/Labs Reviewed:   EKG:  EKG is ordered today.  It shows atrial paced, ventricular sensed rhythm and is otherwise normal, QTC 410 ms  Recent Labs: 05/13/2018: Hemoglobin 14.1; Platelets 197 05/31/2018: ALT 21; BUN 23; Creat  1.33; Potassium 4.3; Sodium 140   Lipid Panel    Component Value Date/Time   CHOL 154 05/31/2018 0944   CHOL 170 12/20/2014 0938   TRIG 144 05/31/2018 0944   HDL 29 (L) 05/31/2018 0944   HDL 30 (L) 12/20/2014 0938   CHOLHDL 5.3 (H) 05/31/2018 0944   VLDL 30 12/28/2015 0827   LDLCALC 101 (H) 05/31/2018 0944      ASSESSMENT:    1. Neurocardiogenic syncope   2. Dyslipidemia   3. Medication management   4. Pacemaker, MDT 5/12 for SB   5. Family history of coronary artery disease   6. Hx of cardiac cath in 2008 and April 2012 showing no significant CAD      PLAN:  In order of problems listed above:  1. Neurocardiogenic syncope: No full-blown events since pacemaker implantation although he has had some episodes of near syncope consistent with a vasodepressor event.  Even those have recently been better.  Reminded him that he can have a relatively liberal salt intake and needs to stay very well-hydrated.. 2. HLP: Overall his lipid profile is not bad, but his HDL cholesterol remains really low consistent with a metabolic syndrome.  Encourage weight loss.  He is pretty active physically and the problem is with his diet. 3. Obesity: In the mildly obese range.  We discussed the importance of caloric restriction and reviewed the concept of the low glycemic index. 4. PPM: Normal pacemaker function. His device is MRI conditional.  Remote check every 3 months and office visit yearly.    Medication Adjustments/Labs and Tests Ordered: Current medicines are reviewed at length with the patient today.  Concerns regarding medicines are outlined above.  Medication changes, Labs and Tests ordered today are listed in the Patient Instructions below. Patient Instructions  Medication Instructions:  Continue same medications If you need a refill on your cardiac medications before your next appointment, please call your pharmacy.   Lab work: Fasting lab cmet and lipid panel in 1 year     Testing/Procedures: None ordered  Follow-Up: At [facility], you and your health needs are our priority.  As part of our continuing mission  to provide you with exceptional heart care, we have created designated Provider Care Teams.  These Care Teams include your primary Cardiologist (physician) and Advanced Practice Providers (APPs -  Physician Assistants and Nurse Practitioners) who all work together to provide you with the care you need, when you need it. . Schedule follow up appointment with Dr.Johneric Mcfadden in 12 months    Call 3 months before to schedule     Signed, Thurmon Fair, MD  06/23/2018 5:56 PM    [facility] [address] [place], [place], [place]  [address] Phone: [phone]; Fax: [phone]

## 2018-07-12 ENCOUNTER — Other Ambulatory Visit: Payer: Self-pay | Admitting: Cardiovascular Disease

## 2018-11-23 ENCOUNTER — Telehealth: Payer: Self-pay | Admitting: Cardiovascular Disease

## 2018-11-23 MED ORDER — LOSARTAN POTASSIUM 25 MG PO TABS: 25.0000 mg | ORAL_TABLET | Freq: Every day | ORAL | 1 refills | Status: DC

## 2018-11-23 NOTE — Telephone Encounter (Signed)
Patient is not taking a lot of NSAIDS (hardly ever) will send in for Losartan 25 mg.  Patient asks too about the note- advised I would ask and if okay primary would write. Patient verbalized understanding

## 2018-11-23 NOTE — Telephone Encounter (Signed)
New Message  Pt c/o BP issue: STAT if pt c/o blurred vision, one-sided weakness or slurred speech  1. What are your last 5 BP readings? 150/89, 153/91, 151/91, 131/84, 149/85, 157/95  2. Are you having any other symptoms (ex. Dizziness, headache, blurred vision, passed out)? Dizziness, bp will drop really low with activity and he almost passes out,   3. What is your BP issue? BP is elevated.    Patient was out of work Sunday (Oct 11) and Monday (Oct 12) due to his blood pressure and needs a note if possible.

## 2018-11-23 NOTE — Telephone Encounter (Signed)
Called and LVM advising patient of message from MD. Patient advised that primary nurse would notify him once letter was ready pickup. Left call back number if questions.

## 2018-11-23 NOTE — Telephone Encounter (Signed)
I can write note, but it will be Monday. Please also avoid decongestants with pseudoephedrine ( I saw one listed amongst his meds)

## 2018-11-23 NOTE — Telephone Encounter (Signed)
Please start losartan 25 mg daily. Please make sure he is not taking a lot of NSAIDs (ibuprofen , naproxen OTC or Rx).

## 2018-11-23 NOTE — Telephone Encounter (Signed)
Patient states his blood pressure has been up- when normally he has issues with it being low.  Patient has not had any increased salt like normal because of the blood pressure. He states he has felt bad for the past few weeks, but started keeping track on it Monday, and blood pressures are listed below.  Patient mentions dizziness a few times, and having a headache, patient states he just 'feels bad'.  Patient denies chest pain, SOB, swelling in hands/feet.  Patient states no changes in any medications, or diet changes.   Advised patient that I would send a message over to MD to advise of any changes. Patient verbalized understanding.

## 2018-11-26 ENCOUNTER — Encounter: Payer: Self-pay | Admitting: Cardiovascular Disease

## 2018-11-26 NOTE — Telephone Encounter (Signed)
Letter has been received by the wife.

## 2019-01-14 ENCOUNTER — Telehealth: Payer: Self-pay | Admitting: Cardiovascular Disease

## 2019-01-14 NOTE — Telephone Encounter (Signed)
New Message  STAT if patient feels like he/she is going to faint   1) Are you dizzy now? No   2) Do you feel faint or have you passed out? Yes, when he is dizzy, usually with exertion.  3) Do you have any other symptoms? Elevated BP, discomfort in chest, dizziness with exertion, tired, fatigue  4) Have you checked your HR and BP (record if available)? 159/95 hr 70, 139/88 hr 64  Patient states that he was sent home from work and can not return until he is seen by a doctor or PA. Scheduled appt with Almyra Deforest on 01/18/19 1:30pm

## 2019-01-14 NOTE — Telephone Encounter (Signed)
Spoke with pt and since Losartan 25 mg was started back  in mid October pt has not seen a change in B/P Per pt had a few good readings but still seems higher see values below along with pt notes chest discomfort ,dizziness and fatigue Appt moved up to 01/16/19 with Jory Sims NP .Encouraged pt to seek medical care if S/S worsen prior to appt Will forward to Dr Sallyanne Kuster for review and recommendations./cy

## 2019-01-15 NOTE — Progress Notes (Signed)
Cardiology Office Note   Date:  01/16/2019   ID:  [name], DOB 1955/06/23, MRN [id number]  PCP:  Babs Sciara, MD  Cardiologist:  Dr.Croitoru  CC: BP    History of Present Illness: [name] is a 63 y.o. 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    Other history includes hypertension, hypercholesterolemia, low HDL.  Pacemaker was last checked on 06/21/2018 in the office.  Dr. Royann Shivers  noted that his device recorded that his activity level was constant for hours a day with no true arrhythmias seen.  He did have some mode switches which were labeled as sinus tachycardia since a gradually increase and decrease in rate.The patient was given salt liberalization to prevent vasodepressor syncope.  [name] called our office on 11/23/2018 complaining of blurred vision, with elevation of blood pressure ranging between 157/95-131/84.  He was advised to start losartan 25 mg daily and advised not to take any NSAIDs, along with decongestants with pseudoephedrine.  The patient again called our office on 01/15/2019 complaining of near syncope.  Blood pressure was 159/95 with a heart rate of 70 per patient's report.  He also reported a blood pressure of 139/88 with a heart rate of 64.  He complained of fatigue and dizziness.  He was advised to be seen in the office for further evaluation.  He will likely need orthostatic blood pressures completed today.  [name] was not found to be orthostatic, but his blood pressures were found to be low at 116/79 two 113/73 heart rate is between 69 and 59.  He is brought with him his blood pressure recording sheets.  He has blood pressures in the 150s to 148 range on some days and in the 120s to 130s on other days.  After talking with him he  works nights 3 days a week, and sometimes an occasional 8 hours for mandatory overtime.  On those days it is noted that his blood pressure is higher.  On his days off when he is able to rest and sleep his blood pressure is much lower.   Past Medical History:  Diagnosis Date  . Arthritis   . Barrett's esophagus   . GERD (gastroesophageal reflux disease)   . Hypercholesterolemia    triglycerides elevated  . Hypotension   . Pacemaker   . Syncope     Past Surgical History:  Procedure Laterality Date  . birthmark removal     right thigh  . BLADDER SURGERY     stretched  . CARDIAC CATHETERIZATION  09/15/2006   Recommendation - empiric anti-reflux therapy  . CARDIOVASCULAR STRESS TEST  04/08/2011   No scintigraphic evidence of inducible myocardial ischemia. No lexiscan EKG changes. Non-diagnostic for ischemia.  . CERVICAL SPINE SURGERY     plates and screws  . COLONOSCOPY    . LOWER EXTREMITY ARTERIAL DOPPLER  10/02/2006   No evidence of thrombus or thrombphlebitis.  [name] PACEMAKER INSERTION  06/08/2010   Medtronic Revo model #RVDR01, serial [id number]  . POLYPECTOMY     hyperplastic  . TONSILLECTOMY    . TRANSTHORACIC ECHOCARDIOGRAM  04/06/2011   EF 55-60%, normal  . UPPER GASTROINTESTINAL ENDOSCOPY    . WISDOM TOOTH EXTRACTION       Current Outpatient Medications  Medication Sig Dispense Refill  . aspirin EC 81 MG tablet  Take 81 mg by mouth daily.    . cetirizine (ZYRTEC) 10 MG tablet Take 1 tablet (10 mg total) by mouth daily. 30 tablet 0  . fenofibrate (TRICOR) 145 MG tablet TAKE 1 TABLET (145 MG TOTAL) BY MOUTH DAILY. KEEP OV. 90 tablet 3  . losartan (COZAAR) 25 MG tablet Take 1 tablet (25 mg total) by mouth daily. 90 tablet 1  . Multiple Vitamin (MULTIVITAMIN) tablet Take 1 tablet by mouth daily.    . Naphazoline HCl (CLEAR EYES OP) Place 2 drops into both eyes daily as needed (for dry eyes).    [name] omeprazole (PRILOSEC) 20 MG capsule TAKE 1 CAPSULE BY MOUTH EVERY DAY 90 capsule  3   Current Facility-Administered Medications  Medication Dose Route Frequency Provider Last Rate Last Dose  . 0.9 %  sodium chloride infusion  500 mL Intravenous Once Milus Banister, MD        Allergies:   Other, Norvasc [amlodipine besylate], Levofloxacin, and Sulfonamide derivatives    Social History:  The patient  reports that he quit smoking about 45 years ago. His smoking use included cigarettes. He has never used smokeless tobacco. He reports that he does not drink alcohol or use drugs.   Family History:  The patient's family history includes COPD in his sister; Esophageal cancer in his maternal uncle; Heart attack in his father and sister; Heart failure in his father; Hyperlipidemia in his brother and sister; Hypertension in his brother; Lymphoma in his brother; Ovarian cancer in his mother.    ROS: All other systems are reviewed and negative. Unless otherwise mentioned in H&P    PHYSICAL EXAM: VS:  BP (!) 148/89   Pulse 63   Ht 6\' 2"  (1.88 m)   Wt 244 lb 6.4 oz (110.9 kg)   BMI 31.38 kg/m  , BMI Body mass index is 31.38 kg/m. GEN: Well nourished, well developed, in no acute distress HEENT: normal Neck: no carotid bruits, or masses Cardiac: RRR; no murmurs, rubs, or gallops,no edema  Respiratory:  Clear to auscultation bilaterally, normal work of breathing GI: soft, nontender, nondistended, + BS MS: no deformity or atrophy Skin: warm and dry, no rash Neuro:  Strength and sensation are intact Psych: euthymic mood, full affect   EKG: Atrial paced rhythm, heart rate of 63 bpm.   Recent Labs: 05/13/2018: Hemoglobin 14.1; Platelets 197 05/31/2018: ALT 21; BUN 23; Creat 1.33; Potassium 4.3; Sodium 140    Lipid Panel    Component Value Date/Time   CHOL 154 05/31/2018 0944   CHOL 170 12/20/2014 0938   TRIG 144 05/31/2018 0944   HDL 29 (L) 05/31/2018 0944   HDL 30 (L) 12/20/2014 0938   CHOLHDL 5.3 (H) 05/31/2018 0944   VLDL 30 12/28/2015 0827   LDLCALC 101 (H)  05/31/2018 0944      Wt Readings from Last 3 Encounters:  01/16/19 244 lb 6.4 oz (110.9 kg)  06/21/18 245 lb 12.8 oz (111.5 kg)  05/24/17 258 lb (117 kg)     ASSESSMENT AND PLAN:  1.  Neurocardiogenic hypotension: His blood pressures are higher on the days that he works and much lower on the days that he is off.  I am going to try having him take full dose losartan 25 mg on his days of work, and 12.5 mg of losartan on the days that he is off so that we can keep more of an even blood pressure range.  The patient is having some dizziness at times with  the lower blood pressures.  His wife is a Engineer, civil (consulting)nurse, and he does take his blood pressure accurately.  I will check a BMET, and a TSH today.  2.  Hyperlipidemia: Checking fasting lipids and LFTs today.  He has had a couple coffee and therefore triglycerides may be a little elevated.  3.  OSA: Not currently on CPAP.   Current medicines are reviewed at length with the patient today.    Labs/ tests ordered today include: BMET, TSH,  lipids and LFTs. Bettey MareKathryn M. Liborio NixonLawrence DNP, ANP, AACC   01/16/2019 2:22 PM    [facility] [address] Office [phone] Fax [phone]  Notice: This dictation was prepared with Dragon dictation along with smaller phrase technology. Any transcriptional errors that result from this process are unintentional and may not be corrected upon review.

## 2019-01-16 ENCOUNTER — Other Ambulatory Visit: Payer: Self-pay

## 2019-01-16 ENCOUNTER — Ambulatory Visit: Payer: 59 | Admitting: Adult Health

## 2019-01-16 ENCOUNTER — Encounter: Payer: Self-pay | Admitting: Adult Health

## 2019-01-16 VITALS — BP 148/89 | HR 63 | Ht 74.0 in | Wt 244.4 lb

## 2019-01-16 DIAGNOSIS — R42 Dizziness and giddiness: Secondary | ICD-10-CM | POA: Diagnosis not present

## 2019-01-16 DIAGNOSIS — Z79899 Other long term (current) drug therapy: Secondary | ICD-10-CM | POA: Diagnosis not present

## 2019-01-16 DIAGNOSIS — E785 Hyperlipidemia, unspecified: Secondary | ICD-10-CM | POA: Diagnosis not present

## 2019-01-16 DIAGNOSIS — R55 Syncope and collapse: Secondary | ICD-10-CM | POA: Diagnosis not present

## 2019-01-16 NOTE — Patient Instructions (Addendum)
Medication Instructions:  Continue current medications  If you need a refill on your cardiac medications before your next appointment, please call your pharmacy.  Labwork: TSH, BMP, Lipids and Liver HERE IN OUR OFFICE AT [facility]   You will need to fast. DO NOT EAT OR DRINK PAST MIDNIGHT.       If you have labs (blood work) drawn today and your tests are completely normal, you will receive your results only by: [name] MyChart Message (if you have MyChart) OR . A paper copy in the mail If you have any lab test that is abnormal or we need to change your treatment, we will call you to review the results.  Testing/Procedures: None Ordered  Follow-Up: Wednesday January 20th @ 1:45 pm with Jory Sims, DNP  At [facility], you and your health needs are our priority.  As part of our continuing mission to provide you with exceptional heart care, we have created designated Provider Care Teams.  These Care Teams include your primary Cardiologist (physician) and Advanced Practice Providers (APPs -  Physician Assistants and Nurse Practitioners) who all work together to provide you with the care you need, when you need it.  Thank you for choosing [facility] at [facility]!!       Happy Holidays!!

## 2019-01-17 LAB — TSH: TSH: 1.79 u[IU]/mL (ref 0.450–4.500)

## 2019-01-17 LAB — LIPID PANEL
Chol/HDL Ratio: 5.8 ratio — ABNORMAL HIGH (ref 0.0–5.0)
Cholesterol, Total: 173 mg/dL (ref 100–199)
HDL: 30 mg/dL — ABNORMAL LOW (ref >39)
LDL Chol Calc (NIH): 115 mg/dL — ABNORMAL HIGH (ref 0–99)
Triglycerides: 156 mg/dL — ABNORMAL HIGH (ref 0–149)
VLDL Cholesterol Cal: 28 mg/dL (ref 5–40)

## 2019-01-17 LAB — BASIC METABOLIC PANEL
BUN/Creatinine Ratio: 19 (ref 10–24)
BUN: 23 mg/dL (ref 8–27)
CO2: 21 mmol/L (ref 20–29)
Calcium: 9.9 mg/dL (ref 8.6–10.2)
Chloride: 103 mmol/L (ref 96–106)
Creatinine, Ser: 1.2 mg/dL (ref 0.76–1.27)
GFR calc Af Amer: 75 mL/min/{1.73_m2} (ref >59)
GFR calc non Af Amer: 65 mL/min/{1.73_m2} (ref >59)
Glucose: 112 mg/dL — ABNORMAL HIGH (ref 65–99)
Potassium: 4.2 mmol/L (ref 3.5–5.2)
Sodium: 140 mmol/L (ref 134–144)

## 2019-01-17 LAB — HEPATIC FUNCTION PANEL
ALT: 21 IU/L (ref 0–44)
AST: 25 IU/L (ref 0–40)
Albumin: 4.6 g/dL (ref 3.8–4.8)
Alkaline Phosphatase: 54 IU/L (ref 39–117)
Bilirubin Total: 0.4 mg/dL (ref 0.0–1.2)
Bilirubin, Direct: 0.15 mg/dL (ref 0.00–0.40)
Total Protein: 6.9 g/dL (ref 6.0–8.5)

## 2019-01-18 ENCOUNTER — Ambulatory Visit: Payer: 59 | Admitting: Physician Assistant

## 2019-02-18 ENCOUNTER — Ambulatory Visit: Payer: 59 | Admitting: Adult Health

## 2019-02-25 NOTE — Progress Notes (Signed)
Cardiology Office Note   Date:  02/27/2019   ID:  KOBEN DAMAN, DOB April 12, 1955, MRN [id number]  PCP:  Babs Sciara, MD  Cardiologist:   No chief complaint on file.    History of Present Illness: [name] is a 64 y.o. 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    Other history includes hypertension, hypercholesterolemia, low HDL.  Pacemaker was last checked on 06/21/2018 in the office.  Dr. Royann Shivers  noted that his device recorded that his activity level was constant for hours a day with no true arrhythmias seen.  He did have some mode switches which were labeled as sinus tachycardia since a gradually increase and decrease in rate.The patient was given salt liberalization to prevent vasodepressor syncope.  On last office visit I reviewed his BP log and noticed that his BP was running higher on the days that he was at work and much lower on his off days.  I had him take full dose losartan 25 mg on his work days and lower dose (usual dose) of 12.5 mg on his off days He was to keep a BP log.   I also checked fasting lipids and LFT's, BMET and TSH. LDL was found to be 115, TC-173, LFT's were normal. Chemistries were normal.   [name] comes today feeling very well. He denies any complaints of chest pain, dyspnea on exertion, or dizziness. He has been taking his blood pressure at home with his machine and has found it to be much higher than what we are seeing here in the office. We did calibrate his blood pressure machine and it is running 30 points higher than what we are reading here in the office.  Patient also has a Medtronic pacemaker, which has not been interrogated since May 2020 at last office visit. He does have a remote connector at home but is not actually set  this up with our pacemaker clinic.    Past Medical History:  Diagnosis Date  . Arthritis   . Barrett's esophagus   . GERD (gastroesophageal reflux disease)   . Hypercholesterolemia    triglycerides elevated  . Hypotension   . Pacemaker   . Syncope     Past Surgical History:  Procedure Laterality Date  . birthmark removal     right thigh  . BLADDER SURGERY     stretched  . CARDIAC CATHETERIZATION  09/15/2006   Recommendation - empiric anti-reflux therapy  . CARDIOVASCULAR STRESS TEST  04/08/2011   No scintigraphic evidence of inducible myocardial ischemia. No lexiscan EKG changes. Non-diagnostic for ischemia.  . CERVICAL SPINE SURGERY     plates and screws  . COLONOSCOPY    . LOWER EXTREMITY ARTERIAL DOPPLER  10/02/2006   No evidence of thrombus or thrombphlebitis.  [name] PACEMAKER INSERTION  06/08/2010   Medtronic Revo model #RVDR01, serial [id number]  . POLYPECTOMY     hyperplastic  . TONSILLECTOMY    . TRANSTHORACIC ECHOCARDIOGRAM  04/06/2011   EF 55-60%, normal  . UPPER GASTROINTESTINAL ENDOSCOPY    . WISDOM TOOTH EXTRACTION       Current Outpatient Medications  Medication Sig Dispense Refill  . aspirin EC 81 MG tablet Take 81 mg by mouth daily.    . fenofibrate (TRICOR) 145 MG tablet TAKE 1 TABLET (145 MG TOTAL) BY MOUTH DAILY. KEEP  OV. 90 tablet 3  . losartan (COZAAR) 25 MG tablet Take 1 tablet (25 mg total) by mouth daily. 90 tablet 1  . Multiple Vitamin (MULTIVITAMIN) tablet Take 1 tablet by mouth daily.    . Naphazoline HCl (CLEAR EYES OP) Place 2 drops into both eyes daily as needed (for dry eyes).    [name] omeprazole (PRILOSEC) 20 MG capsule TAKE 1 CAPSULE BY MOUTH EVERY DAY 90 capsule 3   Current Facility-Administered Medications  Medication Dose Route Frequency Provider Last Rate Last Admin  . 0.9 %  sodium chloride infusion  500 mL Intravenous Once Milus Banister, MD        Allergies:   Other, Norvasc [amlodipine besylate], Levofloxacin, and Sulfonamide  derivatives    Social History:  The patient  reports that he quit smoking about 45 years ago. His smoking use included cigarettes. He has never used smokeless tobacco. He reports that he does not drink alcohol or use drugs.   Family History:  The patient's family history includes COPD in his sister; Esophageal cancer in his maternal uncle; Heart attack in his father and sister; Heart failure in his father; Hyperlipidemia in his brother and sister; Hypertension in his brother; Lymphoma in his brother; Ovarian cancer in his mother.    ROS: All other systems are reviewed and negative. Unless otherwise mentioned in H&P    PHYSICAL EXAM: VS:  BP 115/76   Pulse 64   Temp 97.9 F (36.6 C)   Ht 6' (1.829 m)   Wt 249 lb (112.9 kg)   BMI 33.77 kg/m  , BMI Body mass index is 33.77 kg/m. GEN: Well nourished, well developed, in no acute distress HEENT: normal Neck: no JVD, carotid bruits, or masses Cardiac:RRR; no murmurs, rubs, or gallops,no edema  Respiratory:  Clear to auscultation bilaterally, normal work of breathing GI: soft, nontender, nondistended, + BS MS: no deformity or atrophy Skin: warm and dry, no rash Neuro:  Strength and sensation are intact Psych: euthymic mood, full affect   EKG:  EKG  not ordered today.    Recent Labs: 05/13/2018: Hemoglobin 14.1; Platelets 197 01/16/2019: ALT 21; BUN 23; Creatinine, Ser 1.20; Potassium 4.2; Sodium 140; TSH 1.790    Lipid Panel    Component Value Date/Time   CHOL 173 01/16/2019 1408   TRIG 156 (H) 01/16/2019 1408   HDL 30 (L) 01/16/2019 1408   CHOLHDL 5.8 (H) 01/16/2019 1408   CHOLHDL 5.3 (H) 05/31/2018 0944   VLDL 30 12/28/2015 0827   LDLCALC 115 (H) 01/16/2019 1408   LDLCALC 101 (H) 05/31/2018 0944      Wt Readings from Last 3 Encounters:  02/27/19 249 lb (112.9 kg)  01/16/19 244 lb 6.4 oz (110.9 kg)  06/21/18 245 lb 12.8 oz (111.5 kg)      Other studies Reviewed: Echocardiogram Nov 15, 2012 Left ventricle: The  cavity size was normal. There was mild  concentric hypertrophy. Systolic function was normal. The  estimated ejection fraction was in the range of 55% to  60%. Wall motion was normal; there were no regional wall  motion abnormalities. Doppler parameters are consistent  with abnormal left ventricular relaxation (grade 1  diastolic dysfunction). The E/e' ratio is <10, suggesting  normal LV filling pressure.  - Left atrium: LA Volume/ BSA = 24.4 ml/m2 The atrium was  normal in size.  - Right ventricle: The cavity size was normal. Wall  thickness was normal. Pacer wire or catheter noted in  right ventricle. Systolic function was normal.  -  Right atrium: The atrium was mildly dilated. Pacer wire or  catheter noted in right atrium.  - Atrial septum: No defect or patent foramen ovale was  identified.  - Inferior vena cava: The vessel was normal in size; the  respirophasic diameter changes were in the normal range (=  50%); findings are consistent with normal central venous  pressure.    ASSESSMENT AND PLAN:  1. Hypertension: Blood pressure is well controlled today. He requested that I check his blood pressure in both arms. Right arm 116/68, left arm 118/70. I will continue his current medication regimen at this time. I have reviewed all of his lab work with him in detail. Answered several questions.  2. Medtronic pacemaker in situ: He has been given phone number to our pacemaker clinic. He will call them and have them walking through setting up his remote pacemaker interrogator at home. He will follow-up with Dr. Royann Shivers in May 2021 for in person pacemaker interrogation.  3. Mixed hyperlipidemia: He is currently on fenofibrate TriCor. He is advised to increase his exercise activity, reduce cholesterol in his diet, and lose weight to assist with better control. I have advised 150 min of exercise a week. I have asked him to start slow and work his way up. He  verbalizes understanding.    Current medicines are reviewed at length with the patient today.    Labs/ tests ordered today include:  None Bettey Mare. Liborio Nixon, ANP, AACC   02/27/2019 1:16 PM    [facility] [address] Office [phone] Fax [phone]  Notice: This dictation was prepared with Dragon dictation along with smaller phrase technology. Any transcriptional errors that result from this process are unintentional and may not be corrected upon review.

## 2019-02-27 ENCOUNTER — Encounter (INDEPENDENT_AMBULATORY_CARE_PROVIDER_SITE_OTHER): Payer: Self-pay

## 2019-02-27 ENCOUNTER — Encounter: Payer: Self-pay | Admitting: Adult Health

## 2019-02-27 ENCOUNTER — Ambulatory Visit (INDEPENDENT_AMBULATORY_CARE_PROVIDER_SITE_OTHER): Payer: BC Managed Care – PPO | Admitting: Adult Health

## 2019-02-27 ENCOUNTER — Other Ambulatory Visit: Payer: Self-pay

## 2019-02-27 VITALS — BP 115/76 | HR 64 | Temp 97.9°F | Ht 72.0 in | Wt 249.0 lb

## 2019-02-27 DIAGNOSIS — Z950 Presence of cardiac pacemaker: Secondary | ICD-10-CM

## 2019-02-27 DIAGNOSIS — I10 Essential (primary) hypertension: Secondary | ICD-10-CM

## 2019-02-27 DIAGNOSIS — E785 Hyperlipidemia, unspecified: Secondary | ICD-10-CM | POA: Diagnosis not present

## 2019-02-27 MED ORDER — LOSARTAN POTASSIUM 25 MG PO TABS: 25.0000 mg | ORAL_TABLET | Freq: Every day | ORAL | 3 refills | Status: DC

## 2019-02-27 NOTE — Patient Instructions (Signed)
Medication Instructions:  Continue current medications  *If you need a refill on your cardiac medications before your next appointment, please call your pharmacy*  Lab Work: None Ordered  Testing/Procedures: None Ordered  Follow-Up: At [facility], you and your health needs are our priority.  As part of our continuing mission to provide you with exceptional heart care, we have created designated Provider Care Teams.  These Care Teams include your primary Cardiologist (physician) and Advanced Practice Providers (APPs -  Physician Assistants and Nurse Practitioners) who all work together to provide you with the care you need, when you need it.  Your next appointment:   6 month(s)  The format for your next appointment:   In Person  Provider:   Thurmon Fair, MD  Other Instructions [facility] (ph) [phone]

## 2019-04-04 ENCOUNTER — Ambulatory Visit (INDEPENDENT_AMBULATORY_CARE_PROVIDER_SITE_OTHER): Payer: BC Managed Care – PPO | Admitting: *Deleted

## 2019-04-04 DIAGNOSIS — Z950 Presence of cardiac pacemaker: Secondary | ICD-10-CM

## 2019-04-04 LAB — CUP PACEART REMOTE DEVICE CHECK
Battery Voltage: 2.9 V
Brady Statistic AP VP Percent: 0 %
Brady Statistic AP VS Percent: 56.66 %
Brady Statistic AS VP Percent: 0 %
Brady Statistic AS VS Percent: 43.34 %
Brady Statistic RA Percent Paced: 56.62 %
Brady Statistic RV Percent Paced: 0 %
Date Time Interrogation Session: 20210225144539
Implantable Lead Implant Date: 20120501
Implantable Lead Implant Date: 20120501
Implantable Lead Location: 753859
Implantable Lead Location: 753860
Implantable Pulse Generator Implant Date: 20120501
Lead Channel Impedance Value: 480 Ohm
Lead Channel Impedance Value: 512 Ohm
Lead Channel Sensing Intrinsic Amplitude: 1.869 mV
Lead Channel Sensing Intrinsic Amplitude: 14.558 mV
Lead Channel Setting Pacing Amplitude: 2 V
Lead Channel Setting Pacing Amplitude: 2.5 V
Lead Channel Setting Pacing Pulse Width: 0.4 ms
Lead Channel Setting Sensing Sensitivity: 0.9 mV

## 2019-04-05 NOTE — Progress Notes (Signed)
PPM Remote  

## 2019-07-10 ENCOUNTER — Ambulatory Visit (INDEPENDENT_AMBULATORY_CARE_PROVIDER_SITE_OTHER): Payer: BC Managed Care – PPO | Admitting: *Deleted

## 2019-07-10 DIAGNOSIS — R001 Bradycardia, unspecified: Secondary | ICD-10-CM | POA: Diagnosis not present

## 2019-07-10 LAB — CUP PACEART REMOTE DEVICE CHECK
Battery Voltage: 2.89 V
Brady Statistic AP VP Percent: 0 %
Brady Statistic AP VS Percent: 52.56 %
Brady Statistic AS VP Percent: 0 %
Brady Statistic AS VS Percent: 47.44 %
Brady Statistic RA Percent Paced: 52.54 %
Brady Statistic RV Percent Paced: 0 %
Date Time Interrogation Session: 20210602152335
Implantable Lead Implant Date: 20120501
Implantable Lead Implant Date: 20120501
Implantable Lead Location: 753859
Implantable Lead Location: 753860
Implantable Pulse Generator Implant Date: 20120501
Lead Channel Impedance Value: 400 Ohm
Lead Channel Impedance Value: 480 Ohm
Lead Channel Sensing Intrinsic Amplitude: 1.435 mV
Lead Channel Sensing Intrinsic Amplitude: 13.542 mV
Lead Channel Setting Pacing Amplitude: 2 V
Lead Channel Setting Pacing Amplitude: 2.5 V
Lead Channel Setting Pacing Pulse Width: 0.4 ms
Lead Channel Setting Sensing Sensitivity: 0.9 mV

## 2019-07-11 ENCOUNTER — Other Ambulatory Visit: Payer: Self-pay | Admitting: Cardiovascular Disease

## 2019-07-15 NOTE — Progress Notes (Signed)
Remote pacemaker transmission.   

## 2019-08-28 ENCOUNTER — Ambulatory Visit (INDEPENDENT_AMBULATORY_CARE_PROVIDER_SITE_OTHER): Payer: BC Managed Care – PPO | Admitting: Cardiovascular Disease

## 2019-08-28 ENCOUNTER — Other Ambulatory Visit: Payer: Self-pay

## 2019-08-28 ENCOUNTER — Encounter: Payer: BC Managed Care – PPO | Admitting: Cardiovascular Disease

## 2019-08-28 ENCOUNTER — Encounter: Payer: Self-pay | Admitting: Cardiovascular Disease

## 2019-08-28 VITALS — BP 138/84 | HR 68 | Ht 74.0 in | Wt 248.8 lb

## 2019-08-28 DIAGNOSIS — E669 Obesity, unspecified: Secondary | ICD-10-CM | POA: Diagnosis not present

## 2019-08-28 DIAGNOSIS — E785 Hyperlipidemia, unspecified: Secondary | ICD-10-CM | POA: Diagnosis not present

## 2019-08-28 DIAGNOSIS — Z950 Presence of cardiac pacemaker: Secondary | ICD-10-CM | POA: Diagnosis not present

## 2019-08-28 DIAGNOSIS — I10 Essential (primary) hypertension: Secondary | ICD-10-CM | POA: Diagnosis not present

## 2019-08-28 DIAGNOSIS — R55 Syncope and collapse: Secondary | ICD-10-CM | POA: Diagnosis not present

## 2019-08-28 NOTE — Progress Notes (Signed)
Cardiology Office Note    Date:  08/28/2019   ID:  NENO HOHENSEE, DOB [birth year], MRN [id number]  PCP:  Babs Sciara, MD  Cardiologist:   Thurmon Fair, MD   Chief Complaint  Patient presents with  . Loss of Consciousness    History of Present Illness:  [name] is a 64 y.o. 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    [name] is doing quite well.  He took his blood pressure monitor from home and to be checked and found that it was consistently overestimating his blood pressure.  He stopped taking the losartan and his blood pressure at home is now in the 120s-low 130s/70s-low 80s.  He has not had any syncopal or near syncopal events.  He continues to work night shift.  He has difficulty losing weight.  He denies angina or dyspnea with activity and has not had lower extremity edema, palpitations, focal neurological events or claudication.  He is done well since his last appointment.  He was unable to perform a remote pacemaker download and were trying to get him a new transmitter.  He has not had episodes of syncope or near syncope.  He denies chest pain or shortness of breath at rest or with activity and continues to perform hard physical labor.  He denies leg edema, claudication, focal neurological events.  In office pacemaker interrogation shows normal device function.  His Medtronic Revo dual-chamber pacemaker was implanted in 2012 and has a battery voltage of 2.89 V (ERI 2.81 V.  He has 56% atrial pacing and never requires ventricular pacing.  As before, activity level remains relatively high at about 4 hours a day, heart rate histogram distribution is appropriate and has had a few recorded episodes of high a and V rates that are  all consistent with sinus tachycardia during physical activity.  No true arrhythmia seen.  [name] remains obese and continues have a low HDL cholesterol level, although his other lipid parameters are not bad.  Past Medical History:  Diagnosis Date  . Arthritis   . Barrett's esophagus   . GERD (gastroesophageal reflux disease)   . Hypercholesterolemia    triglycerides elevated  . Hypotension   . Pacemaker   . Syncope     Past Surgical History:  Procedure Laterality Date  . birthmark removal     right thigh  . BLADDER SURGERY     stretched  . CARDIAC CATHETERIZATION  09/15/2006   Recommendation - empiric anti-reflux therapy  . CARDIOVASCULAR STRESS TEST  04/08/2011   No scintigraphic evidence of inducible myocardial ischemia. No lexiscan EKG changes. Non-diagnostic for ischemia.  . CERVICAL SPINE SURGERY     plates and screws  . COLONOSCOPY    . LOWER EXTREMITY ARTERIAL DOPPLER  10/02/2006   No evidence of thrombus or thrombphlebitis.  [name] PACEMAKER INSERTION  06/08/2010   Medtronic Revo model #RVDR01, serial [id number]  . POLYPECTOMY     hyperplastic  . TONSILLECTOMY    . TRANSTHORACIC ECHOCARDIOGRAM  04/06/2011   EF 55-60%, normal  . UPPER GASTROINTESTINAL ENDOSCOPY    . WISDOM TOOTH EXTRACTION      Current Medications: Outpatient Medications Prior to Visit  Medication Sig Dispense Refill  . aspirin EC 81 MG tablet Take 81 mg by mouth daily.    . fenofibrate (  TRICOR) 145 MG tablet TAKE 1 TABLET (145 MG TOTAL) BY MOUTH DAILY. KEEP OV. 90 tablet 1  . Multiple Vitamin (MULTIVITAMIN) tablet Take 1 tablet by mouth daily.    . Naphazoline HCl (CLEAR EYES OP) Place 2 drops into both eyes daily as needed (for dry eyes).    [name] omeprazole (PRILOSEC) 20 MG capsule TAKE 1 CAPSULE BY MOUTH EVERY DAY 90 capsule 3  . losartan (COZAAR) 25 MG tablet Take 1 tablet (25 mg total) by mouth daily. 90 tablet 3   Facility-Administered Medications Prior to Visit  Medication Dose Route Frequency  Provider Last Rate Last Admin  . 0.9 %  sodium chloride infusion  500 mL Intravenous Once Rachael Fee, MD         Allergies:   Other, Norvasc [amlodipine besylate], Levofloxacin, and Sulfonamide derivatives   Social History   Socioeconomic History  . Marital status: Married    Spouse name: Not on file  . Number of children: 2  . Years of education: Not on file  . Highest education level: Not on file  Occupational History  . Occupation: Education officer, museum, Estate agent, 3rd shift    Employer: [employer]  Tobacco Use  . Smoking status: Former Smoker    Types: Cigarettes    Quit date: 11/27/1973    Years since quitting: 45.7  . Smokeless tobacco: Never Used  Substance and Sexual Activity  . Alcohol use: No  . Drug use: No  . Sexual activity: Not on file  Other Topics Concern  . Not on file  Social History Narrative   Lives with wife, who is a Engineer, civil (consulting).   Social Determinants of Health   Financial Resource Strain:   . Difficulty of Paying Living Expenses:   Food Insecurity:   . Worried About Programme researcher, broadcasting/film/video in the Last Year:   . Barista in the Last Year:   Transportation Needs:   . Freight forwarder (Medical):   [name] Lack of Transportation (Non-Medical):   Physical Activity:   . Days of Exercise per Week:   . Minutes of Exercise per Session:   Stress:   . Feeling of Stress :   Social Connections:   . Frequency of Communication with Friends and Family:   . Frequency of Social Gatherings with Friends and Family:   . Attends Religious Services:   . Active Member of Clubs or Organizations:   . Attends Banker Meetings:   [name] Marital Status:      Family History:  The patient's family history includes COPD in his sister; Esophageal cancer in his maternal uncle; Heart attack in his father and sister; Heart failure in his father; Hyperlipidemia in his brother and sister; Hypertension in his brother; Lymphoma in his brother; Ovarian cancer  in his mother.   ROS:   Please see the history of present illness.   All other systems are reviewed and are negative.   PHYSICAL EXAM:   VS:  BP 138/84   Pulse 68   Ht 6\' 2"  (1.88 m)   Wt 248 lb 12.8 oz (112.9 kg)   SpO2 98%   BMI 31.94 kg/m     General: Alert, oriented x3, no distress, mildly obese.  Healthy left subclavian pacemaker site Head: no evidence of trauma, PERRL, EOMI, no exophtalmos or lid lag, no myxedema, no xanthelasma; normal ears, nose and oropharynx Neck: normal jugular venous pulsations and no hepatojugular reflux; brisk carotid pulses without delay and no  carotid bruits Chest: clear to auscultation, no signs of consolidation by percussion or palpation, normal fremitus, symmetrical and full respiratory excursions Cardiovascular: normal position and quality of the apical impulse, regular rhythm, normal first and second heart sounds, no murmurs, rubs or gallops Abdomen: no tenderness or distention, no masses by palpation, no abnormal pulsatility or arterial bruits, normal bowel sounds, no hepatosplenomegaly Extremities: no clubbing, cyanosis or edema; 2+ radial, ulnar and brachial pulses bilaterally; 2+ right femoral, posterior tibial and dorsalis pedis pulses; 2+ left femoral, posterior tibial and dorsalis pedis pulses; no subclavian or femoral bruits Neurological: grossly nonfocal Psych: Normal mood and affect   Wt Readings from Last 3 Encounters:  08/28/19 248 lb 12.8 oz (112.9 kg)  02/27/19 249 lb (112.9 kg)  01/16/19 244 lb 6.4 oz (110.9 kg)      Studies/Labs Reviewed:   EKG:  EKG is ordered today.  It shows atrial paced, ventricular sensed rhythm with a relatively long AV delay of 274 ms, otherwise normal tracing  Recent Labs: 01/16/2019: ALT 21; BUN 23; Creatinine, Ser 1.20; Potassium 4.2; Sodium 140; TSH 1.790   Lipid Panel    Component Value Date/Time   CHOL 173 01/16/2019 1408   TRIG 156 (H) 01/16/2019 1408   HDL 30 (L) 01/16/2019 1408   CHOLHDL  5.8 (H) 01/16/2019 1408   CHOLHDL 5.3 (H) 05/31/2018 0944   VLDL 30 12/28/2015 0827   LDLCALC 115 (H) 01/16/2019 1408   LDLCALC 101 (H) 05/31/2018 0944      ASSESSMENT:    1. Neurocardiogenic syncope   2. Dyslipidemia   3. Mild obesity   4. Pacemaker   5. Essential hypertension      PLAN:  In order of problems listed above:  1. Neurocardiogenic syncope: Inherited his tendency to vasovagal syncope from his father.  No syncope since pacemaker implantation.  Since last year he has not even had any episodes of near syncope. 2. HLP: He has low HDL, no other lipid abnormalities.  Encouraged weight loss. 3. Obesity: Reviewed the importance of a lower calorie diet and reduction in saturated fat, simple carbohydrates and starches with high glycemic index. 4. PPM: Normal device function. His device is MRI conditional.  Remote check every 3 months and office visit yearly. 5. HTN: This may have been a spurious diagnosis based on erroneous data, but even if he does have hypertension it is quite mild.  At risk for developing full-blown hypertension due to his weight.  I would prefer that he not take antihypertensives for mild hypertension due to his risk for syncope.    Medication Adjustments/Labs and Tests Ordered: Current medicines are reviewed at length with the patient today.  Concerns regarding medicines are outlined above.  Medication changes, Labs and Tests ordered today are listed in the Patient Instructions below. Patient Instructions  Medication Instructions:  No chnages *If you need a refill on your cardiac medications before your next appointment, please call your pharmacy*   Lab Work: None ordered If you have labs (blood work) drawn today and your tests are completely normal, you will receive your results only by: [name]. MyChart Message (if you have MyChart) OR . A paper copy in the mail If you have any lab test that is abnormal or we need to change your treatment, we will call  you to review the results.   Testing/Procedures: None ordered   Follow-Up: At [facility], you and your health needs are our priority.  As part of our continuing mission to provide  you with exceptional heart care, we have created designated Provider Care Teams.  These Care Teams include your primary Cardiologist (physician) and Advanced Practice Providers (APPs -  Physician Assistants and Nurse Practitioners) who all work together to provide you with the care you need, when you need it.  We recommend signing up for the patient portal called "MyChart".  Sign up information is provided on this After Visit Summary.  MyChart is used to connect with patients for Virtual Visits (Telemedicine).  Patients are able to view lab/test results, encounter notes, upcoming appointments, etc.  Non-urgent messages can be sent to your provider as well.   To learn more about what you can do with MyChart, go to [email or web address].    Your next appointment:   12 month(s)  The format for your next appointment:   In Person  Provider:   Thurmon Fair, MD       Signed, Thurmon Fair, MD  08/28/2019 2:48 PM    [facility] [address] [place], [place], [place]  [address] Phone: [phone]; Fax: [phone]

## 2019-08-28 NOTE — Patient Instructions (Signed)
Medication Instructions:  No chnages *If you need a refill on your cardiac medications before your next appointment, please call your pharmacy*   Lab Work: None ordered If you have labs (blood work) drawn today and your tests are completely normal, you will receive your results only by: MyChart Message (if you have MyChart) OR A paper copy in the mail If you have any lab test that is abnormal or we need to change your treatment, we will call you to review the results.   Testing/Procedures: None ordered   Follow-Up: At [facility], you and your health needs are our priority.  As part of our continuing mission to provide you with exceptional heart care, we have created designated Provider Care Teams.  These Care Teams include your primary Cardiologist (physician) and Advanced Practice Providers (APPs -  Physician Assistants and Nurse Practitioners) who all work together to provide you with the care you need, when you need it.  We recommend signing up for the patient portal called "MyChart".  Sign up information is provided on this After Visit Summary.  MyChart is used to connect with patients for Virtual Visits (Telemedicine).  Patients are able to view lab/test results, encounter notes, upcoming appointments, etc.  Non-urgent messages can be sent to your provider as well.   To learn more about what you can do with MyChart, go to [email or web address].    Your next appointment:   12 month(s)  The format for your next appointment:   In Person  Provider:   Mihai Croitoru, MD      

## 2019-09-05 ENCOUNTER — Telehealth: Payer: Self-pay | Admitting: Cardiovascular Disease

## 2019-09-05 NOTE — Telephone Encounter (Signed)
Spoke with patient about scheduling follow up visit with Croituru from recall list, patient stated they no longer needed to be seen

## 2019-09-08 ENCOUNTER — Emergency Department (HOSPITAL_COMMUNITY)
Admission: EM | Admit: 2019-09-08 | Discharge: 2019-09-09 | Disposition: A | Discharge status: Home / Self Care | Payer: BC Managed Care – PPO | Attending: Emergency Medicine | Admitting: Emergency Medicine

## 2019-09-08 ENCOUNTER — Encounter (HOSPITAL_COMMUNITY): Payer: Self-pay | Admitting: *Deleted

## 2019-09-08 ENCOUNTER — Other Ambulatory Visit: Payer: Self-pay

## 2019-09-08 DIAGNOSIS — Z87891 Personal history of nicotine dependence: Secondary | ICD-10-CM | POA: Insufficient documentation

## 2019-09-08 DIAGNOSIS — R110 Nausea: Secondary | ICD-10-CM | POA: Insufficient documentation

## 2019-09-08 DIAGNOSIS — Z7982 Long term (current) use of aspirin: Secondary | ICD-10-CM | POA: Diagnosis not present

## 2019-09-08 DIAGNOSIS — R079 Chest pain, unspecified: Secondary | ICD-10-CM | POA: Diagnosis not present

## 2019-09-08 DIAGNOSIS — R0789 Other chest pain: Secondary | ICD-10-CM | POA: Diagnosis not present

## 2019-09-08 DIAGNOSIS — I2510 Atherosclerotic heart disease of native coronary artery without angina pectoris: Secondary | ICD-10-CM | POA: Diagnosis not present

## 2019-09-08 DIAGNOSIS — Z950 Presence of cardiac pacemaker: Secondary | ICD-10-CM | POA: Insufficient documentation

## 2019-09-08 LAB — CBC
HCT: 41 % (ref 39.0–52.0)
Hemoglobin: 13.8 g/dL (ref 13.0–17.0)
MCH: 32.9 pg (ref 26.0–34.0)
MCHC: 33.7 g/dL (ref 30.0–36.0)
MCV: 97.6 fL (ref 80.0–100.0)
Platelets: 195 10*3/uL (ref 150–400)
RBC: 4.2 MIL/uL — ABNORMAL LOW (ref 4.22–5.81)
RDW: 12.7 % (ref 11.5–15.5)
WBC: 7.3 10*3/uL (ref 4.0–10.5)
nRBC: 0 % (ref 0.0–0.2)

## 2019-09-08 MED ORDER — SODIUM CHLORIDE 0.9% FLUSH: 3.0000 mL | Freq: Once | INTRAVENOUS | Status: DC

## 2019-09-08 NOTE — ED Triage Notes (Signed)
The pt has a pacemaker 

## 2019-09-08 NOTE — ED Triage Notes (Signed)
The pt has had some chest pain since 2200 tonight  Almost gone now   He had some sl nausea no distress

## 2019-09-09 ENCOUNTER — Emergency Department (HOSPITAL_COMMUNITY): Payer: BC Managed Care – PPO

## 2019-09-09 DIAGNOSIS — R0789 Other chest pain: Secondary | ICD-10-CM | POA: Diagnosis not present

## 2019-09-09 DIAGNOSIS — R079 Chest pain, unspecified: Secondary | ICD-10-CM | POA: Diagnosis not present

## 2019-09-09 DIAGNOSIS — R110 Nausea: Secondary | ICD-10-CM | POA: Diagnosis not present

## 2019-09-09 LAB — TROPONIN I (HIGH SENSITIVITY)
Troponin I (High Sensitivity): 8 ng/L (ref <18)
Troponin I (High Sensitivity): 8 ng/L (ref <18)

## 2019-09-09 LAB — BASIC METABOLIC PANEL
Anion gap: 8 (ref 5–15)
BUN: 22 mg/dL (ref 8–23)
CO2: 25 mmol/L (ref 22–32)
Calcium: 9.9 mg/dL (ref 8.9–10.3)
Chloride: 109 mmol/L (ref 98–111)
Creatinine, Ser: 1.3 mg/dL — ABNORMAL HIGH (ref 0.61–1.24)
GFR calc Af Amer: >60 mL/min (ref >60)
GFR calc non Af Amer: 58 mL/min — ABNORMAL LOW (ref >60)
Glucose, Bld: 94 mg/dL (ref 70–99)
Potassium: 4 mmol/L (ref 3.5–5.1)
Sodium: 142 mmol/L (ref 135–145)

## 2019-09-09 NOTE — ED Provider Notes (Signed)
[facility] EMERGENCY DEPARTMENT Provider Note   CSN: [id number] Arrival date & time: 09/08/19  2330     History Chief Complaint  Patient presents with  . Chest Pain    [name] is a 64 y.o. male.  HPI 64 year old male history GERD, hypertension, pacemaker, neurocardiogenic syncope, obstructive sleep apnea, presents to the ER with a 5-minute episode of chest pain around 2200 last night.  He describes the pain as "a bee sting him", sharp.  No shortness of breath.  Lasted approximately 5 minutes.  He endorses some associated nausea, but no vomiting.  He states that he is not sure if the pain was traveling into his shoulder as he does have a history of left shoulder bursitis.  No diaphoresis.  Patient states that he was driving when the event occurred.  He states this is happened again at rest at around 5 AM waiting in the ER, though this was much shorter, lasting approximately a minute.  He is not on anticoagulation.  Denies any symptoms here in the ER now.  States that this has happened to him once before, and he came to the ER and had a work-up done which did not show any abnormalities.  Per chart review, this occurred back in August 2018.  Patient is followed by Dr. Royann Shivers.  He does have a pacemaker for neurocardiogenic syncope.  No noticeable leg swelling, no shortness of breath on exertion.  No history of MI.    Past Medical History:  Diagnosis Date  . Arthritis   . Barrett's esophagus   . GERD (gastroesophageal reflux disease)   . Hypercholesterolemia    triglycerides elevated  . Hypotension   . Pacemaker   . Syncope     Patient Active Problem List   Diagnosis Date Noted  . Mild obesity 01/05/2016  . Neurocardiogenic syncope 12/16/2012  . OSA (obstructive sleep apnea) - presumptive diagnosis 12/16/2012  . Dyspnea 11/09/2012  . Acute prostatitis 05/04/2012  . Chest pain 04/06/2011  . Pacemaker, MDT 5/12 for SB 04/06/2011  . Dehydration 04/06/2011    . Renal insufficiency, secondary to dehydration? Cr 1.7 on admission 04/06/2011  . Dyslipidemia 04/06/2011  . Family history of coronary artery disease 04/06/2011  . Hx of cardiac cath in 2008 and April 2012 showing no significant CAD 04/06/2011    Past Surgical History:  Procedure Laterality Date  . birthmark removal     right thigh  . BLADDER SURGERY     stretched  . CARDIAC CATHETERIZATION  09/15/2006   Recommendation - empiric anti-reflux therapy  . CARDIOVASCULAR STRESS TEST  04/08/2011   No scintigraphic evidence of inducible myocardial ischemia. No lexiscan EKG changes. Non-diagnostic for ischemia.  . CERVICAL SPINE SURGERY     plates and screws  . COLONOSCOPY    . LOWER EXTREMITY ARTERIAL DOPPLER  10/02/2006   No evidence of thrombus or thrombphlebitis.  [name] PACEMAKER INSERTION  06/08/2010   Medtronic Revo model #RVDR01, serial [id number]  . POLYPECTOMY     hyperplastic  . TONSILLECTOMY    . TRANSTHORACIC ECHOCARDIOGRAM  04/06/2011   EF 55-60%, normal  . UPPER GASTROINTESTINAL ENDOSCOPY    . WISDOM TOOTH EXTRACTION         Family History  Problem Relation Age of Onset  . Ovarian cancer Mother   . Heart failure Father   . Heart attack Father   . Heart attack Sister   . Lymphoma Brother        x 2  .  COPD Sister   . Hypertension Brother   . Hyperlipidemia Brother   . Hyperlipidemia Sister   . Esophageal cancer Maternal Uncle   . Colon cancer Neg Hx   . Stomach cancer Neg Hx   . Colon polyps Neg Hx   . Rectal cancer Neg Hx     Social History   Tobacco Use  . Smoking status: Former Smoker    Types: Cigarettes    Quit date: 11/27/1973    Years since quitting: 45.8  . Smokeless tobacco: Never Used  Substance Use Topics  . Alcohol use: No  . Drug use: No    Home Medications Prior to Admission medications   Medication Sig Start Date End Date Taking? Authorizing Provider  aspirin EC 81 MG tablet Take 81 mg by mouth daily.    [provider]   fenofibrate (TRICOR) 145 MG tablet TAKE 1 TABLET (145 MG TOTAL) BY MOUTH DAILY. KEEP OV. 07/11/19   Croitoru, Mihai, MD  Multiple Vitamin (MULTIVITAMIN) tablet Take 1 tablet by mouth daily.    [provider]  Naphazoline HCl (CLEAR EYES OP) Place 2 drops into both eyes daily as needed (for dry eyes).    [provider]  omeprazole (PRILOSEC) 20 MG capsule TAKE 1 CAPSULE BY MOUTH EVERY DAY 03/09/18   Rachael Fee, MD    Allergies    Other, Norvasc [amlodipine besylate], Levofloxacin, and Sulfonamide derivatives  Review of Systems   Review of Systems  Constitutional: Negative for chills and fever.  HENT: Negative for ear pain and sore throat.   Eyes: Negative for pain and visual disturbance.  Respiratory: Negative for cough and shortness of breath.   Cardiovascular: Positive for chest pain. Negative for palpitations.  Gastrointestinal: Positive for nausea. Negative for abdominal pain and vomiting.  Genitourinary: Negative for dysuria and hematuria.  Musculoskeletal: Negative for arthralgias and back pain.  Skin: Negative for color change and rash.  Neurological: Negative for seizures and syncope.  All other systems reviewed and are negative.   Physical Exam Updated Vital Signs BP (!) 134/88 (BP Location: Right Arm)   Pulse 60   Temp 97.7 F (36.5 C) (Oral)   Resp 15   Ht 6\' 2"  (1.88 m)   Wt (!) 110.2 kg   SpO2 99%   BMI 31.20 kg/m   Physical Exam Vitals reviewed.  Constitutional:      General: He is not in acute distress.    Appearance: Normal appearance. He is well-developed. He is not ill-appearing, toxic-appearing or diaphoretic.  HENT:     Head: Normocephalic.  Cardiovascular:     Rate and Rhythm: Normal rate and regular rhythm.     Pulses: Normal pulses.          Radial pulses are 2+ on the right side and 2+ on the left side.       Dorsalis pedis pulses are 2+ on the right side and 2+ on the left side.     Heart sounds: Normal heart sounds, S1  normal and S2 normal. No murmur heard.  No friction rub.  Pulmonary:     Effort: Pulmonary effort is normal.     Breath sounds: Normal breath sounds.     Comments: Lung sounds clear, equal chest rise, no increased work of breathing speaking full sentences Chest:     Chest wall: No tenderness.  Abdominal:     General: Abdomen is flat. Bowel sounds are normal.     Palpations: Abdomen is soft.  Musculoskeletal:        General: Normal range of motion.     Right lower leg: No edema.     Left lower leg: No edema.     Comments: No lower extremity edema  Skin:    General: Skin is warm and dry.     Capillary Refill: Capillary refill takes less than 2 seconds.  Neurological:     General: No focal deficit present.     Mental Status: He is alert.     ED Results / Procedures / Treatments   Labs (all labs ordered are listed, but only abnormal results are displayed) Labs Reviewed  BASIC METABOLIC PANEL - Abnormal; Notable for the following components:      Result Value   Creatinine, Ser 1.30 (*)    GFR calc non Af Amer 58 (*)    All other components within normal limits  CBC - Abnormal; Notable for the following components:   RBC 4.20 (*)    All other components within normal limits  TROPONIN I (HIGH SENSITIVITY)  TROPONIN I (HIGH SENSITIVITY)    EKG EKG Interpretation  Date/Time:  Sunday September 08 2019 23:34:51 EDT Ventricular Rate:  61 PR Interval:    QRS Duration: 108 QT Interval:  398 QTC Calculation: 400 R Axis:   -15 Text Interpretation: Atrial-paced rhythm Abnormal ECG No STEMI Confirmed by Trifan, Matthew ([id number]) on 09/09/2019 8:55:01 AM   Radiology DG Chest 2 View  Result Date: 09/09/2019 CLINICAL DATA:  Chest pain earlier tonight, resolving. Slight nausea. EXAM: CHEST - 2 VIEW COMPARISON:  None. FINDINGS: Cardiac pacemaker. Borderline heart size. No focal consolidation or edema. No pleural effusions. No pneumothorax. Mediastinal contours appear intact. Calcified and  tortuous aorta. Postoperative changes in the cervical spine. IMPRESSION: No active cardiopulmonary disease. Electronically Signed   By: William  Stevens M.D.   On: 09/09/2019 00:22    Procedures Procedures (including critical care time)  Medications Ordered in ED Medications  sodium chloride flush (NS) 0.9 % injection 3 mL (3 mLs Intravenous Not Given 09/09/19 0853)    ED Course  I have reviewed the triage vital signs and the nursing notes.  Pertinent labs & imaging results that were available during my care of the patient were reviewed by me and considered in my medical decision making (see chart for details).    MDM Rules/Calculators/A&P                         64  year old male with 2 short episodes of chest pain occurring in the last 24 hours On presentation, the patient is alert, oriented, nontoxic-appearing, no acute distress, speaking full sentences, without increased work of breathing, nondiaphoretic.  Resting comfortably in the ER bed.  Vital signs are all reassuring, he is not hypoxic, tachycardic, with a normal blood pressure.  Initially blood pressure on presentation was 155/107, improved throughout the ED course.  Physical exam without any lower extremity edema, clear lung sounds, soft nontender abdomen, normal heart sounds.  I personally reviewed and interpreted his lab work, BMP without any electrolyte abnormalities, slightly increased creatinine of 1.3 which seems to be at baseline.  CBC without acute cytosis, normal hemoglobin.  Delta troponin negative.  Chest x-ray with no acute abnormalities.  EKG without any significant changes from previous.  Heart score of 3.  Patient is currently asymptomatic here in the ER.  Concern for PE low as he is not hypoxic, does not complain of short of breath, no  tachycardia.  Low concern for dissection, no abdominal pain, back pain, normotensive.  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  Medtronic pacemaker was interrogated without any acute events.  Discussed the case with Dr. Renaye Rakersrifan, given that the patient is asymptomatic currently, with a negative cardiac work-up, we feel that he is stable for discharge at this time with close follow-up with Dr. Royann Shiversroitoru with cardiology.  Patient is also agreeable to this and voices understanding.  Very strict return precautions discussed which include worsening shortness of breath or chest pain.  All the patient's questions have been answered to his satisfaction, he voices understanding and is agreeable to this plan.  Final Clinical Impression(s) / ED Diagnoses Final diagnoses:  Chest pain, unspecified type    Rx / DC Orders ED Discharge Orders    None       Leone BrandBelaya, Tiea Manninen A, PA-C 09/09/19 1032    Terald Sleeperrifan, Matthew J, MD 09/09/19 1758

## 2019-09-09 NOTE — Discharge Instructions (Signed)
Your work-up today was overall reassuring.  Your pacemaker did not show any evidence of cardiac events.  Please make sure to return to the ER if you have any worsening shortness of breath, chest pain that becomes more consistent.  You are at a slight increase of risk for a blood clot given your frequent driving.  Please make sure to follow-up with Dr. Royann Shivers about the symptoms you are experiencing today.

## 2020-01-05 ENCOUNTER — Other Ambulatory Visit: Payer: Self-pay | Admitting: Cardiovascular Disease

## 2020-03-04 ENCOUNTER — Ambulatory Visit: Payer: BC Managed Care – PPO | Admitting: Family Medicine

## 2020-03-04 ENCOUNTER — Other Ambulatory Visit: Payer: Self-pay

## 2020-03-04 ENCOUNTER — Telehealth: Payer: Self-pay | Admitting: Family Medicine

## 2020-03-04 ENCOUNTER — Encounter: Payer: Self-pay | Admitting: Family Medicine

## 2020-03-04 VITALS — BP 146/88 | HR 68 | Temp 98.2°F | Ht 74.0 in | Wt 256.0 lb

## 2020-03-04 DIAGNOSIS — Z1211 Encounter for screening for malignant neoplasm of colon: Secondary | ICD-10-CM | POA: Diagnosis not present

## 2020-03-04 DIAGNOSIS — Z0000 Encounter for general adult medical examination without abnormal findings: Secondary | ICD-10-CM

## 2020-03-04 DIAGNOSIS — Z125 Encounter for screening for malignant neoplasm of prostate: Secondary | ICD-10-CM

## 2020-03-04 DIAGNOSIS — M25532 Pain in left wrist: Secondary | ICD-10-CM | POA: Diagnosis not present

## 2020-03-04 NOTE — Telephone Encounter (Signed)
Last labs 01/16/19 lipid, liver, bmp, tsh

## 2020-03-04 NOTE — Telephone Encounter (Signed)
Patient needing lab work done for physical in April

## 2020-03-04 NOTE — Progress Notes (Signed)
Patient ID: TRAI ELLS, male    DOB: Jun 10, 1955, 65 y.o.   MRN: [id number]   Chief Complaint  Patient presents with  . Wrist Pain   Subjective:  CC: left wrist pain   This is a new problem.  Presents today with a complaint of my left wrist is bothering me.  Symptoms started on Wednesday morning waking up with pain pain severity on Wednesday was 6/10, on Thursday severity was 8/10.  Tried ibuprofen x1 which has helped significantly.  Pain today is 1/10.  Denies fever, chills, chest pain, shortness of breath, numbness, tingling, radiating pain.  Sees cardiology who manages his hyperlipidemia and blood pressure, reports that cardiology keeps his blood pressure on the high side due to his history of syncope.  Sees GI who prescribed a PPI, for his Barrett's esophagus, his last colonoscopy was done 2011.  Referral will be made today for routine colonoscopy.  In 2011, he had a polyp removed his follow-up should have been 5 years.  He is due for an annual wellness, last done on January 04, 2017.   left wrist pain for one week. A little better now. Tried ibuprofen.    Medical History Donaldson has a past medical history of Arthritis, Barrett's esophagus, GERD (gastroesophageal reflux disease), Hypercholesterolemia, Hypotension, Pacemaker, and Syncope.   Outpatient Encounter Medications as of 03/04/2020  Medication Sig  . aspirin EC 81 MG tablet Take 81 mg by mouth daily.  . fenofibrate (TRICOR) 145 MG tablet TAKE 1 TABLET (145 MG TOTAL) BY MOUTH DAILY. KEEP OV.  . Multiple Vitamin (MULTIVITAMIN) tablet Take 1 tablet by mouth daily.  . Naphazoline HCl (CLEAR EYES OP) Place 2 drops into both eyes daily as needed (for dry eyes).  [name] omeprazole (PRILOSEC) 20 MG capsule TAKE 1 CAPSULE BY MOUTH EVERY DAY   Facility-Administered Encounter Medications as of 03/04/2020  Medication  . 0.9 %  sodium chloride infusion     Review of Systems  Constitutional: Negative for chills and fever.  Respiratory:  Negative for shortness of breath.   Cardiovascular: Negative for chest pain.  Gastrointestinal: Negative for abdominal pain.  Musculoskeletal:       Left wrist pain -- resolved almost completely.      Vitals BP (!) 146/88   Pulse 68   Temp 98.2 F (36.8 C)   Ht 6\' 2"  (1.88 m)   Wt 256 lb (116.1 kg)   SpO2 98%   BMI 32.87 kg/m   Objective:   Physical Exam Vitals reviewed.  Cardiovascular:     Rate and Rhythm: Normal rate and regular rhythm.     Heart sounds: Normal heart sounds.  Pulmonary:     Effort: Pulmonary effort is normal.     Breath sounds: Normal breath sounds.  Musculoskeletal:        General: No swelling, tenderness, deformity or signs of injury.     Comments: 5/5 upper extremity strength. No numbness, no tingling. Negative Phalen and Tinel sign on left. Pain has improved since Thursday. 1/10 today.   Skin:    General: Skin is warm and dry.  Neurological:     General: No focal deficit present.     Mental Status: He is alert.  Psychiatric:        Behavior: Behavior normal.      Assessment and Plan   1. Left wrist pain  2. Screening for colon cancer - Ambulatory referral to Gastroenterology   Pain much improved today. No numbness, tingling, radiating pain.  Will watch and wait, he will return if pain returns.  Symptoms have almost resolved.  Negative Phalen, negative Tinel.  5/5 upper body strength, warning signs discussed.  Agrees with plan of care discussed today. Understands warning signs to seek further care: chest pain, shortness of breath, any significant change in health.  Understands to follow-up with Dr. Lilyan Punt, for annual wellness.  Referral sent for GI for colonoscopy.  He will let us know if his left wrist pain returns/worsens.    Dorena Bodo, NP 03/04/2020

## 2020-03-04 NOTE — Patient Instructions (Signed)
Wrist Pain, Adult There are many things that can cause wrist pain. Some common causes include:  An injury to the wrist.  Using the joint too much.  A condition that causes too much pressure to be put on a nerve in the wrist (carpal tunnel syndrome).  Wear and tear of the joints that happens as a person gets older (osteoarthritis).  A condition that causes swelling and stiffness in the joints (arthritis). Sometimes, the cause of wrist pain is not known. Often, the pain goes away when you follow your doctor's instructions for easing pain at home. This may include resting your wrist, icing your wrist, or using a splint or an elastic wrap for a short time. It is important to tell your doctor if your wrist pain does not go away. Follow these instructions at home: If you have a splint or elastic wrap:  Wear the splint or wrap as told by your doctor. Take it off only as told by your doctor. Ask if you can take it off for bathing.  Loosen the splint or wrap if your fingers: ? Tingle. ? Become numb. ? Turn cold and blue.  Check the skin around the splint or wrap every day. Tell your doctor about any concerns.  Keep the splint or wrap clean.  If the splint or wrap is not waterproof: ? Do not let it get wet. ? Cover it with a watertight covering when you take a bath or shower. Managing pain, stiffness, and swelling  If told, put ice on the painful area. To do this: ? If you have a removable splint or wrap, take it off as told by your doctor. ? Put ice in a plastic bag. ? Place a towel between your skin and the bag or between your splint or wrap and the bag. ? Leave the ice on for 20 minutes, 2-3 times a day.  Move your fingers often.  Raise (elevate) the injured area above the level of your heart while you are sitting or lying down.   Activity  Rest your wrist as told by your doctor.  Return to your normal activities as told by your doctor. Ask your doctor what activities are safe  for you.  Ask your doctor when it is safe to drive if you have a splint or wrap on your wrist.  Do exercises as told by your doctor. General instructions  Pay attention to any changes in your symptoms.  Take over-the-counter and prescription medicines only as told by your doctor.  Keep all follow-up visits as told by your doctor. This is important. Contact a doctor if:  You have a sudden, sharp pain in the wrist, hand, or arm that is different or new.  The swelling or bruising on your wrist or hand gets worse.  Your skin: ? Becomes red. ? Gets a rash. ? Has open sores.  Your pain does not get better.  Your pain gets worse.  You have a fever or chills. Get help right away if:  You lose feeling in your fingers or hand.  Your fingers turn white, very red, or cold and blue.  You cannot move your fingers. Summary  There are many things that can cause wrist pain.  It is important to tell your doctor if your wrist pain does not go away.  You may need to wear a splint or a wrap for a short period of time.  Return to your normal activities as told by your doctor. Ask your doctor   what activities are safe for you. This information is not intended to replace advice given to you by your health care provider. Make sure you discuss any questions you have with your health care provider. Document Revised: 12/13/2018 Document Reviewed: 12/13/2018 Elsevier Patient Education  2021 [employer]vier Inc.  

## 2020-03-05 NOTE — Telephone Encounter (Signed)
Lipid, CMP, PSA

## 2020-03-05 NOTE — Telephone Encounter (Signed)
Orders put in and mailed to pt with a note to do in April before wellness

## 2020-03-23 ENCOUNTER — Encounter: Payer: Self-pay | Admitting: Gastroenterology

## 2020-05-11 ENCOUNTER — Ambulatory Visit (AMBULATORY_SURGERY_CENTER): Payer: Self-pay

## 2020-05-11 ENCOUNTER — Other Ambulatory Visit: Payer: Self-pay

## 2020-05-11 ENCOUNTER — Other Ambulatory Visit: Payer: Self-pay | Admitting: Gastroenterology

## 2020-05-11 VITALS — Ht 74.0 in | Wt 258.0 lb

## 2020-05-11 DIAGNOSIS — Z86010 Personal history of colonic polyps: Secondary | ICD-10-CM

## 2020-05-11 MED ORDER — NA SULFATE-K SULFATE-MG SULF 17.5-3.13-1.6 GM/177ML PO SOLN: 1.0000 | Freq: Once | ORAL | 0 refills | Status: AC

## 2020-05-11 NOTE — Progress Notes (Signed)
No egg or soy allergy known to patient  No issues with past sedation with any surgeries or procedures Patient denies ever being told they had issues or difficulty with intubation  No FH of Malignant Hyperthermia No diet pills per patient No home 02 use per patient  No blood thinners per patient  Pt denies issues with constipation  No A fib or A flutter  COVID 19 guidelines implemented in PV today with Pt and RN  Pt is fully vaccinated for Covid  Coupon given to pt in PV today and NO PA's for preps discussed with pt in PV today  Discussed with pt there will be an out-of-pocket cost for prep and that varies from $0 to 70 dollars  Due to the COVID-19 pandemic we are asking patients to follow certain guidelines.  Pt aware of COVID protocols and [facility] guidelines

## 2020-05-13 NOTE — Telephone Encounter (Signed)
Suprep is $137.00.  Spoke with pharmacy, who state they do not have generic Moviprep but have Golytely.  Golytely and Miralax prep offered to pt- he chooses the MIralax prep.  New instructions mailed out to pt.

## 2020-05-13 NOTE — Telephone Encounter (Signed)
Patient called to advise the prep medication is not covered by insurance please send an alternative and call patient once done so he knows to pick it up.

## 2020-05-19 ENCOUNTER — Encounter: Payer: Self-pay | Admitting: Gastroenterology

## 2020-05-20 ENCOUNTER — Encounter: Payer: BC Managed Care – PPO | Admitting: Family Medicine

## 2020-05-25 ENCOUNTER — Ambulatory Visit (AMBULATORY_SURGERY_CENTER): Payer: BC Managed Care – PPO | Admitting: Gastroenterology

## 2020-05-25 ENCOUNTER — Other Ambulatory Visit: Payer: Self-pay

## 2020-05-25 ENCOUNTER — Encounter: Payer: Self-pay | Admitting: Gastroenterology

## 2020-05-25 VITALS — BP 107/79 | HR 60 | Temp 98.0°F | Resp 13 | Ht 74.0 in | Wt 258.0 lb

## 2020-05-25 DIAGNOSIS — Z1211 Encounter for screening for malignant neoplasm of colon: Secondary | ICD-10-CM | POA: Diagnosis not present

## 2020-05-25 DIAGNOSIS — Z86010 Personal history of colonic polyps: Secondary | ICD-10-CM

## 2020-05-25 MED ORDER — SODIUM CHLORIDE 0.9 % IV SOLN: 500.0000 mL | Freq: Once | INTRAVENOUS | Status: DC

## 2020-05-25 NOTE — Op Note (Addendum)
[facility] Patient Name: [name] Procedure Date: 05/25/2020 9:58 AM MRN: [id number] Endoscopist: Rachael Fee , MD Age: 65 Referring MD:  Date of Birth: 1956-01-07 Gender: Male Account #: [id number] Procedure:                Colonoscopy Indications:              Screening for colorectal malignant neoplasm Medicines:                Monitored Anesthesia Care Procedure:                Pre-Anesthesia Assessment:                           - Prior to the procedure, a History and Physical                            was performed, and patient medications and                            allergies were reviewed. The patient's tolerance of                            previous anesthesia was also reviewed. The risks                            and benefits of the procedure and the sedation                            options and risks were discussed with the patient.                            All questions were answered, and informed consent                            was obtained. Prior Anticoagulants: The patient has                            taken no previous anticoagulant or antiplatelet                            agents. ASA Grade Assessment: II - A patient with                            mild systemic disease. After reviewing the risks                            and benefits, the patient was deemed in                            satisfactory condition to undergo the procedure.                           After obtaining informed consent, the colonoscope  was passed under direct vision. Throughout the                            procedure, the patient's blood pressure, pulse, and                            oxygen saturations were monitored continuously. The                            Olympus [id number]) [id number] was introduced                            through the anus and advanced to the the cecum,                            identified by  appendiceal orifice and ileocecal                            valve. The colonoscopy was performed without                            difficulty. The patient tolerated the procedure                            well. The quality of the bowel preparation was                            good. The ileocecal valve, appendiceal orifice, and                            rectum were photographed. Scope In: 10:07:19 AM Scope Out: 10:18:07 AM Scope Withdrawal Time: 0 hours 8 minutes 34 seconds  Total Procedure Duration: 0 hours 10 minutes 48 seconds  Findings:                 The entire examined colon appeared normal on direct                            and retroflexion views. Complications:            No immediate complications. Estimated blood loss:                            None. Estimated Blood Loss:     Estimated blood loss: none. Impression:               - The entire examined colon is normal on direct and                            retroflexion views.                           - No polyps or cancers. Recommendation:           - Patient has a contact number available for  emergencies. The signs and symptoms of potential                            delayed complications were discussed with the                            patient. Return to normal activities tomorrow.                            Written discharge instructions were provided to the                            patient.                           - Resume previous diet.                           - Continue present medications.                           - Repeat colonoscopy in 10 years for screening. Rachael Fee, MD 05/25/2020 10:20:05 AM This report has been signed electronically.

## 2020-05-25 NOTE — Patient Instructions (Signed)
NO POLYPS!!!  Resume previous diet Continue current medications Await pathology results YOU HAD AN ENDOSCOPIC PROCEDURE TODAY AT THE [facility]:   Refer to the procedure report that was given to you for any specific questions about what was found during the examination.  If the procedure report does not answer your questions, please call your gastroenterologist to clarify.  If you requested that your care partner not be given the details of your procedure findings, then the procedure report has been included in a sealed envelope for you to review at your convenience later.  YOU SHOULD EXPECT: Some feelings of bloating in the abdomen. Passage of more gas than usual.  Walking can help get rid of the air that was put into your GI tract during the procedure and reduce the bloating. If you had a lower endoscopy (such as a colonoscopy or flexible sigmoidoscopy) you may notice spotting of blood in your stool or on the toilet paper. If you underwent a bowel prep for your procedure, you may not have a normal bowel movement for a few days.  Please Note:  You might notice some irritation and congestion in your nose or some drainage.  This is from the oxygen used during your procedure.  There is no need for concern and it should clear up in a day or so.  SYMPTOMS TO REPORT IMMEDIATELY:   Following lower endoscopy (colonoscopy or flexible sigmoidoscopy):  Excessive amounts of blood in the stool  Significant tenderness or worsening of abdominal pains  Swelling of the abdomen that is new, acute  Fever of 100F or higher For urgent or emergent issues, a gastroenterologist can be reached at any hour by calling [phone]. Do not use MyChart messaging for urgent concerns.   DIET:  We do recommend a small meal at first, but then you may proceed to your regular diet.  Drink plenty of fluids but you should avoid alcoholic beverages for 24 hours.  ACTIVITY:  You should plan to take it easy for  the rest of today and you should NOT DRIVE or use heavy machinery until tomorrow (because of the sedation medicines used during the test).    FOLLOW UP: Our staff will call the number listed on your records 48-72 hours following your procedure to check on you and address any questions or concerns that you may have regarding the information given to you following your procedure. If we do not reach you, we will leave a message.  We will attempt to reach you two times.  During this call, we will ask if you have developed any symptoms of COVID 19. If you develop any symptoms (ie: fever, flu-like symptoms, shortness of breath, cough etc.) before then, please call [phone].  If you test positive for Covid 19 in the 2 weeks post procedure, please call and report this information to [place].    If any biopsies were taken you will be contacted by phone or by letter within the next 1-3 weeks.  Please call us at [phone] if you have not heard about the biopsies in 3 weeks.   SIGNATURES/CONFIDENTIALITY: You and/or your care partner have signed paperwork which will be entered into your electronic medical record.  These signatures attest to the fact that that the information above on your After Visit Summary has been reviewed and is understood.  Full responsibility of the confidentiality of this discharge information lies with you and/or your care-partner.

## 2020-05-25 NOTE — Progress Notes (Signed)
pt tolerated well. VSS. awake and to recovery. Report given to RN.  

## 2020-05-25 NOTE — Progress Notes (Signed)
VS by CW  Pt's states no medical or surgical changes since previsit or office visit.  

## 2020-05-27 ENCOUNTER — Telehealth: Payer: Self-pay

## 2020-05-27 DIAGNOSIS — E785 Hyperlipidemia, unspecified: Secondary | ICD-10-CM | POA: Diagnosis not present

## 2020-05-27 DIAGNOSIS — Z125 Encounter for screening for malignant neoplasm of prostate: Secondary | ICD-10-CM | POA: Diagnosis not present

## 2020-05-27 DIAGNOSIS — Z0000 Encounter for general adult medical examination without abnormal findings: Secondary | ICD-10-CM | POA: Diagnosis not present

## 2020-05-27 NOTE — Telephone Encounter (Signed)
Second follow up call attempt, no answer, unable to LM 

## 2020-05-27 NOTE — Telephone Encounter (Signed)
NO ANSWER, MESSAGE LEFT FOR PATIENT. 

## 2020-05-28 LAB — COMPREHENSIVE METABOLIC PANEL
ALT: 24 IU/L (ref 0–44)
AST: 22 IU/L (ref 0–40)
Albumin/Globulin Ratio: 2.1 (ref 1.2–2.2)
Albumin: 4.4 g/dL (ref 3.8–4.8)
Alkaline Phosphatase: 59 IU/L (ref 44–121)
BUN/Creatinine Ratio: 16 (ref 10–24)
BUN: 17 mg/dL (ref 8–27)
Bilirubin Total: 0.3 mg/dL (ref 0.0–1.2)
CO2: 21 mmol/L (ref 20–29)
Calcium: 9.3 mg/dL (ref 8.6–10.2)
Chloride: 109 mmol/L — ABNORMAL HIGH (ref 96–106)
Creatinine, Ser: 1.07 mg/dL (ref 0.76–1.27)
Globulin, Total: 2.1 g/dL (ref 1.5–4.5)
Glucose: 99 mg/dL (ref 65–99)
Potassium: 3.8 mmol/L (ref 3.5–5.2)
Sodium: 144 mmol/L (ref 134–144)
Total Protein: 6.5 g/dL (ref 6.0–8.5)
eGFR: 78 mL/min/{1.73_m2} (ref >59)

## 2020-05-28 LAB — LIPID PANEL
Chol/HDL Ratio: 4.3 ratio (ref 0.0–5.0)
Cholesterol, Total: 136 mg/dL (ref 100–199)
HDL: 32 mg/dL — ABNORMAL LOW (ref >39)
LDL Chol Calc (NIH): 83 mg/dL (ref 0–99)
Triglycerides: 112 mg/dL (ref 0–149)
VLDL Cholesterol Cal: 21 mg/dL (ref 5–40)

## 2020-05-28 LAB — PSA: Prostate Specific Ag, Serum: 1.3 ng/mL (ref 0.0–4.0)

## 2020-06-03 ENCOUNTER — Other Ambulatory Visit: Payer: Self-pay

## 2020-06-03 ENCOUNTER — Encounter: Payer: Self-pay | Admitting: Family Medicine

## 2020-06-03 ENCOUNTER — Ambulatory Visit (INDEPENDENT_AMBULATORY_CARE_PROVIDER_SITE_OTHER): Payer: BC Managed Care – PPO | Admitting: Family Medicine

## 2020-06-03 VITALS — BP 134/88 | Temp 97.3°F | Ht 71.75 in | Wt 255.6 lb

## 2020-06-03 DIAGNOSIS — Z0000 Encounter for general adult medical examination without abnormal findings: Secondary | ICD-10-CM | POA: Diagnosis not present

## 2020-06-03 NOTE — Patient Instructions (Signed)
Results for orders placed or performed in visit on 03/04/20  Lipid panel  Result Value Ref Range   Cholesterol, Total 136 100 - 199 mg/dL   Triglycerides 112 0 - 149 mg/dL   HDL 32 (L) >39 mg/dL   VLDL Cholesterol Cal 21 5 - 40 mg/dL   LDL Chol Calc (NIH) 83 0 - 99 mg/dL   Chol/HDL Ratio 4.3 0.0 - 5.0 ratio  Comprehensive metabolic panel  Result Value Ref Range   Glucose 99 65 - 99 mg/dL   BUN 17 8 - 27 mg/dL   Creatinine, Ser 1.07 0.76 - 1.27 mg/dL   eGFR 78 >59 mL/min/1.73   BUN/Creatinine Ratio 16 10 - 24   Sodium 144 134 - 144 mmol/L   Potassium 3.8 3.5 - 5.2 mmol/L   Chloride 109 (H) 96 - 106 mmol/L   CO2 21 20 - 29 mmol/L   Calcium 9.3 8.6 - 10.2 mg/dL   Total Protein 6.5 6.0 - 8.5 g/dL   Albumin 4.4 3.8 - 4.8 g/dL   Globulin, Total 2.1 1.5 - 4.5 g/dL   Albumin/Globulin Ratio 2.1 1.2 - 2.2   Bilirubin Total 0.3 0.0 - 1.2 mg/dL   Alkaline Phosphatase 59 44 - 121 IU/L   AST 22 0 - 40 IU/L   ALT 24 0 - 44 IU/L  PSA  Result Value Ref Range   Prostate Specific Ag, Serum 1.3 0.0 - 4.0 ng/mL     Shingrix and shingles prevention: know the facts!   Shingrix is a very effective vaccine to prevent shingles.   Shingles is a reactivation of chickenpox -more than 99% of Americans born before 1980 have had chickenpox even if they do not remember it. One in every 10 people who get shingles have severe long-lasting nerve pain as a result.   33 out of a 100 older adults will get shingles if they are unvaccinated.     This vaccine is very important for your health This vaccine is indicated for anyone 50 years or older. You can get this vaccine even if you have already had shingles because you can get the disease more than once in a lifetime.  Your risk for shingles and its complications increases with age.  This vaccine has 2 doses.  The second dose would be 2 to 6 months after the first dose.  If you had Zostavax vaccine in the past you should still get Shingrix. ( Zostavax is  only 70% effective and it loses significant strength over a few years .)  This vaccine is given through the pharmacy.  The cost of the vaccine is through your insurance. The pharmacy can inform you of the total costs.  Common side effects including soreness in the arm, some redness and swelling, also some feel fatigue muscle soreness headache low-grade fever.  Side effects typically go away within 2 to 3 days. Remember-the pain from shingles can last a lifetime but these side effects of the vaccine will only last a few days at most. It is very important to get both doses in order to protect yourself fully.   Please get this vaccine at your earliest convenience at your trusted pharmacy.       

## 2020-06-03 NOTE — Progress Notes (Signed)
   Subjective:    Patient ID: [name], male    DOB: [birth year], 65 y.o.   MRN: [id number]  HPI The patient comes in today for a wellness visit.  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  A review of medications was also completed.  Any needed refills; omeprazole   Eating habits: healthy eating   Falls/  MVA accidents in past few months: none  Regular exercise: work-walking/lifting; weight occasionally   Specialist pt sees on regular basis: Cardiology once per year  Preventative health issues were discussed.   Additional concerns: none   Review of Systems  Constitutional: Negative for activity change, fatigue and fever.  HENT: Negative for congestion and rhinorrhea.   Respiratory: Negative for cough and shortness of breath.   Cardiovascular: Negative for chest pain and leg swelling.  Gastrointestinal: Negative for abdominal pain, diarrhea and nausea.  Genitourinary: Negative for dysuria and hematuria.  Neurological: Negative for weakness and headaches.  Psychiatric/Behavioral: Negative for agitation and behavioral problems.       Objective:   Physical Exam Vitals reviewed.  Constitutional:      General: He is not in acute distress. HENT:     Head: Normocephalic and atraumatic.  Eyes:     General:        Right eye: No discharge.        Left eye: No discharge.  Neck:     Trachea: No tracheal deviation.  Cardiovascular:     Rate and Rhythm: Normal rate and regular rhythm.     Heart sounds: Normal heart sounds. No murmur heard.   Pulmonary:      Effort: Pulmonary effort is normal. No respiratory distress.     Breath sounds: Normal breath sounds.  Lymphadenopathy:     Cervical: No cervical adenopathy.  Skin:    General: Skin is warm and dry.  Neurological:     Mental Status: He is alert.     Coordination: Coordination normal.  Psychiatric:        Behavior: Behavior normal.    Prostate slightly enlarged soft no hard nodules       Assessment & Plan:  Adult wellness-complete.wellness physical was conducted today. Importance of diet and exercise were discussed in detail.  In addition to this a discussion regarding safety was also covered. We also reviewed over immunizations and gave recommendations regarding current immunization needed for age.  In addition to this additional areas were also touched on including: Preventative health exams needed:  Colonoscopy up-to-date Prostate up-to-date blood work looks good Cholesterol profile looks good. Follow-up with cardiology yearly basis PSA looks good Shingrix vaccine recommended Wellness exam in 1 year Healthy eating portion control physical activity and trying to lose a little bit of weight was discussed Patient was advised yearly wellness exam

## 2020-07-08 ENCOUNTER — Other Ambulatory Visit: Payer: Self-pay | Admitting: Cardiovascular Disease

## 2020-08-04 ENCOUNTER — Other Ambulatory Visit: Payer: Self-pay | Admitting: Cardiovascular Disease

## 2020-08-11 ENCOUNTER — Ambulatory Visit (INDEPENDENT_AMBULATORY_CARE_PROVIDER_SITE_OTHER): Payer: BC Managed Care – PPO

## 2020-08-11 DIAGNOSIS — Z950 Presence of cardiac pacemaker: Secondary | ICD-10-CM

## 2020-08-11 LAB — CUP PACEART REMOTE DEVICE CHECK
Battery Voltage: 2.83 V
Brady Statistic AP VP Percent: 0 %
Brady Statistic AP VS Percent: 50.67 %
Brady Statistic AS VP Percent: 0 %
Brady Statistic AS VS Percent: 49.33 %
Brady Statistic RA Percent Paced: 50.63 %
Brady Statistic RV Percent Paced: 0 %
Date Time Interrogation Session: 20220705131039
Implantable Lead Implant Date: 20120501
Implantable Lead Implant Date: 20120501
Implantable Lead Location: 753859
Implantable Lead Location: 753860
Implantable Pulse Generator Implant Date: 20120501
Lead Channel Impedance Value: 376 Ohm
Lead Channel Impedance Value: 472 Ohm
Lead Channel Sensing Intrinsic Amplitude: 1.478 mV
Lead Channel Sensing Intrinsic Amplitude: 14.897 mV
Lead Channel Setting Pacing Amplitude: 2 V
Lead Channel Setting Pacing Amplitude: 2.5 V
Lead Channel Setting Pacing Pulse Width: 0.4 ms
Lead Channel Setting Sensing Sensitivity: 0.9 mV

## 2020-08-26 DIAGNOSIS — M25559 Pain in unspecified hip: Secondary | ICD-10-CM | POA: Diagnosis not present

## 2020-08-26 DIAGNOSIS — M545 Low back pain, unspecified: Secondary | ICD-10-CM | POA: Diagnosis not present

## 2020-08-26 DIAGNOSIS — G8929 Other chronic pain: Secondary | ICD-10-CM | POA: Diagnosis not present

## 2020-08-26 DIAGNOSIS — Z6832 Body mass index (BMI) 32.0-32.9, adult: Secondary | ICD-10-CM | POA: Diagnosis not present

## 2020-08-31 DIAGNOSIS — M5116 Intervertebral disc disorders with radiculopathy, lumbar region: Secondary | ICD-10-CM | POA: Diagnosis not present

## 2020-08-31 DIAGNOSIS — M5416 Radiculopathy, lumbar region: Secondary | ICD-10-CM | POA: Diagnosis not present

## 2020-09-01 NOTE — Progress Notes (Signed)
Remote pacemaker transmission.   

## 2020-10-26 ENCOUNTER — Encounter: Payer: Self-pay | Admitting: Cardiovascular Disease

## 2020-10-26 ENCOUNTER — Ambulatory Visit (INDEPENDENT_AMBULATORY_CARE_PROVIDER_SITE_OTHER): Payer: BC Managed Care – PPO | Admitting: Cardiovascular Disease

## 2020-10-26 ENCOUNTER — Other Ambulatory Visit: Payer: Self-pay

## 2020-10-26 VITALS — BP 152/96 | HR 62 | Ht 73.0 in | Wt 252.2 lb

## 2020-10-26 DIAGNOSIS — R55 Syncope and collapse: Secondary | ICD-10-CM

## 2020-10-26 DIAGNOSIS — E785 Hyperlipidemia, unspecified: Secondary | ICD-10-CM

## 2020-10-26 DIAGNOSIS — Z950 Presence of cardiac pacemaker: Secondary | ICD-10-CM

## 2020-10-26 DIAGNOSIS — I10 Essential (primary) hypertension: Secondary | ICD-10-CM | POA: Diagnosis not present

## 2020-10-26 DIAGNOSIS — E669 Obesity, unspecified: Secondary | ICD-10-CM | POA: Diagnosis not present

## 2020-10-26 NOTE — Patient Instructions (Signed)
Medication Instructions:  No changes *If you need a refill on your cardiac medications before your next appointment, please call your pharmacy*   Lab Work: None ordered If you have labs (blood work) drawn today and your tests are completely normal, you will receive your results only by: MyChart Message (if you have MyChart) OR A paper copy in the mail If you have any lab test that is abnormal or we need to change your treatment, we will call you to review the results.   Testing/Procedures: None ordered   Follow-Up: At [facility], you and your health needs are our priority.  As part of our continuing mission to provide you with exceptional heart care, we have created designated Provider Care Teams.  These Care Teams include your primary Cardiologist (physician) and Advanced Practice Providers (APPs -  Physician Assistants and Nurse Practitioners) who all work together to provide you with the care you need, when you need it.  We recommend signing up for the patient portal called "MyChart".  Sign up information is provided on this After Visit Summary.  MyChart is used to connect with patients for Virtual Visits (Telemedicine).  Patients are able to view lab/test results, encounter notes, upcoming appointments, etc.  Non-urgent messages can be sent to your provider as well.   To learn more about what you can do with MyChart, go to [email or web address].    Your next appointment:   12 month(s)  The format for your next appointment:   In Person  Provider:   Thurmon Fair, MD  Dr. Royann Shivers would like you to check your blood pressure daily for the next week.  Keep a journal of these daily blood pressure and heart rate readings and call our office or send a message through MyChart with the results. Thank you!  It is best to check your BP 1-2 hours after taking your medications to see the medications effectiveness on your BP.    Here are some tips that our clinical pharmacists share  for home BP monitoring:          Rest 10 minutes before taking your blood pressure.          Don't smoke or drink caffeinated beverages for at least 30 minutes before.          Take your blood pressure before (not after) you eat.          Sit comfortably with your back supported and both feet on the floor (don't cross your legs).          Elevate your arm to heart level on a table or a desk.          Use the proper sized cuff. It should fit smoothly and snugly around your bare upper arm. There should be enough room to slip a fingertip under the cuff. The bottom edge of the cuff should be 1 inch above the crease of the elbow.

## 2020-10-26 NOTE — Progress Notes (Signed)
Cardiology Office Note    Date:  10/26/2020   ID:  COUPER JUNCAJ, DOB Apr 02, 1955, MRN [id number]  PCP:  Babs Sciara, MD  Cardiologist:   Thurmon Fair, MD   Chief Complaint  Patient presents with   Pacemaker Check     History of Present Illness:  [name] is a 65 y.o. 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    [name] is doing quite well.  He has not had any cardiovascular complaints.  He is having problems with back and knee pain and believes that is why his blood pressure is high today.  He has not had any syncopal or near syncopal events.  He is no longer working the night shift over the last roughly 10 months and his overall workload has diminished.  He is still moving around 3-4 hours a day according to his pacemaker.  He denies lower extremity edema, orthopnea, PND or exertional dyspnea.  He has not had any chest pain either at rest or with activity.  He denies palpitations, claudication or focal neurological events.  His device is approaching ERI, but otherwise is functioning normally.  Medtronic Revo dual-chamber pacemaker was implanted in 2012 and has a battery voltage of 2.83 V (ERI 2.81 V).  He has about 50% atrial pacing and never requires ventricular pacing.  There has been no atrial fibrillation no ventricular tachycardia.  He did have 1 episode of high ventricular rate which is actually paroxysmal atrial tachycardia, lasting for about 10 beats.  Several episodes of AV associated fast rates are likely sinus tachycardia.  They have occurred during work hours..  On his most recent lipid profile from April, he continues to have a low HDL at 32, but otherwise his lipid profile is acceptable and he does not have diabetes  mellitus.  Past Medical History:  Diagnosis Date   Allergy    seasonal allergies   Arthritis    LEFT shoulder, RIGHT elbow   Barrett's esophagus    GERD (gastroesophageal reflux disease)    on meds   Hypercholesterolemia    triglycerides elevated-on meds   Hypotension    Pacemaker    Syncope     Past Surgical History:  Procedure Laterality Date   birthmark removal     right thigh   BLADDER SURGERY     stretched   CARDIAC CATHETERIZATION  09/15/2006   Recommendation - empiric anti-reflux therapy   CARDIOVASCULAR STRESS TEST  04/08/2011   No scintigraphic evidence of inducible myocardial ischemia. No lexiscan EKG changes. Non-diagnostic for ischemia.   CERVICAL SPINE SURGERY     plates and screws   COLONOSCOPY  2011   DJ-F/V-moviprep(exc)TICS/HPP   LOWER EXTREMITY ARTERIAL DOPPLER  10/02/2006   No evidence of thrombus or thrombphlebitis.   PACEMAKER INSERTION  06/08/2010   Medtronic Revo model #RVDR01, serial [id number]   POLYPECTOMY  2011   HPP   TONSILLECTOMY     TRANSTHORACIC ECHOCARDIOGRAM  04/06/2011   EF 55-60%, normal   UPPER GASTROINTESTINAL ENDOSCOPY     WISDOM TOOTH EXTRACTION      Current Medications: Outpatient Medications Prior to Visit  Medication Sig Dispense Refill   aspirin EC 81 MG tablet Take 81 mg by mouth daily.     fenofibrate (TRICOR) 145 MG tablet TAKE 1 TABLET (145 MG  TOTAL) BY MOUTH DAILY. KEEP OFFICE VISIT 30 tablet 1   Multiple Vitamin (MULTIVITAMIN) tablet Take 1 tablet by mouth daily.     omeprazole (PRILOSEC) 20 MG capsule TAKE 1 CAPSULE BY MOUTH EVERY DAY (Patient taking differently: daily as needed.) 90 capsule 3   Pseudoephedrine HCl (SUDAFED 12 HOUR PO) Take 1 tablet by mouth daily as needed.     Naphazoline HCl (CLEAR EYES OP) Place 2 drops into both eyes daily as needed (for dry eyes). (Patient not taking: Reported on 10/26/2020)     Facility-Administered Medications Prior to Visit  Medication Dose Route Frequency Provider Last Rate  Last Admin   0.9 %  sodium chloride infusion  500 mL Intravenous Once Rachael Fee, MD         Allergies:   Other, Norvasc [amlodipine besylate], Levofloxacin, and Sulfonamide derivatives   Social History   Socioeconomic History   Marital status: Married    Spouse name: Not on file   Number of children: 2   Years of education: Not on file   Highest education level: Not on file  Occupational History   Occupation: Education officer, museum, Estate agent, 3rd shift    Employer: [employer]  Tobacco Use   Smoking status: Former    Types: Cigarettes    Quit date: 11/27/1973    Years since quitting: 46.9   Smokeless tobacco: Never  Vaping Use   Vaping Use: Never used  Substance and Sexual Activity   Alcohol use: No   Drug use: No   Sexual activity: Not on file  Other Topics Concern   Not on file  Social History Narrative   Lives with wife, who is a Engineer, civil (consulting).   Social Determinants of Health   Financial Resource Strain: Not on file  Food Insecurity: Not on file  Transportation Needs: Not on file  Physical Activity: Not on file  Stress: Not on file  Social Connections: Not on file     Family History:  The patient's family history includes COPD in his sister; Heart attack in his father and sister; Heart failure in his father; Hyperlipidemia in his brother and sister; Hypertension in his brother; Lymphoma in his brother; Ovarian cancer in his mother; Throat cancer (age of onset: 66) in his maternal uncle.   ROS:   Please see the history of present illness.   All other systems are reviewed and are negative.   PHYSICAL EXAM:   VS:  BP (!) 152/96 (BP Location: Left Arm, Patient Position: Sitting, Cuff Size: Large)   Pulse 62   Ht 6\' 1"  (1.854 m)   Wt 252 lb 3.2 oz (114.4 kg)   SpO2 97%   BMI 33.27 kg/m     General: Alert, oriented x3, no distress, mildly obese.  Healthy left subclavian pacemaker site. Head: no evidence of trauma, PERRL, EOMI, no exophtalmos or lid lag, no  myxedema, no xanthelasma; normal ears, nose and oropharynx Neck: normal jugular venous pulsations and no hepatojugular reflux; brisk carotid pulses without delay and no carotid bruits Chest: clear to auscultation, no signs of consolidation by percussion or palpation, normal fremitus, symmetrical and full respiratory excursions Cardiovascular: normal position and quality of the apical impulse, regular rhythm, normal first and second heart sounds, no murmurs, rubs or gallops Abdomen: no tenderness or distention, no masses by palpation, no abnormal pulsatility or arterial bruits, normal bowel sounds, no hepatosplenomegaly Extremities: no clubbing, cyanosis or edema; 2+ radial, ulnar and brachial pulses bilaterally; 2+ right femoral, posterior tibial  and dorsalis pedis pulses; 2+ left femoral, posterior tibial and dorsalis pedis pulses; no subclavian or femoral bruits Neurological: grossly nonfocal Psych: Normal mood and affect    Wt Readings from Last 3 Encounters:  10/26/20 252 lb 3.2 oz (114.4 kg)  06/03/20 255 lb 9.6 oz (115.9 kg)  05/25/20 258 lb (117 kg)      Studies/Labs Reviewed:   EKG:  EKG is ordered today.  It shows atrial paced, ventricular sensed rhythm with a mildly prolonged QRS at 114 ms (incomplete right bundle branch block).  QTc 309 9 ms.  Comprehensive pacemaker check in the office today shows normal device function, battery approaching RRT (2.83 V versus RRT 2.81 V).  Recent Labs: 05/27/2020: ALT 24; BUN 17; Creatinine, Ser 1.07; Potassium 3.8; Sodium 144   Lipid Panel    Component Value Date/Time   CHOL 136 05/27/2020 0856   TRIG 112 05/27/2020 0856   HDL 32 (L) 05/27/2020 0856   CHOLHDL 4.3 05/27/2020 0856   CHOLHDL 5.3 (H) 05/31/2018 0944   VLDL 30 12/28/2015 0827   LDLCALC 83 05/27/2020 0856   LDLCALC 101 (H) 05/31/2018 0944      ASSESSMENT:    1. Neurocardiogenic syncope   2. Dyslipidemia   3. Mild obesity   4. Pacemaker   5. Essential hypertension       PLAN:  In order of problems listed above:  Neurocardiogenic syncope: He has not had syncope since his pacemaker was implanted.  He denies any recent symptoms of near syncope. HLP: Low HDL.  This will not improve without weight loss. Obesity: No improvement since last year.  Reviewed importance of low calorie/low carbohydrate diet rich in lean protein and unsaturated fat, reduce quantities of sugars, starches with high glycemic index and saturated fat. PPM: Normal device function, anticipate ERI probably in the next 3 months or so. HTN: His blood pressure is again elevated today.  He blames it on pain.  I am not so sure since this is the second visit in a row where his blood pressure has been high.  In April he does have a list of blood pressure is 134/88.  I asked him to keep a record of his blood pressure at home for a week and send it to me before we make any changes to his medications.  If we decide to start antihypertensive medications, avoid potent vasodilators and diuretics, which could worsen his tendency to have syncope.   Medication Adjustments/Labs and Tests Ordered: Current medicines are reviewed at length with the patient today.  Concerns regarding medicines are outlined above.  Medication changes, Labs and Tests ordered today are listed in the Patient Instructions below. Patient Instructions  Medication Instructions:  No changes *If you need a refill on your cardiac medications before your next appointment, please call your pharmacy*   Lab Work: None ordered If you have labs (blood work) drawn today and your tests are completely normal, you will receive your results only by: MyChart Message (if you have MyChart) OR A paper copy in the mail If you have any lab test that is abnormal or we need to change your treatment, we will call you to review the results.   Testing/Procedures: None ordered   Follow-Up: At [facility], you and your health needs are our  priority.  As part of our continuing mission to provide you with exceptional heart care, we have created designated Provider Care Teams.  These Care Teams include your primary Cardiologist (physician) and Advanced Practice  Providers (APPs -  Physician Assistants and Nurse Practitioners) who all work together to provide you with the care you need, when you need it.  We recommend signing up for the patient portal called "MyChart".  Sign up information is provided on this After Visit Summary.  MyChart is used to connect with patients for Virtual Visits (Telemedicine).  Patients are able to view lab/test results, encounter notes, upcoming appointments, etc.  Non-urgent messages can be sent to your provider as well.   To learn more about what you can do with MyChart, go to [email or web address].    Your next appointment:   12 month(s)  The format for your next appointment:   In Person  Provider:   Thurmon Fair, MD  Dr. Royann Shivers would like you to check your blood pressure daily for the next week.  Keep a journal of these daily blood pressure and heart rate readings and call our office or send a message through MyChart with the results. Thank you!  It is best to check your BP 1-2 hours after taking your medications to see the medications effectiveness on your BP.    Here are some tips that our clinical pharmacists share for home BP monitoring:          Rest 10 minutes before taking your blood pressure.          Don't smoke or drink caffeinated beverages for at least 30 minutes before.          Take your blood pressure before (not after) you eat.          Sit comfortably with your back supported and both feet on the floor (don't cross your legs).          Elevate your arm to heart level on a table or a desk.          Use the proper sized cuff. It should fit smoothly and snugly around your bare upper arm. There should be enough room to slip a fingertip under the cuff. The bottom edge of the  cuff should be 1 inch above the crease of the elbow.    Signed, Thurmon Fair, MD  10/26/2020 3:09 PM    [facility] [address] [place], [place], [place]  [address] Phone: [phone]; Fax: [phone]

## 2020-10-29 ENCOUNTER — Other Ambulatory Visit: Payer: Self-pay | Admitting: Orthopedic Surgery

## 2020-10-29 ENCOUNTER — Encounter: Payer: Self-pay | Admitting: Orthopedic Surgery

## 2020-10-29 ENCOUNTER — Other Ambulatory Visit: Payer: Self-pay

## 2020-10-29 ENCOUNTER — Ambulatory Visit: Payer: BC Managed Care – PPO

## 2020-10-29 ENCOUNTER — Ambulatory Visit (INDEPENDENT_AMBULATORY_CARE_PROVIDER_SITE_OTHER): Payer: BC Managed Care – PPO | Admitting: Orthopedic Surgery

## 2020-10-29 VITALS — BP 134/92 | HR 87 | Ht 73.0 in | Wt 246.5 lb

## 2020-10-29 DIAGNOSIS — M7521 Bicipital tendinitis, right shoulder: Secondary | ICD-10-CM

## 2020-10-29 DIAGNOSIS — G8929 Other chronic pain: Secondary | ICD-10-CM | POA: Diagnosis not present

## 2020-10-29 DIAGNOSIS — M25511 Pain in right shoulder: Secondary | ICD-10-CM

## 2020-10-29 MED ORDER — DICLOFENAC SODIUM 75 MG PO TBEC
75.0000 mg | DELAYED_RELEASE_TABLET | Freq: Two times a day (BID) | ORAL | 2 refills | Status: DC
Start: 2020-10-29 — End: 2021-05-31

## 2020-10-29 NOTE — Patient Instructions (Addendum)
Rest 2 weeks   Out of work 2 weeks   Ice pack 30 min 2 x a day   Start diclofenac twice a day

## 2020-10-29 NOTE — Progress Notes (Signed)
Chief Complaint  Patient presents with   Arm Pain    R/ hurts but not all the time. The pain stays in the upper arm.   History this is a 65 year old male who works driving a Chief Executive Officer and has some interesting activities at his job which may exacerbate the issues he is having with his right shoulder he complains of pain in the front of the shoulder along the biceps tendon anterior joint line he says he gets tired easily  He reports that both arms will get tired especially with overhead activity  He status post C5-7 cervical fusion years ago  He denies any neck pain  No injury to the right shoulder  Exam  General appearance normal mesomorphic body habitus  Cardiovascular exam normal pulse and perfusion upper extremities  Neurologic exam normal sensation and position sense  Skin warm dry intact no rash or erythema  Psychiatric exam normal mood and affect  He is alert and oriented x3  Musculoskeletal findings.  Normal contour of both shoulders full range of motion both shoulders  The right shoulder has a positive Speed test a negative Yergason test 5 out of 5 strength in his rotator cuff no impingement  Internal images showed normal right shoulder X-ray cervical spine shows C5 C7 solid fusion with degenerative changes above the fusion anterior osteophytes are seen with open disc space   Assessment and plan vague symptoms right shoulder and is 65 year old male possibly has biceps pathology  We discussed injection versus oral medication and decided to go with the oral medication for now along with 2 weeks of rest from work and reexamination at that time   Meds ordered this encounter  Medications   diclofenac (VOLTAREN) 75 MG EC tablet    Sig: Take 1 tablet (75 mg total) by mouth 2 (two) times daily with a meal.    Dispense:  60 tablet    Refill:  2

## 2020-10-30 ENCOUNTER — Encounter: Payer: Self-pay | Admitting: Orthopedic Surgery

## 2020-11-06 ENCOUNTER — Other Ambulatory Visit (HOSPITAL_COMMUNITY): Payer: Self-pay | Admitting: Neurological Surgery

## 2020-11-06 DIAGNOSIS — G8929 Other chronic pain: Secondary | ICD-10-CM

## 2020-11-11 ENCOUNTER — Encounter: Payer: Self-pay | Admitting: Orthopedic Surgery

## 2020-11-11 ENCOUNTER — Ambulatory Visit (INDEPENDENT_AMBULATORY_CARE_PROVIDER_SITE_OTHER): Payer: BC Managed Care – PPO | Admitting: Orthopedic Surgery

## 2020-11-11 ENCOUNTER — Other Ambulatory Visit: Payer: Self-pay

## 2020-11-11 VITALS — BP 152/100 | HR 63 | Ht 73.0 in | Wt 253.0 lb

## 2020-11-11 DIAGNOSIS — M7521 Bicipital tendinitis, right shoulder: Secondary | ICD-10-CM | POA: Diagnosis not present

## 2020-11-11 NOTE — Patient Instructions (Signed)
CONTINUE DICLOFENAC

## 2020-11-11 NOTE — Progress Notes (Signed)
Chief Complaint  Patient presents with   Shoulder Pain    Right/ still has pain "at times"    Encounter Diagnosis  Name Primary?   Biceps tendinitis on right Yes   [name] has improved with rest and diclofenac regarding his right shoulder he has some intermittent symptoms but he says he is better  He is actually having some lower back pain at this time  Reexamination of the right shoulder  He has full range of motion he has some tenderness over the proximal biceps tendon but his Speed sign was negative  I am releasing him he can stay on his Voltaren for 30 days if he has any other troubles he can call me back

## 2020-11-13 ENCOUNTER — Ambulatory Visit (HOSPITAL_COMMUNITY)
Admission: RE | Admit: 2020-11-13 | Discharge: 2020-11-13 | Disposition: A | Discharge status: Home / Self Care | Payer: BC Managed Care – PPO | Source: Ambulatory Visit | Attending: Neurological Surgery | Admitting: Neurological Surgery

## 2020-11-13 ENCOUNTER — Other Ambulatory Visit: Payer: Self-pay | Admitting: Cardiovascular Disease

## 2020-11-13 ENCOUNTER — Other Ambulatory Visit: Payer: Self-pay

## 2020-11-13 DIAGNOSIS — M545 Low back pain, unspecified: Secondary | ICD-10-CM | POA: Insufficient documentation

## 2020-11-13 DIAGNOSIS — G8929 Other chronic pain: Secondary | ICD-10-CM | POA: Insufficient documentation

## 2020-11-13 NOTE — Progress Notes (Signed)
Per order, changed device settings for MRI to  AOO  at 75 bpm   Tachy-therapies to off if applicable.   Will program device back to pre-MRI settings after completion of exam

## 2020-11-13 NOTE — Progress Notes (Signed)
Informed of MRI for today.   Device system confirmed to be MRI conditional, with implant date > 6 weeks ago, and no evidence of abandoned or epicardial leads in review of most recent CXR Interrogation from today reviewed, pt is currently AP-VS at 60 bpm with <0.1% VP. Change device settings for MRI to  AOO  at 75 bpm  Tachy-therapies to off if applicable.  Program device back to pre-MRI settings after completion of exam.  Graciella Freer, PA-C  11/13/2020 2:22 PM

## 2020-11-17 ENCOUNTER — Telehealth: Payer: Self-pay | Admitting: Cardiovascular Disease

## 2020-11-17 NOTE — Telephone Encounter (Signed)
*  STAT* If patient is at the pharmacy, call can be transferred to refill team.   1. Which medications need to be refilled? (please list name of each medication and dose if known) new prescription for Fenofibrate\  2. Which pharmacy/location (including street and city if local pharmacy) is medication to be sent to? [facility] [place]  3. Do they need a 30 day or 90 day supply? 90 days and refills

## 2020-11-23 MED ORDER — FENOFIBRATE 145 MG PO TABS: ORAL_TABLET | ORAL | 3 refills | Status: DC

## 2020-11-23 NOTE — Telephone Encounter (Signed)
Spouse call saying only a 30 day supply was called in.  Insurance company will only pay for a 90 day supply with 1 refill please.  Please call in a 90 day supply.

## 2020-11-25 ENCOUNTER — Encounter: Payer: Self-pay | Admitting: Orthopedic Surgery

## 2020-11-26 ENCOUNTER — Telehealth: Payer: Self-pay | Admitting: *Deleted

## 2020-11-26 NOTE — Telephone Encounter (Signed)
Left a message for the patient to call back. The patient had left blood pressure readings for Dr. Royann Shivers to review. Per Dr. Royann Shivers, the patient should try to lose weight and decrease his sodium intake. Otherwise, continue the same medications.   Readings have been sent to be scanned.

## 2020-11-26 NOTE — Telephone Encounter (Signed)
Patient has been made aware and verbalized his understanding.  

## 2020-12-24 DIAGNOSIS — R519 Headache, unspecified: Secondary | ICD-10-CM | POA: Diagnosis not present

## 2020-12-24 DIAGNOSIS — J329 Chronic sinusitis, unspecified: Secondary | ICD-10-CM | POA: Diagnosis not present

## 2020-12-24 DIAGNOSIS — R2689 Other abnormalities of gait and mobility: Secondary | ICD-10-CM | POA: Diagnosis not present

## 2021-01-04 ENCOUNTER — Other Ambulatory Visit (HOSPITAL_COMMUNITY): Payer: Self-pay | Admitting: Physician Assistant

## 2021-01-04 ENCOUNTER — Other Ambulatory Visit (HOSPITAL_COMMUNITY): Payer: Self-pay | Admitting: Neurological Surgery

## 2021-01-04 DIAGNOSIS — M4802 Spinal stenosis, cervical region: Secondary | ICD-10-CM

## 2021-01-04 DIAGNOSIS — R519 Headache, unspecified: Secondary | ICD-10-CM

## 2021-01-04 DIAGNOSIS — J329 Chronic sinusitis, unspecified: Secondary | ICD-10-CM

## 2021-01-04 DIAGNOSIS — M48062 Spinal stenosis, lumbar region with neurogenic claudication: Secondary | ICD-10-CM

## 2021-01-04 DIAGNOSIS — R2689 Other abnormalities of gait and mobility: Secondary | ICD-10-CM

## 2021-01-05 NOTE — Progress Notes (Signed)
Spoke with patient in regards to last minute add on for MRI scan. Patient has medtronic device and instructed to arrive between 12-1113 to fill out paperwork and interrogate device.

## 2021-01-06 ENCOUNTER — Ambulatory Visit (HOSPITAL_COMMUNITY)
Admission: RE | Admit: 2021-01-06 | Discharge: 2021-01-06 | Disposition: A | Discharge status: Home / Self Care | Payer: BC Managed Care – PPO | Source: Ambulatory Visit | Attending: Physician Assistant | Admitting: Physician Assistant

## 2021-01-06 ENCOUNTER — Telehealth: Payer: Self-pay

## 2021-01-06 ENCOUNTER — Other Ambulatory Visit: Payer: Self-pay

## 2021-01-06 DIAGNOSIS — J329 Chronic sinusitis, unspecified: Secondary | ICD-10-CM | POA: Diagnosis not present

## 2021-01-06 DIAGNOSIS — R2689 Other abnormalities of gait and mobility: Secondary | ICD-10-CM | POA: Insufficient documentation

## 2021-01-06 DIAGNOSIS — R519 Headache, unspecified: Secondary | ICD-10-CM | POA: Insufficient documentation

## 2021-01-06 MED ORDER — GADOBUTROL 1 MMOL/ML IV SOLN
10.0000 mL | Freq: Once | INTRAVENOUS | Status: AC | PRN
  Administered 2021-01-06: 10 mL via INTRAVENOUS

## 2021-01-06 NOTE — Progress Notes (Signed)
Informed of MRI for today.   Device system confirmed to be MRI conditional, with implant date > 6 weeks ago, and no evidence of abandoned or epicardial leads in review of most recent CXR Interrogation from today reviewed, pt is currently AS-VS at 95 bpm  HR improved to 85 with rest.   Change device settings for MRI to AOO at 90 bpm  Tachy-therapies to off if applicable.  Program device back to pre-MRI settings after completion of exam.  Luane School  01/06/2021 12:35 PM

## 2021-01-06 NOTE — Telephone Encounter (Signed)
-----   Message from Graciella Freer, PA-C sent at 01/06/2021 12:46 PM EST ----- Pt with Revo that didn't flag for nearing ERI.   Can we please set him up for monthly remotes starting next week?   Thank you!

## 2021-01-06 NOTE — Telephone Encounter (Signed)
Successful telephone encounter to patient to follow up on need for monthly battery checks per AT request as patient nearing ERI at 2.82V, ERI will be confirmed at 2.81V. Patient appreciated of call and follow up. Patient will be out of town next week so monthly remotes will begin 01/18/21. Will continue to monitor.

## 2021-01-18 ENCOUNTER — Telehealth: Payer: Self-pay | Admitting: Family Medicine

## 2021-01-18 ENCOUNTER — Ambulatory Visit: Payer: BC Managed Care – PPO

## 2021-01-18 NOTE — Telephone Encounter (Signed)
Verified patient currently has cough and some congestion, no fevers please advise   Pt wife called and stated she went to her dr and tested positive for the flu. Her dr told her that if her husband started the flu medication within 48 hours he may not get it. [facility].    [phone]

## 2021-01-18 NOTE — Telephone Encounter (Signed)
Pt wife called and stated she went to her dr and tested positive for the flu. Her dr told her that if her husband started the flu medication within 48 hours he may not get it. [facility] [place] [facility].   [phone]

## 2021-01-19 ENCOUNTER — Telehealth: Payer: Self-pay

## 2021-01-19 DIAGNOSIS — R001 Bradycardia, unspecified: Secondary | ICD-10-CM

## 2021-01-19 LAB — CUP PACEART REMOTE DEVICE CHECK
Battery Voltage: 2.82 V
Brady Statistic AP VP Percent: 0 %
Brady Statistic AP VS Percent: 0.14 %
Brady Statistic AS VP Percent: 9.26 %
Brady Statistic AS VS Percent: 90.59 %
Brady Statistic RA Percent Paced: 0.14 %
Brady Statistic RV Percent Paced: 9.72 %
Date Time Interrogation Session: 20221213140609
Implantable Lead Implant Date: 20120501
Implantable Lead Implant Date: 20120501
Implantable Lead Location: 753859
Implantable Lead Location: 753860
Implantable Pulse Generator Implant Date: 20120501
Lead Channel Impedance Value: 400 Ohm
Lead Channel Impedance Value: 656 Ohm
Lead Channel Sensing Intrinsic Amplitude: 13.881 mV
Lead Channel Sensing Intrinsic Amplitude: 2.913 mV
Lead Channel Setting Pacing Amplitude: 2.5 V
Lead Channel Setting Pacing Pulse Width: 0.4 ms
Lead Channel Setting Sensing Sensitivity: 0.9 mV

## 2021-01-19 MED ORDER — OSELTAMIVIR PHOSPHATE 75 MG PO CAPS: ORAL_CAPSULE | ORAL | 0 refills | Status: DC

## 2021-01-19 NOTE — Telephone Encounter (Signed)
Scheduled remote reviewed. Normal device function.   Battery voltage 2.81V, RRT reached 12/3 Next remote to be determined Route to triage   Unsuccessful telephone encounter to patient to discuss RRT status. Hipaa compliant VM message left requesting call back to [phone].

## 2021-01-19 NOTE — Telephone Encounter (Signed)
Pt contacted and verbalized understanding. Tamiflu 75 mg sent to pharmacy.

## 2021-01-19 NOTE — Telephone Encounter (Signed)
So Tamiflu can be used as a preventative 75 mg 1 daily for 10 days.  Some individuals cannot tolerate this medication because of severe nausea and vomiting.  Most people can tolerate it.  If he is interested may send in 10-day supply.  If he does get the flu such as fevers body aches sore throat etc. it would be wise to be tested seen and the typical treatment for people who option to utilize her treatment is Tamiflu 75 mg twice daily for 5 days

## 2021-01-21 NOTE — Telephone Encounter (Signed)
Spoke to patient to advise device at RRT and will need OV to discuss gen change w/ Dr. Ernest Pine. Advised I will forward to his nurse and someone will call with apt. PAtient agreeable and verbalized understanding.

## 2021-01-22 NOTE — Telephone Encounter (Signed)
Left a message for the patient to call back to schedule the patient's gen change procedure on 02/15/21

## 2021-01-25 NOTE — Addendum Note (Signed)
Addended by: Sandi Mariscal on: 01/25/2021 02:28 PM   Modules accepted: Orders

## 2021-01-25 NOTE — Telephone Encounter (Signed)
Spoke to the patient about his gen change procedure. This will be scheduled for 02/15/21. Instructions will be mailed as well.     [facility]  [address]  [place], [place] [address]  Phone: [phone] Fax: [phone]    Generator Change Procedure Instructions  You are scheduled for a Generator Change (battery change) on  02/15/21  with Dr. Royann Shivers.  1. Please arrive at the [facility], Entrance "A"  at [facility] at 11:30 am on the day of your procedure. (The address is [address])  2. DIET: You may have a light, early breakfast the morning of your procedure. NOTHING TO EAT AFTER 8:00 AM.  3. LABS: Your provider would like for you to return by 02/08/21 to have the following labs drawn: BMET and CBC. You do not need an appointment for the lab. Once in our office lobby there is a podium where you can sign in and ring the doorbell to alert [place] that you are here. The lab is open from 8:00 am to 4:30 pm; closed for lunch from 12:45pm-1:45pm. You do not need to be fasting.   4. MEDICATIONS: Nothing to hold  5.  Plan for an overnight stay.  Bring your insurance cards and a list of you medications.  6.  Wash your chest and neck with surgical scrub the evening before and the morning of your procedure.  Rinse well. Please review the surgical scrub instruction sheet given to you.   7. Your chest will need to be shaved prior to this procedure (if needed). We ask that you do this yourself at home 1 to 2 days before or if uncomfortable/unable to do yourself, then it will be performed by the hospital staff the day of.  * Special note:  Every effort is made to have your procedure done on time.  Occasionally there are emergencies that present themselves at the hospital that may cause delays.  Please be patient if a delay does occur.                                                                                                            * If you have any questions after you get home, please call Misty Stanley, RN at [phone].     - Preparing For Surgery  Before surgery, you can play an important role. Because skin is not sterile, your skin needs to be as free of germs as possible. You can reduce the number of germs on your skin by washing with CHG (chlorahexidine gluconate) Soap before surgery.  CHG is an antiseptic cleaner which kills germs and bonds with the skin to continue killing germs even after washing.   Please do not use if you have an allergy to CHG or antibacterial soaps.  If your skin becomes reddened/irritated stop using the CHG.   Do not shave (including legs and underarms) for at least 48 hours prior to first CHG shower.  It is OK to shave your face.  Please follow these instructions carefully:  1.  Shower the night before surgery and the morning of surgery with CHG.  2.  If you choose to wash your hair, wash your hair first as usual with your normal shampoo.  3.  After you shampoo, rinse your hair and body thoroughly to remove the shampoo.  4.  Use CHG as you would any other liquid soap.  You can apply CHG directly to the skin and wash gently with a clean washcloth. 5.  Apply the CHG Soap to your body ONLY FROM THE NECK DOWN.  Do not use on open wounds or open sores.  Avoid contact with your eyes, ears, mouth and genitals (private parts).    6.  Wash thoroughly, paying special attention to the area where your surgery will be performed.  7.  Thoroughly rinse your body with warm water from the neck down.   8.  DO NOT shower/wash with your normal soap after using and rinsing off the CHG soap.  9.  Pat yourself dry with a clean towel.   10.  Wear clean pajamas.   11.  Place clean sheets on your bed the night of your first shower and do not sleep with pets.  Day of Surgery: Do not apply any deodorants/lotions.  Please wear clean clothes to the hospital/surgery center.

## 2021-01-26 ENCOUNTER — Encounter: Payer: Self-pay | Admitting: *Deleted

## 2021-01-26 DIAGNOSIS — R2689 Other abnormalities of gait and mobility: Secondary | ICD-10-CM | POA: Diagnosis not present

## 2021-01-26 DIAGNOSIS — R519 Headache, unspecified: Secondary | ICD-10-CM | POA: Diagnosis not present

## 2021-01-26 DIAGNOSIS — J329 Chronic sinusitis, unspecified: Secondary | ICD-10-CM | POA: Diagnosis not present

## 2021-01-27 ENCOUNTER — Telehealth: Payer: Self-pay | Admitting: Cardiovascular Disease

## 2021-01-27 NOTE — Telephone Encounter (Signed)
° °  Pt said he missed a call from [facility] yesterday and he is returning her call

## 2021-01-27 NOTE — Telephone Encounter (Signed)
Called patient back. He was advised that lisa mailed out instructions and they should be getting to him shortly.  Patient verbalized understanding, thankful for call back

## 2021-02-09 DIAGNOSIS — R001 Bradycardia, unspecified: Secondary | ICD-10-CM | POA: Diagnosis not present

## 2021-02-09 LAB — BASIC METABOLIC PANEL
BUN/Creatinine Ratio: 17 (ref 10–24)
BUN: 20 mg/dL (ref 8–27)
CO2: 23 mmol/L (ref 20–29)
Calcium: 9.6 mg/dL (ref 8.6–10.2)
Chloride: 105 mmol/L (ref 96–106)
Creatinine, Ser: 1.19 mg/dL (ref 0.76–1.27)
Glucose: 94 mg/dL (ref 70–99)
Potassium: 4.1 mmol/L (ref 3.5–5.2)
Sodium: 141 mmol/L (ref 134–144)
eGFR: 69 mL/min/{1.73_m2} (ref >59)

## 2021-02-09 LAB — CBC
Hematocrit: 42.5 % (ref 37.5–51.0)
Hemoglobin: 14.8 g/dL (ref 13.0–17.7)
MCH: 32.5 pg (ref 26.6–33.0)
MCHC: 34.8 g/dL (ref 31.5–35.7)
MCV: 93 fL (ref 79–97)
Platelets: 200 10*3/uL (ref 150–450)
RBC: 4.55 x10E6/uL (ref 4.14–5.80)
RDW: 12.2 % (ref 11.6–15.4)
WBC: 6.3 10*3/uL (ref 3.4–10.8)

## 2021-02-14 ENCOUNTER — Other Ambulatory Visit: Payer: Self-pay | Admitting: *Deleted

## 2021-02-14 DIAGNOSIS — R001 Bradycardia, unspecified: Secondary | ICD-10-CM

## 2021-02-15 ENCOUNTER — Ambulatory Visit (HOSPITAL_COMMUNITY)
Admission: RE | Disposition: A | Discharge status: Home / Self Care | Payer: BC Managed Care – PPO | Source: Home / Self Care | Attending: Cardiovascular Disease

## 2021-02-15 ENCOUNTER — Other Ambulatory Visit: Payer: Self-pay

## 2021-02-15 ENCOUNTER — Ambulatory Visit (HOSPITAL_COMMUNITY)
Admission: RE | Admit: 2021-02-15 | Discharge: 2021-02-15 | Disposition: A | Discharge status: Home / Self Care | Payer: BC Managed Care – PPO | Attending: Cardiovascular Disease | Admitting: Cardiovascular Disease

## 2021-02-15 DIAGNOSIS — R001 Bradycardia, unspecified: Secondary | ICD-10-CM | POA: Diagnosis not present

## 2021-02-15 DIAGNOSIS — I495 Sick sinus syndrome: Secondary | ICD-10-CM | POA: Insufficient documentation

## 2021-02-15 DIAGNOSIS — R55 Syncope and collapse: Secondary | ICD-10-CM | POA: Diagnosis not present

## 2021-02-15 DIAGNOSIS — E782 Mixed hyperlipidemia: Secondary | ICD-10-CM | POA: Insufficient documentation

## 2021-02-15 DIAGNOSIS — Z45010 Encounter for checking and testing of cardiac pacemaker pulse generator [battery]: Secondary | ICD-10-CM

## 2021-02-15 HISTORY — PX: PPM GENERATOR CHANGEOUT: EP1233

## 2021-02-15 SURGERY — PPM GENERATOR CHANGEOUT

## 2021-02-15 MED ORDER — CHLORHEXIDINE GLUCONATE 4 % EX LIQD: 4.0000 "application " | Freq: Once | CUTANEOUS | Status: DC

## 2021-02-15 MED ORDER — SODIUM CHLORIDE 0.9 % IV SOLN
INTRAVENOUS | Status: AC
  Filled 2021-02-15: qty 2

## 2021-02-15 MED ORDER — CEFAZOLIN SODIUM-DEXTROSE 2-4 GM/100ML-% IV SOLN
INTRAVENOUS | Status: AC
  Filled 2021-02-15: qty 100

## 2021-02-15 MED ORDER — SODIUM CHLORIDE 0.9 % IV SOLN
INTRAVENOUS | Status: DC
  Administered 2021-02-15: 50 mL via INTRAVENOUS

## 2021-02-15 MED ORDER — HEPARIN (PORCINE) IN NACL 1000-0.9 UT/500ML-% IV SOLN
INTRAVENOUS | Status: AC
  Filled 2021-02-15: qty 1000

## 2021-02-15 MED ORDER — LIDOCAINE HCL (PF) 1 % IJ SOLN
INTRAMUSCULAR | Status: AC
  Filled 2021-02-15: qty 60

## 2021-02-15 MED ORDER — LIDOCAINE HCL (PF) 1 % IJ SOLN
INTRAMUSCULAR | Status: DC | PRN
  Administered 2021-02-15: 30 mL

## 2021-02-15 MED ORDER — MIDAZOLAM HCL 5 MG/5ML IJ SOLN
INTRAMUSCULAR | Status: AC
  Filled 2021-02-15: qty 5

## 2021-02-15 MED ORDER — FENTANYL CITRATE (PF) 100 MCG/2ML IJ SOLN
INTRAMUSCULAR | Status: AC
  Filled 2021-02-15: qty 2

## 2021-02-15 MED ORDER — CEFAZOLIN SODIUM-DEXTROSE 2-4 GM/100ML-% IV SOLN
2.0000 g | INTRAVENOUS | Status: AC
  Administered 2021-02-15: 2 g via INTRAVENOUS

## 2021-02-15 MED ORDER — SODIUM CHLORIDE 0.9 % IV SOLN
80.0000 mg | INTRAVENOUS | Status: AC
  Administered 2021-02-15: 80 mg

## 2021-02-15 SURGICAL SUPPLY — 5 items
CABLE SURGICAL S-101-97-12 (CABLE) ×2 IMPLANT
IPG PACE AZUR XT DR MRI W1DR01 (Pacemaker) IMPLANT
PACE AZURE XT DR MRI W1DR01 (Pacemaker) ×2 IMPLANT
PAD DEFIB RADIO PHYSIO CONN (PAD) ×2 IMPLANT
TRAY PACEMAKER INSERTION (PACKS) ×2 IMPLANT

## 2021-02-15 NOTE — H&P (Signed)
Cardiology Admission History and Physical:   Patient ID: [name] MRN: [id number]; DOB: [birth year]   Admission date: 02/15/2021  PCP:  Kathyrn Drown, MD   [facility] Providers Cardiologist:  Steven Klein, MD        Chief Complaint: Pacemaker at ERI  Patient Profile:   [name] is a 66 y.o. male with history of neurocardiogenic syncope not responsive to conservative measures received a dual-chamber permanent pacemaker in 2012 who is being seen 02/15/2021 for pacemaker generator change out.  History of Present Illness:   [name] has a dual-chamber permanent pacemaker that reached ERI on January 09, 2021.  He is here for generator change out today.  The device was initially implanted for neurocardiogenic syncope, but over the years he has developed sinus bradycardia without being on chronotropically negative medications.  He requires roughly 90% atrial pacing but has normal AV conduction.  He had numerous episodes of syncope until the pacemaker was implanted and has not had syncope since that time.  Additional medical problems include treated hypercholesterolemia and GERD complicated by Barrett's esophagus.  The patient specifically denies any chest pain at rest exertion, dyspnea at rest or with exertion, orthopnea, paroxysmal nocturnal dyspnea, syncope, palpitations, focal neurological deficits, intermittent claudication, lower extremity edema, unexplained weight gain, cough, hemoptysis or wheezing.  All pacemaker lead parameters are within normal range and stable..   Past Medical History:  Diagnosis Date   Allergy    seasonal allergies   Arthritis    LEFT shoulder, RIGHT elbow   Barrett's esophagus    GERD (gastroesophageal reflux disease)    on meds   Hypercholesterolemia    triglycerides elevated-on meds   Hypotension    Pacemaker    Syncope     Past Surgical History:  Procedure Laterality Date   birthmark removal     right thigh   BLADDER  SURGERY     stretched   CARDIAC CATHETERIZATION  09/15/2006   Recommendation - empiric anti-reflux therapy   CARDIOVASCULAR STRESS TEST  04/08/2011   No scintigraphic evidence of inducible myocardial ischemia. No lexiscan EKG changes. Non-diagnostic for ischemia.   CERVICAL SPINE SURGERY     plates and screws   COLONOSCOPY  2011   DJ-F/V-moviprep(exc)TICS/HPP   LOWER EXTREMITY ARTERIAL DOPPLER  10/02/2006   No evidence of thrombus or thrombphlebitis.   PACEMAKER INSERTION  06/08/2010   Medtronic Revo model #[id number], serial [id number]   POLYPECTOMY  2011   HPP   TONSILLECTOMY     TRANSTHORACIC ECHOCARDIOGRAM  04/06/2011   EF 55-60%, normal   UPPER GASTROINTESTINAL ENDOSCOPY     WISDOM TOOTH EXTRACTION       Medications Prior to Admission: Prior to Admission medications   Medication Sig Start Date End Date Taking? Authorizing Provider  aspirin EC 81 MG tablet Take 81 mg by mouth daily.   Yes [provider]  fenofibrate (TRICOR) 145 MG tablet TAKE 1 TABLET (145 MG TOTAL) BY MOUTH DAILY. KEEP OFFICE VISIT 11/23/20  Yes Tashyra Adduci, MD  Multiple Vitamin (MULTIVITAMIN) tablet Take 1 tablet by mouth daily.   Yes [provider]  Naphazoline HCl (CLEAR EYES OP) Place 2 drops into both eyes daily as needed (for dry eyes).   Yes [provider]  nortriptyline (PAMELOR) 10 MG capsule Take 30 mg by mouth at bedtime. 01/27/21  Yes [provider]  omeprazole (PRILOSEC) 20 MG capsule TAKE 1 CAPSULE BY MOUTH EVERY DAY Patient taking differently: 20 mg daily as  needed (Heart burn). 03/09/18  Yes Milus Banister, MD  Pseudoephedrine HCl (SUDAFED 12 HOUR PO) Take 1 tablet by mouth daily as needed (Sinus).   Yes [provider]  diclofenac (VOLTAREN) 75 MG EC tablet Take 1 tablet (75 mg total) by mouth 2 (two) times daily with a meal. Patient not taking: Reported on 02/04/2021 10/29/20   Carole Civil, MD  oseltamivir (TAMIFLU) 75 MG capsule Take one  tablet po daily for 10 days 01/19/21   Kathyrn Drown, MD     Allergies:    Allergies  Allergen Reactions   Other Anaphylaxis    GI Cocktail Chinese food- causes upset stomach and vomiting   Norvasc [Amlodipine Besylate] Swelling   Levofloxacin Itching and Rash   Sulfonamide Derivatives Swelling    REACTION: tongue swells    Social History:   Social History   Socioeconomic History   Marital status: Married    Spouse name: Not on file   Number of children: 2   Years of education: Not on file   Highest education level: Not on file  Occupational History   Occupation: Environmental health practitioner, Freight forwarder, 3rd shift    Employer: [employer]  Tobacco Use   Smoking status: Former    Types: Cigarettes    Quit date: 11/27/1973    Years since quitting: 47.2   Smokeless tobacco: Never  Vaping Use   Vaping Use: Never used  Substance and Sexual Activity   Alcohol use: No   Drug use: No   Sexual activity: Not on file  Other Topics Concern   Not on file  Social History Narrative   Lives with wife, who is a Marine scientist.   Social Determinants of Health   Financial Resource Strain: Not on file  Food Insecurity: Not on file  Transportation Needs: Not on file  Physical Activity: Not on file  Stress: Not on file  Social Connections: Not on file  Intimate Partner Violence: Not on file    Family History:   The patient's family history includes COPD in his sister; Heart attack in his father and sister; Heart failure in his father; Hyperlipidemia in his brother and sister; Hypertension in his brother; Lymphoma in his brother; Ovarian cancer in his mother; Throat cancer (age of onset: 68) in his maternal uncle. There is no history of Colon cancer, Stomach cancer, Colon polyps, Rectal cancer, or Esophageal cancer.    ROS:  Please see the history of present illness.  All other ROS reviewed and negative.     Physical Exam/Data:   Vitals:   02/15/21 1203  BP: (!) 151/103  Pulse: 81   Temp: 97.7 F (36.5 C)  SpO2: 98%  Weight: 116.1 kg  Height: 6\' 1"  (1.854 m)   No intake or output data in the 24 hours ending 02/15/21 1339 Last 3 Weights 02/15/2021 11/11/2020 10/29/2020  Weight (lbs) 256 lb 253 lb 246 lb 8 oz  Weight (kg) 116.121 kg 114.76 kg 111.812 kg     Body mass index is 33.78 kg/m.  General:  Well nourished, well developed, in no acute distress HEENT: normal Neck: no JVD Vascular: No carotid bruits; Distal pulses 2+ bilaterally   Cardiac:  normal S1, S2; RRR; no murmur.  Healthy left subclavian pacemaker site. Lungs:  clear to auscultation bilaterally, no wheezing, rhonchi or rales  Abd: soft, nontender, no hepatomegaly  Ext: no edema Musculoskeletal:  No deformities, BUE and BLE strength normal and equal Skin: warm and dry  Neuro:  CNs 2-12 intact, no focal abnormalities noted Psych:  Normal affect    EKG:  The ECG that was done on 10/26/2020 was personally reviewed and demonstrates atrial paced, ventricular sensed rhythm with incomplete right bundle branch block.  Relevant CV Studies: Apprehensive preprocedure pacemaker check shows normal atrial and ventricular lead parameters (Medtronic 5086 leads) and device at ERI.  Laboratory Data:  High Sensitivity Troponin:  No results for input(s): TROPONINIHS in the last 720 hours.    Chemistry Recent Labs  Lab 02/09/21 1056  NA 141  K 4.1  CL 105  CO2 23  GLUCOSE 94  BUN 20  CREATININE 1.19  CALCIUM 9.6    No results for input(s): PROT, ALBUMIN, AST, ALT, ALKPHOS, BILITOT in the last 168 hours. Lipids No results for input(s): CHOL, TRIG, HDL, LABVLDL, LDLCALC, CHOLHDL in the last 168 hours. Hematology Recent Labs  Lab 02/09/21 1056  WBC 6.3  RBC 4.55  HGB 14.8  HCT 42.5  MCV 93  MCH 32.5  MCHC 34.8  RDW 12.2  PLT 200   Thyroid No results for input(s): TSH, FREET4 in the last 168 hours. BNPNo results for input(s): BNP, PROBNP in the last 168 hours.  DDimer No results for input(s):  DDIMER in the last 168 hours.   Radiology/Studies:  No results found.   Assessment and Plan:   History of neurocardiogenic syncope: Resolved after pacemaker implantation. SSS: Developed gradually following implantation of his pacemaker.  Now has roughly 90% atrial paced.  We will turn rate response on. Pacemaker battery depletion: He will receive a new MRI conditional device today.  As MRI conditional leads. This procedure has been fully reviewed with the patient and written informed consent has been obtained. Mixed hyperlipidemia: On fenofibrate.  With the exception of a stubbornly low HDL, all other lipid parameters are within target range on current medication.      For questions or updates, please contact [place] Please consult [email or web address] for contact info under     Signed, Steven Klein, MD  02/15/2021 1:39 PM

## 2021-02-15 NOTE — Op Note (Signed)
Procedure report  Procedure performed:  Dual chamber pacemaker generator changeout  Reason for procedure:  Device generator at elective replacement interval  Neurocardiogenic syncope SSS  Procedure performed by:  Sanda Klein, MD  Complications:  None  Estimated blood loss:  <5 mL  Medications administered during procedure:  Ancef 2 g intravenously, lidocaine 1% 30 mL locally Device details:   New Generator Medtronic Azure XT DR model number [id number], serial number [id number] Right atrial lead (chronic) Medtronic 5086 MRI, serial number [id number] (implanted 06/08/2010) Right ventricular lead (chronic) Medtronic 5086 MRI, serial number [id number] (implanted 06/08/2010)  Explanted generator Medtronic Revo,  model number RVDR01, serial number  [id number] (implanted 06/08/2010)  Procedure details:  After the risks and benefits of the procedure were discussed the patient provided informed consent. She was brought to the cardiac catheter lab in the fasting state. The patient was prepped and draped in usual sterile fashion. Local anesthesia with 1% lidocaine was administered to to the left infraclavicular area. A 5-6cm horizontal incision was made parallel with and 2-3 cm caudal to the left clavicle, in the area of an old scar. Using minimal electrocautery and mostly sharp and blunt dissection the prepectoral pocket was opened carefully to avoid injury to the loops of chronic leads. Extensive dissection was not necessary. The device was explanted. The pocket was carefully inspected for hemostasis and flushed with copious amounts of antibiotic solution.  The leads were disconnected from the old generator and the new generator was connected to the chronic leads, with appropriate pacing noted.  Testing of the lead parameters via telemetry showed excellent values.   The entire system was then carefully inserted in the pocket with care been taking that the leads and device assumed a comfortable  position without pressure on the incision. Great care was taken that the leads be located deep to the generator. The pocket was then closed in layers using 2 layers of 2-0 Vicryl, one layer of 3-) Vicryl and cutaneous steristrips after which a sterile dressing was applied.   At the end of the procedure the following lead parameters were encountered:   Right atrial lead sensed P waves 2.5 mV, impedance 418 ohms, threshold 0.5 at 0.4 ms pulse width.  Right ventricular lead sensed R waves  16.3 mV, impedance 494 ohms, threshold 0.75 at 0.4 ms pulse width.  Sanda Klein, MD, [place] [facility] [phone] office [phone] pager

## 2021-02-15 NOTE — Discharge Instructions (Addendum)
Implantable Cardiac Device Battery Change, Care After  This sheet gives you information about how to care for yourself after your procedure. Your health care provider may also give you more specific instructions. If you have problems or questions, contact your health care provider. What can I expect after the procedure? After your procedure, it is common to have:  Pain or soreness at the site where the cardiac device was inserted.  Swelling at the site where the cardiac device was inserted.  You should received an information card for your new device in 4-8 weeks. Follow these instructions at home: Incision care   Keep the incision clean and dry. ? Do not take baths, swim, or use a hot tub until after your wound check.  ? Do not shower for at least 7 days, or as directed by your health care provider. ? Pat the area dry with a clean towel. Do not rub the area. This may cause bleeding.  Follow instructions from your health care provider about how to take care of your incision. Make sure you: ? Leave stitches (sutures), skin glue, or adhesive strips in place. These skin closures may need to stay in place for 2 weeks or longer. If adhesive strip edges start to loosen and curl up, you may trim the loose edges. Do not remove adhesive strips completely unless your health care provider tells you to do that.  Check your incision area every day for signs of infection. Check for: ? More redness, swelling, or pain. ? More fluid or blood. ? Warmth. ? Pus or a bad smell. Activity  Do not lift anything that is heavier than 10 lb (4.5 kg) until your health care provider says it is okay to do so.  For the first week, or as long as told by your health care provider: ? Avoid lifting your affected arm higher than your shoulder. ? After 1 week, Be gentle when you move your arms over your head. It is okay to raise your arm to comb your hair. ? Avoid strenuous exercise.  Ask your health care provider  when it is okay to: ? Resume your normal activities. ? Return to work or school. ? Resume sexual activity. Eating and drinking  Eat a heart-healthy diet. This should include plenty of fresh fruits and vegetables, whole grains, low-fat dairy products, and lean protein like chicken and fish.  Limit alcohol intake to no more than 1 drink a day for non-pregnant women and 2 drinks a day for men. One drink equals 12 oz of beer, 5 oz of wine, or 1 oz of hard liquor.  Check ingredients and nutrition facts on packaged foods and beverages. Avoid the following types of food: ? Food that is high in salt (sodium). ? Food that is high in saturated fat, like full-fat dairy or red meat. ? Food that is high in trans fat, like fried food. ? Food and drinks that are high in sugar. Lifestyle  Do not use any products that contain nicotine or tobacco, such as cigarettes and e-cigarettes. If you need help quitting, ask your health care provider.  Take steps to manage and control your weight.  Once cleared, get regular exercise. Aim for 150 minutes of moderate-intensity exercise (such as walking or yoga) or 75 minutes of vigorous exercise (such as running or swimming) each week.  Manage other health problems, such as diabetes or high blood pressure. Ask your health care provider how you can manage these conditions. General instructions  Do   not drive for 24 hours after your procedure if you were given a medicine to help you relax (sedative).  Take over-the-counter and prescription medicines only as told by your health care provider.  Avoid putting pressure on the area where the cardiac device was placed.  If you need an MRI after your cardiac device has been placed, be sure to tell the health care provider who orders the MRI that you have a cardiac device.  Avoid close and prolonged exposure to electrical devices that have strong magnetic fields. These include: ? Cell phones. Avoid keeping them in a  pocket near the cardiac device, and try using the ear opposite the cardiac device. ? MP3 players. ? Household appliances, like microwaves. ? Metal detectors. ? Electric generators. ? High-tension wires.  Keep all follow-up visits as directed by your health care provider. This is important. Contact a health care provider if:  You have pain at the incision site that is not relieved by over-the-counter or prescription medicines.  You have any of these around your incision site or coming from it: ? More redness, swelling, or pain. ? Fluid or blood. ? Warmth to the touch. ? Pus or a bad smell.  You have a fever.  You feel brief, occasional palpitations, light-headedness, or any symptoms that you think might be related to your heart. Get help right away if:  You experience chest pain that is different from the pain at the cardiac device site.  You develop a red streak that extends above or below the incision site.  You experience shortness of breath.  You have palpitations or an irregular heartbeat.  You have light-headedness that does not go away quickly.  You faint or have dizzy spells.  Your pulse suddenly drops or increases rapidly and does not return to normal.  You begin to gain weight and your legs and ankles swell. Summary  After your procedure, it is common to have pain, soreness, and some swelling where the cardiac device was inserted.  Make sure to keep your incision clean and dry. Follow instructions from your health care provider about how to take care of your incision.  Check your incision every day for signs of infection, such as more pain or swelling, pus or a bad smell, warmth, or leaking fluid and blood.  Avoid strenuous exercise and lifting your left arm higher than your shoulder for 2 weeks, or as long as told by your health care provider. This information is not intended to replace advice given to you by your health care provider. Make sure you discuss  any questions you have with your health care provider.  Supplemental Discharge Instructions for  Pacemaker/Defibrillator Patients  Activity No restrictions. DO wear your seatbelt, even if it crosses over the pacemaker site.  WOUND CARE - Keep the wound area clean and dry.  Remove the dressing the day after you return home (usually 48 hours after the procedure). - DO NOT SUBMERGE UNDER WATER UNTIL FULLY HEALED (no tub baths, hot tubs, swimming pools, etc.).  - You  may shower or take a sponge bath after the dressing is removed. DO NOT SOAK the area and do not allow the shower to directly spray on the site. - If you have tape/steri-strips on your wound, these will fall off; do not pull them off prematurely.   - No bandage is needed on the site.  DO  NOT apply any creams, oils, or ointments to the wound area. - If you notice any drainage   or discharge from the wound, any swelling, excessive redness or bruising at the site, or if you develop a fever > 101? F after you are discharged home, call the office at once.  Special Instructions - You are still able to use cellular telephones.  Avoid carrying your cellular phone near your device. - When traveling through airports, show security personnel your identification card to avoid being screened in the metal detectors.  - Avoid arc welding equipment, MRI testing (magnetic resonance imaging), TENS units (transcutaneous nerve stimulators).  Call the office for questions about other devices. - Avoid electrical appliances that are in poor condition or are not properly grounded. - Microwave ovens are safe to be near or to operate.    

## 2021-02-16 ENCOUNTER — Encounter (HOSPITAL_COMMUNITY): Payer: Self-pay | Admitting: Cardiovascular Disease

## 2021-02-16 MED FILL — Fentanyl Citrate Preservative Free (PF) Inj 100 MCG/2ML: INTRAMUSCULAR | Qty: 2 | Status: AC

## 2021-02-16 MED FILL — Midazolam HCl Inj 5 MG/5ML (Base Equivalent): INTRAMUSCULAR | Qty: 5 | Status: AC

## 2021-02-25 ENCOUNTER — Other Ambulatory Visit: Payer: Self-pay

## 2021-02-25 ENCOUNTER — Ambulatory Visit (HOSPITAL_COMMUNITY): Payer: BC Managed Care – PPO

## 2021-02-25 ENCOUNTER — Ambulatory Visit (INDEPENDENT_AMBULATORY_CARE_PROVIDER_SITE_OTHER): Payer: BC Managed Care – PPO

## 2021-02-25 DIAGNOSIS — R001 Bradycardia, unspecified: Secondary | ICD-10-CM

## 2021-02-25 LAB — CUP PACEART INCLINIC DEVICE CHECK
Battery Remaining Longevity: 178 mo
Battery Voltage: 3.22 V
Brady Statistic AP VP Percent: 0.01 %
Brady Statistic AP VS Percent: 10.37 %
Brady Statistic AS VP Percent: 0.03 %
Brady Statistic AS VS Percent: 89.59 %
Brady Statistic RA Percent Paced: 10.42 %
Brady Statistic RV Percent Paced: 0.04 %
Date Time Interrogation Session: 20230119123734
Implantable Lead Implant Date: 20120501
Implantable Lead Implant Date: 20120501
Implantable Lead Location: 753859
Implantable Lead Location: 753860
Implantable Pulse Generator Implant Date: 20230109
Lead Channel Impedance Value: 361 Ohm
Lead Channel Impedance Value: 418 Ohm
Lead Channel Impedance Value: 532 Ohm
Lead Channel Impedance Value: 570 Ohm
Lead Channel Pacing Threshold Amplitude: 0.75 V
Lead Channel Pacing Threshold Amplitude: 0.75 V
Lead Channel Pacing Threshold Pulse Width: 0.4 ms
Lead Channel Pacing Threshold Pulse Width: 0.4 ms
Lead Channel Sensing Intrinsic Amplitude: 16 mV
Lead Channel Sensing Intrinsic Amplitude: 19.875 mV
Lead Channel Sensing Intrinsic Amplitude: 3.125 mV
Lead Channel Sensing Intrinsic Amplitude: 3.625 mV
Lead Channel Setting Pacing Amplitude: 2.25 V
Lead Channel Setting Pacing Amplitude: 3.5 V
Lead Channel Setting Pacing Pulse Width: 0.4 ms
Lead Channel Setting Sensing Sensitivity: 0.9 mV

## 2021-02-25 NOTE — Patient Instructions (Addendum)

## 2021-02-25 NOTE — Progress Notes (Signed)
Wound check appointment. Steri-strips removed. Wound without redness or edema. Incision edges approximated, wound well healed. Normal device function. Thresholds, sensing, and impedances consistent with implant measurements. Device programmed at chronic settings. Histogram distribution appropriate for patient and level of activity. No mode switches or high ventricular rates noted. Patient educated about wound care, arm mobility. ROV in 3 months with implanting physician.

## 2021-03-03 ENCOUNTER — Other Ambulatory Visit (HOSPITAL_COMMUNITY): Payer: BC Managed Care – PPO

## 2021-03-03 ENCOUNTER — Ambulatory Visit (HOSPITAL_COMMUNITY): Payer: BC Managed Care – PPO

## 2021-05-18 DIAGNOSIS — B078 Other viral warts: Secondary | ICD-10-CM | POA: Diagnosis not present

## 2021-05-18 DIAGNOSIS — L821 Other seborrheic keratosis: Secondary | ICD-10-CM | POA: Diagnosis not present

## 2021-05-27 ENCOUNTER — Ambulatory Visit (INDEPENDENT_AMBULATORY_CARE_PROVIDER_SITE_OTHER): Payer: BC Managed Care – PPO

## 2021-05-27 DIAGNOSIS — R001 Bradycardia, unspecified: Secondary | ICD-10-CM

## 2021-05-28 LAB — CUP PACEART REMOTE DEVICE CHECK
Battery Remaining Longevity: 170 mo
Battery Voltage: 3.19 V
Brady Statistic AP VP Percent: 0.02 %
Brady Statistic AP VS Percent: 50.63 %
Brady Statistic AS VP Percent: 0.01 %
Brady Statistic AS VS Percent: 49.33 %
Brady Statistic RA Percent Paced: 50.75 %
Brady Statistic RV Percent Paced: 0.04 %
Date Time Interrogation Session: 20230420151838
Implantable Lead Implant Date: 20120501
Implantable Lead Implant Date: 20120501
Implantable Lead Location: 753859
Implantable Lead Location: 753860
Implantable Pulse Generator Implant Date: 20230109
Lead Channel Impedance Value: 380 Ohm
Lead Channel Impedance Value: 418 Ohm
Lead Channel Impedance Value: 532 Ohm
Lead Channel Impedance Value: 570 Ohm
Lead Channel Pacing Threshold Amplitude: 0.75 V
Lead Channel Pacing Threshold Amplitude: 0.75 V
Lead Channel Pacing Threshold Pulse Width: 0.4 ms
Lead Channel Pacing Threshold Pulse Width: 0.4 ms
Lead Channel Sensing Intrinsic Amplitude: 16.625 mV
Lead Channel Sensing Intrinsic Amplitude: 16.625 mV
Lead Channel Sensing Intrinsic Amplitude: 2.5 mV
Lead Channel Sensing Intrinsic Amplitude: 2.5 mV
Lead Channel Setting Pacing Amplitude: 1.5 V
Lead Channel Setting Pacing Amplitude: 2 V
Lead Channel Setting Pacing Pulse Width: 0.4 ms
Lead Channel Setting Sensing Sensitivity: 0.9 mV

## 2021-05-31 ENCOUNTER — Encounter: Payer: Self-pay | Admitting: Cardiovascular Disease

## 2021-05-31 ENCOUNTER — Ambulatory Visit (INDEPENDENT_AMBULATORY_CARE_PROVIDER_SITE_OTHER): Payer: BC Managed Care – PPO | Admitting: Cardiovascular Disease

## 2021-05-31 VITALS — BP 128/88 | HR 73 | Ht 73.0 in | Wt 253.0 lb

## 2021-05-31 DIAGNOSIS — E785 Hyperlipidemia, unspecified: Secondary | ICD-10-CM | POA: Diagnosis not present

## 2021-05-31 DIAGNOSIS — R55 Syncope and collapse: Secondary | ICD-10-CM | POA: Diagnosis not present

## 2021-05-31 DIAGNOSIS — Z950 Presence of cardiac pacemaker: Secondary | ICD-10-CM

## 2021-05-31 DIAGNOSIS — R030 Elevated blood-pressure reading, without diagnosis of hypertension: Secondary | ICD-10-CM

## 2021-05-31 DIAGNOSIS — E669 Obesity, unspecified: Secondary | ICD-10-CM | POA: Diagnosis not present

## 2021-05-31 NOTE — Progress Notes (Signed)
? ?Cardiology Office Note   ? ?Date:  05/31/2021  ? ?ID:  [name], DOB 08-01-1955, MRN [id number] ? ?PCP:  Babs Sciara, MD  ?Cardiologist:   Thurmon Fair, MD  ? ?Chief Complaint  ?Patient presents with  ? Pacemaker Check  ? ? ? ?History of Present Illness:  ?[name] is a 66 y.o. 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  He underwent a pacemaker generator change out roughly 3 months ago (Medtronic Azure). ? ?He has a little bit of stinging at the pacemaker, where the new scar has a little bit of keloid formation.  However the scar is well-healed and there is no evidence of redness, swelling, drainage or any suspicion for infection at this time. ? ?He has done well since his pacemaker change out.  He remains very active (pacemaker shows 10.5 hours/day of average activity).  He has not had presyncope or syncope.  His device has recorded a single 5 beat episode of nonsustained VT since its implantation.  He has not had any episodes of true SVT (1 episode is consistent with sinus tachycardia).  He has 51% atrial pacing and no ventricular pacing.  Estimated generator longevity is 14 years. ? ?The patient specifically denies any chest pain at rest exertion, dyspnea at rest or with exertion, orthopnea, paroxysmal nocturnal dyspnea, syncope, palpitations, focal neurological deficits, intermittent claudication, lower extremity edema, unexplained weight gain, cough, hemoptysis or wheezing. ? ?On his most recent lipid profile from April 2022 , he continues to have a low HDL at 32, but otherwise his lipid profile is acceptable and he does not have diabetes mellitus. ? ?Past Medical History:  ?Diagnosis Date  ? Allergy   ? seasonal allergies  ? Arthritis   ?  LEFT shoulder, RIGHT elbow  ? Barrett's esophagus   ? GERD (gastroesophageal reflux disease)   ? on meds  ? Hypercholesterolemia   ? triglycerides elevated-on meds  ? Hypotension   ? Pacemaker   ? Syncope   ? ? ?Past Surgical History:  ?Procedure Laterality Date  ? birthmark removal    ? right thigh  ? BLADDER SURGERY    ? stretched  ? CARDIAC CATHETERIZATION  09/15/2006  ? Recommendation - empiric anti-reflux therapy  ? CARDIOVASCULAR STRESS TEST  04/08/2011  ? No scintigraphic evidence of inducible myocardial ischemia. No lexiscan EKG changes. Non-diagnostic for ischemia.  ? CERVICAL SPINE SURGERY    ? plates and screws  ? COLONOSCOPY  2011  ? DJ-F/V-moviprep(exc)TICS/HPP  ? LOWER EXTREMITY ARTERIAL DOPPLER  10/02/2006  ? No evidence of thrombus or thrombphlebitis.  ? PACEMAKER INSERTION  06/08/2010  ? Medtronic Revo model A010322, serial [id number]  ? POLYPECTOMY  2011  ? HPP  ? PPM GENERATOR CHANGEOUT N/A 02/15/2021  ? Procedure: PPM GENERATOR CHANGEOUT;  Surgeon: Thurmon Fair, MD;  Location: [facility];  Service: Cardiovascular;  Laterality: N/A;  ? TONSILLECTOMY    ? TRANSTHORACIC ECHOCARDIOGRAM  04/06/2011  ? EF 55-60%, normal  ? UPPER GASTROINTESTINAL ENDOSCOPY    ? WISDOM TOOTH EXTRACTION    ? ? ?Current Medications: ?Outpatient Medications Prior to Visit  ?Medication Sig Dispense Refill  ? aspirin EC 81 MG tablet Take 81 mg by mouth daily.    ? fenofibrate (TRICOR) 145 MG tablet TAKE 1 TABLET (145 MG TOTAL)  BY MOUTH DAILY. KEEP OFFICE VISIT 90 tablet 3  ? Multiple Vitamin (MULTIVITAMIN) tablet Take 1 tablet by mouth daily.    ? omeprazole (PRILOSEC) 20 MG capsule TAKE 1 CAPSULE BY MOUTH EVERY DAY 90 capsule 3  ? diclofenac (VOLTAREN) 75 MG EC tablet Take 1 tablet (75 mg total) by mouth 2 (two) times daily with a meal. (Patient not taking: Reported on 05/31/2021) 60 tablet 2  ? Naphazoline HCl (CLEAR EYES OP) Place 2 drops into both eyes daily as needed (for dry eyes). (Patient not taking: Reported on  05/31/2021)    ? nortriptyline (PAMELOR) 10 MG capsule Take 30 mg by mouth at bedtime. (Patient not taking: Reported on 05/31/2021)    ? oseltamivir (TAMIFLU) 75 MG capsule Take one tablet po daily for 10 days (Patient not taking: Reported on 05/31/2021) 10 capsule 0  ? Pseudoephedrine HCl (SUDAFED 12 HOUR PO) Take 1 tablet by mouth daily as needed (Sinus). (Patient not taking: Reported on 05/31/2021)    ? ?Facility-Administered Medications Prior to Visit  ?Medication Dose Route Frequency Provider Last Rate Last Admin  ? 0.9 %  sodium chloride infusion  500 mL Intravenous Once Rachael FeeJacobs, Daniel P, MD      ?  ? ?Allergies:   Other, Norvasc [amlodipine besylate], Levofloxacin, and Sulfonamide derivatives  ? ?Social History  ? ?Socioeconomic History  ? Marital status: Married  ?  Spouse name: Not on file  ? Number of children: 2  ? Years of education: Not on file  ? Highest education level: Not on file  ?Occupational History  ? Occupation: Education officer, museumwarehouse tech, Estate agentforklift operator, 3rd shift  ?  Employer: [employer]  ?Tobacco Use  ? Smoking status: Former  ?  Types: Cigarettes  ?  Quit date: 11/27/1973  ?  Years since quitting: 47.5  ? Smokeless tobacco: Never  ?Vaping Use  ? Vaping Use: Never used  ?Substance and Sexual Activity  ? Alcohol use: No  ? Drug use: No  ? Sexual activity: Not on file  ?Other Topics Concern  ? Not on file  ?Social History Narrative  ? Lives with wife, who is a Engineer, civil (consulting)nurse.  ? ?Social Determinants of Health  ? ?Financial Resource Strain: Not on file  ?Food Insecurity: Not on file  ?Transportation Needs: Not on file  ?Physical Activity: Not on file  ?Stress: Not on file  ?Social Connections: Not on file  ?  ? ?Family History:  The patient's family history includes COPD in his sister; Heart attack in his father and sister; Heart failure in his father; Hyperlipidemia in his brother and sister; Hypertension in his brother; Lymphoma in his brother; Ovarian cancer in his mother; Throat cancer (age of onset: 5668)  in his maternal uncle.  ? ?ROS:   ?Please see the history of present illness.   All other systems are reviewed and are negative.  ? ?PHYSICAL EXAM:   ?VS:  BP 128/88   Pulse 73   Ht 6\' 1"  (1.854 m)   Wt 253 lb (114.8 kg)   SpO2 97%   BMI 33.38 kg/m?    ? ? ?General: Alert, oriented x3, no distress, slight keloid formation at the left subclavian pacemaker site, but the area shows no evidence of redness, tenderness, swelling or pocket hematoma ?Head: no evidence of trauma, PERRL, EOMI, no exophtalmos or lid lag, no myxedema, no xanthelasma; normal ears, nose and oropharynx ?Neck: normal jugular venous pulsations and no hepatojugular reflux; brisk carotid pulses without delay and no carotid  bruits ?Chest: clear to auscultation, no signs of consolidation by percussion or palpation, normal fremitus, symmetrical and full respiratory excursions ?Cardiovascular: normal position and quality of the apical impulse, regular rhythm, normal first and second heart sounds, no murmurs, rubs or gallops ?Abdomen: no tenderness or distention, no masses by palpation, no abnormal pulsatility or arterial bruits, normal bowel sounds, no hepatosplenomegaly ?Extremities: no clubbing, cyanosis or edema; 2+ radial, ulnar and brachial pulses bilaterally; 2+ right femoral, posterior tibial and dorsalis pedis pulses; 2+ left femoral, posterior tibial and dorsalis pedis pulses; no subclavian or femoral bruits ?Neurological: grossly nonfocal ?Psych: Normal mood and affect ? ? ?Wt Readings from Last 3 Encounters:  ?05/31/21 253 lb (114.8 kg)  ?02/15/21 256 lb (116.1 kg)  ?11/11/20 253 lb (114.8 kg)  ?  ? ? ?Studies/Labs Reviewed:  ? ?EKG:  EKG is not ordered today.  Seen from 10/26/2020 shows atrial paced, ventricular sensed rhythm with a mildly prolonged QRS at 114 ms (incomplete right bundle branch block).  QTc 399 ms.  Today the intracardiac electrogram shows atrial paced, ventricular sensed rhythm ? ?Recent Labs: ?02/09/2021: BUN 20;  Creatinine, Ser 1.19; Hemoglobin 14.8; Platelets 200; Potassium 4.1; Sodium 141  ? ?Lipid Panel ?   ?Component Value Date/Time  ? CHOL 136 05/27/2020 0856  ? TRIG 112 05/27/2020 0856  ? HDL 32 (L) 05/27/2020 0856  ? CHOL

## 2021-05-31 NOTE — Patient Instructions (Signed)
Medication Instructions:  ?No changes ?*If you need a refill on your cardiac medications before your next appointment, please call your pharmacy* ? ? ?Lab Work: ?Your provider would like for you to have the following labs today: lipid ? ?If you have labs (blood work) drawn today and your tests are completely normal, you will receive your results only by: ?MyChart Message (if you have MyChart) OR ?A paper copy in the mail ?If you have any lab test that is abnormal or we need to change your treatment, we will call you to review the results. ? ? ?Testing/Procedures: ?None ordered ? ? ?Follow-Up: ?At [facility], you and your health needs are our priority.  As part of our continuing mission to provide you with exceptional heart care, we have created designated Provider Care Teams.  These Care Teams include your primary Cardiologist (physician) and Advanced Practice Providers (APPs -  Physician Assistants and Nurse Practitioners) who all work together to provide you with the care you need, when you need it. ? ?We recommend signing up for the patient portal called "MyChart".  Sign up information is provided on this After Visit Summary.  MyChart is used to connect with patients for Virtual Visits (Telemedicine).  Patients are able to view lab/test results, encounter notes, upcoming appointments, etc.  Non-urgent messages can be sent to your provider as well.   ?To learn more about what you can do with MyChart, go to [email or web address].   ? ?Your next appointment:   ?12 month(s) ? ?The format for your next appointment:   ?In Person ? ?Provider:   ?Sanda Klein, MD { ? ?Important Information About Sugar ? ? ? ? ? ? ?

## 2021-06-01 LAB — LIPID PANEL
Chol/HDL Ratio: 5.3 ratio — ABNORMAL HIGH (ref 0.0–5.0)
Cholesterol, Total: 159 mg/dL (ref 100–199)
HDL: 30 mg/dL — ABNORMAL LOW (ref >39)
LDL Chol Calc (NIH): 106 mg/dL — ABNORMAL HIGH (ref 0–99)
Triglycerides: 129 mg/dL (ref 0–149)
VLDL Cholesterol Cal: 23 mg/dL (ref 5–40)

## 2021-06-02 ENCOUNTER — Encounter: Payer: Self-pay | Admitting: *Deleted

## 2021-06-14 DIAGNOSIS — I872 Venous insufficiency (chronic) (peripheral): Secondary | ICD-10-CM | POA: Diagnosis not present

## 2021-06-14 DIAGNOSIS — B078 Other viral warts: Secondary | ICD-10-CM | POA: Diagnosis not present

## 2021-06-14 NOTE — Progress Notes (Signed)
Remote pacemaker transmission.   

## 2021-08-26 ENCOUNTER — Ambulatory Visit (INDEPENDENT_AMBULATORY_CARE_PROVIDER_SITE_OTHER): Payer: BC Managed Care – PPO

## 2021-08-26 DIAGNOSIS — Z950 Presence of cardiac pacemaker: Secondary | ICD-10-CM | POA: Diagnosis not present

## 2021-08-27 LAB — CUP PACEART REMOTE DEVICE CHECK
Battery Remaining Longevity: 164 mo
Battery Voltage: 3.15 V
Brady Statistic AP VP Percent: 0.03 %
Brady Statistic AP VS Percent: 69.74 %
Brady Statistic AS VP Percent: 0 %
Brady Statistic AS VS Percent: 30.22 %
Brady Statistic RA Percent Paced: 69.85 %
Brady Statistic RV Percent Paced: 0.04 %
Date Time Interrogation Session: 20230720163952
Implantable Lead Implant Date: 20120501
Implantable Lead Implant Date: 20120501
Implantable Lead Location: 753859
Implantable Lead Location: 753860
Implantable Pulse Generator Implant Date: 20230109
Lead Channel Impedance Value: 342 Ohm
Lead Channel Impedance Value: 399 Ohm
Lead Channel Impedance Value: 456 Ohm
Lead Channel Impedance Value: 494 Ohm
Lead Channel Pacing Threshold Amplitude: 0.75 V
Lead Channel Pacing Threshold Amplitude: 0.875 V
Lead Channel Pacing Threshold Pulse Width: 0.4 ms
Lead Channel Pacing Threshold Pulse Width: 0.4 ms
Lead Channel Sensing Intrinsic Amplitude: 15.625 mV
Lead Channel Sensing Intrinsic Amplitude: 15.625 mV
Lead Channel Sensing Intrinsic Amplitude: 2.25 mV
Lead Channel Sensing Intrinsic Amplitude: 2.25 mV
Lead Channel Setting Pacing Amplitude: 1.5 V
Lead Channel Setting Pacing Amplitude: 2 V
Lead Channel Setting Pacing Pulse Width: 0.4 ms
Lead Channel Setting Sensing Sensitivity: 0.9 mV

## 2021-09-13 ENCOUNTER — Telehealth: Payer: Self-pay | Admitting: *Deleted

## 2021-09-13 NOTE — Telephone Encounter (Signed)
Patient stated he was calling for an FYI for insurance/work purposes to say he tested positive for Covid on Friday 09/10/21. Patient states the symptoms started a few days before he tested but his symptoms were very mild and have now resoled other than a little runny nose. Patient is following work protocol for when he can return to work and will call back if he needs anything but currently is doing good with no issues.

## 2021-09-13 NOTE — Telephone Encounter (Signed)
Noted-we are here should he have any ongoing troubles

## 2021-09-16 ENCOUNTER — Telehealth: Payer: Self-pay | Admitting: Family Medicine

## 2021-09-16 ENCOUNTER — Encounter: Payer: Self-pay | Admitting: Family Medicine

## 2021-09-16 NOTE — Telephone Encounter (Signed)
May have a note as requested  CDC recommends that from 10 days from the beginning of COVID that we wear a mask when around others. So if it is still within that 10-day window when he returns to work he should wear a mask when around others

## 2021-09-16 NOTE — Telephone Encounter (Signed)
Pt wife calling in to get a note to return back to work on 09/18/21. Pt tested positive for COVID on 09/10/21. Pt states he is not having any symptoms. Pt did miss last weekend (8/5/-8/6) also. Pt states he is going to return on 09/18/21. Please advise. Thank you Wife will come by and pick up note.

## 2021-09-16 NOTE — Telephone Encounter (Signed)
Patient made aware and placed up front for pick up .

## 2021-09-17 NOTE — Progress Notes (Signed)
Remote pacemaker transmission.   

## 2021-10-21 DIAGNOSIS — R200 Anesthesia of skin: Secondary | ICD-10-CM | POA: Insufficient documentation

## 2021-10-21 DIAGNOSIS — M65312 Trigger thumb, left thumb: Secondary | ICD-10-CM | POA: Insufficient documentation

## 2021-10-21 DIAGNOSIS — M25532 Pain in left wrist: Secondary | ICD-10-CM | POA: Diagnosis not present

## 2021-11-03 ENCOUNTER — Ambulatory Visit
Admission: EM | Admit: 2021-11-03 | Discharge: 2021-11-03 | Disposition: A | Discharge status: Home / Self Care | Payer: BC Managed Care – PPO | Attending: Family Medicine | Admitting: Family Medicine

## 2021-11-03 DIAGNOSIS — N390 Urinary tract infection, site not specified: Secondary | ICD-10-CM | POA: Diagnosis not present

## 2021-11-03 DIAGNOSIS — Z20822 Contact with and (suspected) exposure to covid-19: Secondary | ICD-10-CM | POA: Diagnosis not present

## 2021-11-03 DIAGNOSIS — R6883 Chills (without fever): Secondary | ICD-10-CM | POA: Diagnosis not present

## 2021-11-03 LAB — RESP PANEL BY RT-PCR (FLU A&B, COVID) ARPGX2
Influenza A by PCR: NEGATIVE
Influenza B by PCR: NEGATIVE
SARS Coronavirus 2 by RT PCR: NEGATIVE

## 2021-11-03 LAB — POCT URINALYSIS DIP (MANUAL ENTRY)
Glucose, UA: NEGATIVE mg/dL
Nitrite, UA: POSITIVE — AB
Protein Ur, POC: 30 mg/dL — AB
Spec Grav, UA: 1.02 (ref 1.010–1.025)
Urobilinogen, UA: >=8 E.U./dL — AB
pH, UA: 7 (ref 5.0–8.0)

## 2021-11-03 MED ORDER — CEPHALEXIN 500 MG PO CAPS: 500.0000 mg | ORAL_CAPSULE | Freq: Two times a day (BID) | ORAL | 0 refills | Status: DC

## 2021-11-03 MED ORDER — ONDANSETRON 4 MG PO TBDP: 4.0000 mg | ORAL_TABLET | Freq: Three times a day (TID) | ORAL | 0 refills | Status: DC | PRN

## 2021-11-03 NOTE — ED Triage Notes (Signed)
Pt reports fever, headache, chills, body aches since last night;  increase urinary frequency x 5 days.

## 2021-11-05 ENCOUNTER — Other Ambulatory Visit: Payer: Self-pay

## 2021-11-05 ENCOUNTER — Emergency Department (HOSPITAL_COMMUNITY): Payer: BC Managed Care – PPO

## 2021-11-05 ENCOUNTER — Encounter (HOSPITAL_COMMUNITY): Payer: Self-pay | Admitting: Emergency Medicine

## 2021-11-05 ENCOUNTER — Observation Stay (HOSPITAL_COMMUNITY): Payer: BC Managed Care – PPO

## 2021-11-05 ENCOUNTER — Observation Stay (HOSPITAL_COMMUNITY)
Admission: EM | Admit: 2021-11-05 | Discharge: 2021-11-06 | Disposition: A | Discharge status: Home / Self Care | Payer: BC Managed Care – PPO | Attending: Family Medicine | Admitting: Family Medicine

## 2021-11-05 DIAGNOSIS — R112 Nausea with vomiting, unspecified: Secondary | ICD-10-CM

## 2021-11-05 DIAGNOSIS — N390 Urinary tract infection, site not specified: Principal | ICD-10-CM | POA: Diagnosis present

## 2021-11-05 DIAGNOSIS — Z20822 Contact with and (suspected) exposure to covid-19: Secondary | ICD-10-CM | POA: Insufficient documentation

## 2021-11-05 DIAGNOSIS — R519 Headache, unspecified: Secondary | ICD-10-CM | POA: Diagnosis not present

## 2021-11-05 DIAGNOSIS — Z79899 Other long term (current) drug therapy: Secondary | ICD-10-CM | POA: Insufficient documentation

## 2021-11-05 DIAGNOSIS — N281 Cyst of kidney, acquired: Secondary | ICD-10-CM | POA: Diagnosis not present

## 2021-11-05 DIAGNOSIS — I2510 Atherosclerotic heart disease of native coronary artery without angina pectoris: Secondary | ICD-10-CM | POA: Insufficient documentation

## 2021-11-05 DIAGNOSIS — Z87891 Personal history of nicotine dependence: Secondary | ICD-10-CM | POA: Insufficient documentation

## 2021-11-05 DIAGNOSIS — E860 Dehydration: Secondary | ICD-10-CM | POA: Diagnosis not present

## 2021-11-05 DIAGNOSIS — E785 Hyperlipidemia, unspecified: Secondary | ICD-10-CM | POA: Diagnosis not present

## 2021-11-05 DIAGNOSIS — N3000 Acute cystitis without hematuria: Secondary | ICD-10-CM | POA: Diagnosis not present

## 2021-11-05 DIAGNOSIS — Z7982 Long term (current) use of aspirin: Secondary | ICD-10-CM | POA: Insufficient documentation

## 2021-11-05 DIAGNOSIS — R1110 Vomiting, unspecified: Secondary | ICD-10-CM | POA: Diagnosis not present

## 2021-11-05 DIAGNOSIS — Z950 Presence of cardiac pacemaker: Secondary | ICD-10-CM | POA: Diagnosis not present

## 2021-11-05 DIAGNOSIS — R509 Fever, unspecified: Secondary | ICD-10-CM | POA: Diagnosis present

## 2021-11-05 LAB — COMPREHENSIVE METABOLIC PANEL
ALT: 41 U/L (ref 0–44)
AST: 29 U/L (ref 15–41)
Albumin: 3.5 g/dL (ref 3.5–5.0)
Alkaline Phosphatase: 54 U/L (ref 38–126)
Anion gap: 7 (ref 5–15)
BUN: 23 mg/dL (ref 8–23)
CO2: 22 mmol/L (ref 22–32)
Calcium: 9 mg/dL (ref 8.9–10.3)
Chloride: 106 mmol/L (ref 98–111)
Creatinine, Ser: 1.26 mg/dL — ABNORMAL HIGH (ref 0.61–1.24)
GFR, Estimated: >60 mL/min (ref >60)
Glucose, Bld: 122 mg/dL — ABNORMAL HIGH (ref 70–99)
Potassium: 3.9 mmol/L (ref 3.5–5.1)
Sodium: 135 mmol/L (ref 135–145)
Total Bilirubin: 1.3 mg/dL — ABNORMAL HIGH (ref 0.3–1.2)
Total Protein: 6.8 g/dL (ref 6.5–8.1)

## 2021-11-05 LAB — CBC WITH DIFFERENTIAL/PLATELET
Abs Immature Granulocytes: 0.04 10*3/uL (ref 0.00–0.07)
Basophils Absolute: 0 10*3/uL (ref 0.0–0.1)
Basophils Relative: 0 %
Eosinophils Absolute: 0 10*3/uL (ref 0.0–0.5)
Eosinophils Relative: 0 %
HCT: 40.7 % (ref 39.0–52.0)
Hemoglobin: 14 g/dL (ref 13.0–17.0)
Immature Granulocytes: 1 %
Lymphocytes Relative: 6 %
Lymphs Abs: 0.4 10*3/uL — ABNORMAL LOW (ref 0.7–4.0)
MCH: 33.3 pg (ref 26.0–34.0)
MCHC: 34.4 g/dL (ref 30.0–36.0)
MCV: 96.9 fL (ref 80.0–100.0)
Monocytes Absolute: 0.3 10*3/uL (ref 0.1–1.0)
Monocytes Relative: 4 %
Neutro Abs: 6.1 10*3/uL (ref 1.7–7.7)
Neutrophils Relative %: 89 %
Platelets: 101 10*3/uL — ABNORMAL LOW (ref 150–400)
RBC: 4.2 MIL/uL — ABNORMAL LOW (ref 4.22–5.81)
RDW: 13.5 % (ref 11.5–15.5)
WBC: 6.8 10*3/uL (ref 4.0–10.5)
nRBC: 0 % (ref 0.0–0.2)

## 2021-11-05 LAB — URINALYSIS, ROUTINE W REFLEX MICROSCOPIC
Bacteria, UA: NONE SEEN
Bilirubin Urine: NEGATIVE
Glucose, UA: NEGATIVE mg/dL
Hgb urine dipstick: NEGATIVE
Ketones, ur: NEGATIVE mg/dL
Leukocytes,Ua: NEGATIVE
Nitrite: NEGATIVE
Protein, ur: 100 mg/dL — AB
Specific Gravity, Urine: 1.028 (ref 1.005–1.030)
pH: 5 (ref 5.0–8.0)

## 2021-11-05 LAB — LIPASE, BLOOD: Lipase: 24 U/L (ref 11–51)

## 2021-11-05 LAB — URINE CULTURE: Culture: >=100000 — AB

## 2021-11-05 LAB — RESP PANEL BY RT-PCR (FLU A&B, COVID) ARPGX2
Influenza A by PCR: NEGATIVE
Influenza B by PCR: NEGATIVE
SARS Coronavirus 2 by RT PCR: NEGATIVE

## 2021-11-05 MED ORDER — DIPHENHYDRAMINE HCL 50 MG/ML IJ SOLN
25.0000 mg | Freq: Once | INTRAMUSCULAR | Status: AC
  Administered 2021-11-05: 25 mg via INTRAVENOUS
  Filled 2021-11-05: qty 1

## 2021-11-05 MED ORDER — ONDANSETRON HCL 4 MG/2ML IJ SOLN: 4.0000 mg | Freq: Four times a day (QID) | INTRAMUSCULAR | Status: DC | PRN

## 2021-11-05 MED ORDER — BUTALBITAL-APAP-CAFFEINE 50-325-40 MG PO TABS
2.0000 | ORAL_TABLET | Freq: Four times a day (QID) | ORAL | Status: AC | PRN
  Administered 2021-11-06: 2 via ORAL
  Filled 2021-11-05: qty 2

## 2021-11-05 MED ORDER — LACTATED RINGERS IV BOLUS
1000.0000 mL | Freq: Once | INTRAVENOUS | Status: AC
  Administered 2021-11-05: 1000 mL via INTRAVENOUS

## 2021-11-05 MED ORDER — PANTOPRAZOLE SODIUM 40 MG PO TBEC
40.0000 mg | DELAYED_RELEASE_TABLET | Freq: Every day | ORAL | Status: DC
  Administered 2021-11-05 – 2021-11-06 (×2): 40 mg via ORAL
  Filled 2021-11-05 (×2): qty 1

## 2021-11-05 MED ORDER — ASPIRIN 81 MG PO TBEC
81.0000 mg | DELAYED_RELEASE_TABLET | Freq: Every day | ORAL | Status: DC
  Administered 2021-11-05 – 2021-11-06 (×2): 81 mg via ORAL
  Filled 2021-11-05 (×2): qty 1

## 2021-11-05 MED ORDER — PROCHLORPERAZINE EDISYLATE 10 MG/2ML IJ SOLN
10.0000 mg | Freq: Once | INTRAMUSCULAR | Status: AC
  Administered 2021-11-05: 10 mg via INTRAVENOUS
  Filled 2021-11-05: qty 2

## 2021-11-05 MED ORDER — ACETAMINOPHEN 650 MG RE SUPP: 650.0000 mg | Freq: Four times a day (QID) | RECTAL | Status: DC | PRN

## 2021-11-05 MED ORDER — KETOROLAC TROMETHAMINE 15 MG/ML IJ SOLN
15.0000 mg | Freq: Four times a day (QID) | INTRAMUSCULAR | Status: DC | PRN
  Administered 2021-11-05 – 2021-11-06 (×2): 15 mg via INTRAVENOUS
  Filled 2021-11-05 (×2): qty 1

## 2021-11-05 MED ORDER — MAGNESIUM SULFATE IN D5W 1-5 GM/100ML-% IV SOLN
1.0000 g | Freq: Once | INTRAVENOUS | Status: AC
  Administered 2021-11-05: 1 g via INTRAVENOUS
  Filled 2021-11-05: qty 100

## 2021-11-05 MED ORDER — SODIUM CHLORIDE 0.9 % IV SOLN
1.0000 g | Freq: Once | INTRAVENOUS | Status: AC
  Administered 2021-11-05: 1 g via INTRAVENOUS
  Filled 2021-11-05: qty 10

## 2021-11-05 MED ORDER — LACTATED RINGERS IV SOLN: INTRAVENOUS | Status: DC

## 2021-11-05 MED ORDER — ACETAMINOPHEN 325 MG PO TABS: 650.0000 mg | ORAL_TABLET | Freq: Four times a day (QID) | ORAL | Status: DC | PRN

## 2021-11-05 MED ORDER — SODIUM CHLORIDE 0.9 % IV SOLN: 1.0000 g | Freq: Once | INTRAVENOUS | Status: DC

## 2021-11-05 MED ORDER — IOHEXOL 300 MG/ML  SOLN
100.0000 mL | Freq: Once | INTRAMUSCULAR | Status: AC | PRN
  Administered 2021-11-05: 100 mL via INTRAVENOUS

## 2021-11-05 MED ORDER — ONDANSETRON HCL 4 MG PO TABS: 4.0000 mg | ORAL_TABLET | Freq: Four times a day (QID) | ORAL | Status: DC | PRN

## 2021-11-05 MED ORDER — FENOFIBRATE 145 MG PO TABS
145.0000 mg | ORAL_TABLET | Freq: Every day | ORAL | Status: DC
  Filled 2021-11-05 (×3): qty 1

## 2021-11-05 MED ORDER — ENOXAPARIN SODIUM 40 MG/0.4ML IJ SOSY: 40.0000 mg | PREFILLED_SYRINGE | INTRAMUSCULAR | Status: DC

## 2021-11-05 MED ORDER — KETOROLAC TROMETHAMINE 15 MG/ML IJ SOLN
15.0000 mg | Freq: Once | INTRAMUSCULAR | Status: AC
  Administered 2021-11-05: 15 mg via INTRAVENOUS
  Filled 2021-11-05: qty 1

## 2021-11-05 NOTE — ED Provider Notes (Signed)
[facility] EMERGENCY DEPARTMENT Provider Note  CSN: [id number] Arrival date & time: 11/05/21 1136  Chief Complaint(s) Fever  HPI [name] is a 66 y.o. male with PMH Barrett's esophagus, GERD, HLD, pacemaker placement who presents emergency department for evaluation of dysuria, flank pain and fever.  Symptoms have been present for the last 1 week but significant worsened over the last 3 days.  He was seen at urgent care and diagnosed with E. coli UTI and discharged on Keflex.  The patient has had persistent nausea and has been unable to tolerate p.o. secondary to persistent vomiting.  Currently denies chest pain, shortness of breath, gross hematuria or other systemic symptoms.  He does endorse a headache today but denies numbness, tingling, weakness or other neurologic complaints.  Past Medical History Past Medical History:  Diagnosis Date   Allergy    seasonal allergies   Arthritis    LEFT shoulder, RIGHT elbow   Barrett's esophagus    GERD (gastroesophageal reflux disease)    on meds   Hypercholesterolemia    triglycerides elevated-on meds   Hypotension    Pacemaker    Syncope    Patient Active Problem List   Diagnosis Date Noted   Pacemaker battery depletion 02/15/2021   Bradycardia    Left wrist pain 03/04/2020   Screening for colon cancer 03/04/2020   Mild obesity 01/05/2016   Neurocardiogenic syncope 12/16/2012   OSA (obstructive sleep apnea) - presumptive diagnosis 12/16/2012   Dyspnea 11/09/2012   Acute prostatitis 05/04/2012   Chest pain 04/06/2011   Pacemaker, 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 Cr 1.7 on admission 04/06/2011   Dyslipidemia 04/06/2011   Family history of coronary artery disease 04/06/2011   Hx of cardiac cath in 2008 and April 2012 showing no significant CAD 04/06/2011   Home Medication(s) Prior to Admission medications   Medication Sig Start Date End Date Taking?  Authorizing Provider  aspirin EC 81 MG tablet Take 81 mg by mouth daily.    [provider]  cephALEXin (KEFLEX) 500 MG capsule Take 1 capsule (500 mg total) by mouth 2 (two) times daily. 11/03/21   Particia Nearing, PA-C  fenofibrate (TRICOR) 145 MG tablet TAKE 1 TABLET (145 MG TOTAL) BY MOUTH DAILY. KEEP OFFICE VISIT 11/23/20   Croitoru, Rachelle Hora, MD  glucosamine-chondroitin 500-400 MG tablet Take 1 tablet by mouth 3 (three) times daily.    [provider]  Multiple Vitamin (MULTIVITAMIN) tablet Take 1 tablet by mouth daily.    [provider]  Naphazoline HCl (CLEAR EYES OP) Place 2 drops into both eyes daily as needed (for dry eyes). Patient not taking: Reported on 05/31/2021    [provider]  omeprazole (PRILOSEC) 20 MG capsule TAKE 1 CAPSULE BY MOUTH EVERY DAY 03/09/18   Rachael Fee, MD  omeprazole (PRILOSEC) 20 MG capsule Take 1 capsule by mouth daily.    [provider]  ondansetron (ZOFRAN-ODT) 4 MG disintegrating tablet Take 1 tablet (4 mg total) by mouth every 8 (eight) hours as needed for nausea or vomiting. 11/03/21   Particia Nearing, PA-C  Pseudoephedrine HCl (SUDAFED 12 HOUR PO) Take 1 tablet by mouth daily as needed (Sinus). Patient not taking: Reported on 05/31/2021    [provider]  Past Surgical History Past Surgical History:  Procedure Laterality Date   birthmark removal     right thigh   BLADDER SURGERY     stretched   CARDIAC CATHETERIZATION  09/15/2006   Recommendation - empiric anti-reflux therapy   CARDIOVASCULAR STRESS TEST  04/08/2011   No scintigraphic evidence of inducible myocardial ischemia. No lexiscan EKG changes. Non-diagnostic for ischemia.   CERVICAL SPINE SURGERY     plates and screws   COLONOSCOPY  2011   DJ-F/V-moviprep(exc)TICS/HPP   LOWER EXTREMITY ARTERIAL  DOPPLER  10/02/2006   No evidence of thrombus or thrombphlebitis.   PACEMAKER INSERTION  06/08/2010   Medtronic Revo model #RVDR01, serial [id number]   POLYPECTOMY  2011   HPP   PPM GENERATOR CHANGEOUT N/A 02/15/2021   Procedure: PPM GENERATOR CHANGEOUT;  Surgeon: Thurmon Fair, MD;  Location: [facility] INVASIVE CV LAB;  Service: Cardiovascular;  Laterality: N/A;   TONSILLECTOMY     TRANSTHORACIC ECHOCARDIOGRAM  04/06/2011   EF 55-60%, normal   UPPER GASTROINTESTINAL ENDOSCOPY     WISDOM TOOTH EXTRACTION     Family History Family History  Problem Relation Age of Onset   Ovarian cancer Mother    Heart failure Father    Heart attack Father    Heart attack Sister    Lymphoma Brother        x 2   COPD Sister    Hypertension Brother    Hyperlipidemia Brother    Hyperlipidemia Sister    Throat cancer Maternal Uncle 37   Colon cancer Neg Hx    Stomach cancer Neg Hx    Colon polyps Neg Hx    Rectal cancer Neg Hx    Esophageal cancer Neg Hx     Social History Social History   Tobacco Use   Smoking status: Former    Types: Cigarettes    Quit date: 11/27/1973    Years since quitting: 47.9   Smokeless tobacco: Never  Vaping Use   Vaping Use: Never used  Substance Use Topics   Alcohol use: No   Drug use: No   Allergies Other, Norvasc [amlodipine besylate], Levofloxacin, and Sulfonamide derivatives  Review of Systems Review of Systems  Genitourinary:  Positive for dysuria and flank pain.  Neurological:  Positive for headaches.    Physical Exam Vital Signs  I have reviewed the triage vital signs BP 111/77 (BP Location: Left Arm)   Pulse 91   Temp 98.9 F (37.2 C) (Oral)   Resp 17   Ht 6\' 1"  (1.854 m)   Wt 114.8 kg   SpO2 96%   BMI 33.38 kg/m   Physical Exam Constitutional:      General: He is not in acute distress.    Appearance: Normal appearance.  HENT:     Head: Normocephalic and atraumatic.     Nose: No congestion or rhinorrhea.  Eyes:     General:         Right eye: No discharge.        Left eye: No discharge.     Extraocular Movements: Extraocular movements intact.     Pupils: Pupils are equal, round, and reactive to light.  Cardiovascular:     Rate and Rhythm: Normal rate and regular rhythm.     Heart sounds: No murmur heard. Pulmonary:     Effort: No respiratory distress.     Breath sounds: No wheezing or rales.  Abdominal:     General: There is no distension.     Tenderness:  There is no abdominal tenderness. There is right CVA tenderness.  Musculoskeletal:        General: Normal range of motion.     Cervical back: Normal range of motion.  Skin:    General: Skin is warm and dry.  Neurological:     General: No focal deficit present.     Mental Status: He is alert.     ED Results and Treatments Labs (all labs ordered are listed, but only abnormal results are displayed) Labs Reviewed  URINE CULTURE  CBC WITH DIFFERENTIAL/PLATELET  COMPREHENSIVE METABOLIC PANEL  LIPASE, BLOOD  URINALYSIS, ROUTINE W REFLEX MICROSCOPIC                                                                                                                          Radiology No results found.  Pertinent labs & imaging results that were available during my care of the patient were reviewed by me and considered in my medical decision making (see MDM for details).  Medications Ordered in ED Medications  prochlorperazine (COMPAZINE) injection 10 mg (10 mg Intravenous Given 11/05/21 1311)  diphenhydrAMINE (BENADRYL) injection 25 mg (25 mg Intravenous Given 11/05/21 1312)  lactated ringers bolus 1,000 mL (1,000 mLs Intravenous New Bag/Given 11/05/21 1314)                                                                                                                                     Procedures Procedures  (including critical care time)  Medical Decision Making / ED Course   This patient presents to the ED for concern of flank pain, headache, dysuria,  this involves an extensive number of treatment options, and is a complaint that carries with it a high risk of complications and morbidity.  The differential diagnosis includes UTI, pyelonephritis, septic stone, nephrolithiasis  MDM: Patient seen emergency room for evaluation of flank pain, dysuria, headache, vomiting.  Physical exam with right CVA tenderness is otherwise unremarkable.  Neurologic exam unremarkable.  Laboratory evaluation largely unremarkable outside of a mild creatinine elevation to 1.26.  Urinalysis with 21-50 white blood cells, 6-10 red blood cells but negative nitrite and leuk esterase.  Suspect the beginnings of sterile pyuria as the patient is tolerating some of his Keflex but not all of it due to persistent nausea vomiting.  CT abdomen pelvis unremarkable.  Patient was given headache cocktail with only minimal improvement of  his headache.  On reevaluation, he still feels quite poorly and is persistently nauseous.  Patient would benefit from a IV antibiotics and overnight hospital observation for inability to tolerate home oral antibiotics.  Patient then admitted to the hospitalist.  Additional history obtained: -Additional history obtained from wife -External records from outside source obtained and reviewed including: Chart review including previous notes, labs, imaging, consultation notes   Lab Tests: -I ordered, reviewed, and interpreted labs.   The pertinent results include:   Labs Reviewed  URINE CULTURE  CBC WITH DIFFERENTIAL/PLATELET  COMPREHENSIVE METABOLIC PANEL  LIPASE, BLOOD  URINALYSIS, ROUTINE W REFLEX MICROSCOPIC      EKG   EKG Interpretation  Date/Time:    Ventricular Rate:    PR Interval:    QRS Duration:   QT Interval:    QTC Calculation:   R Axis:     Text Interpretation:           Imaging Studies ordered: I ordered imaging studies including CT abdomen pelvis I independently visualized and interpreted imaging. I agree with the  radiologist interpretation   Medicines ordered and prescription drug management: Meds ordered this encounter  Medications   prochlorperazine (COMPAZINE) injection 10 mg   diphenhydrAMINE (BENADRYL) injection 25 mg   lactated ringers bolus 1,000 mL    -I have reviewed the patients home medicines and have made adjustments as needed  Critical interventions none  Cardiac Monitoring: The patient was maintained on a cardiac monitor.  I personally viewed and interpreted the cardiac monitored which showed an underlying rhythm of: NSR  Social Determinants of Health:  Factors impacting patients care include: none   Reevaluation: After the interventions noted above, I reevaluated the patient and found that they have :stayed the same  Co morbidities that complicate the patient evaluation  Past Medical History:  Diagnosis Date   Allergy    seasonal allergies   Arthritis    LEFT shoulder, RIGHT elbow   Barrett's esophagus    GERD (gastroesophageal reflux disease)    on meds   Hypercholesterolemia    triglycerides elevated-on meds   Hypotension    Pacemaker    Syncope       Dispostion: I considered admission for this patient, and due to urinary tract infection with inability to tolerate oral antibiotics, patient require hospital admission     Final Clinical Impression(s) / ED Diagnoses Final diagnoses:  None     @PCDICTATION @    Teressa Lower, MD 11/05/21 [phone]

## 2021-11-05 NOTE — H&P (Addendum)
TR[facility] H&P   Patient Demographics:    [name], is a 66 y.o. male  MRN: [id number]   DOB - 1955/04/18  Admit Date - 11/05/2021  Outpatient Primary MD for the patient is Babs Sciara, MD  Referring MD/NP/PA: Dr Audrie Lia  Patient coming from: Home  Chief Complaint  Patient presents with   Fever      HPI:    [name]  is a 66 y.o. male, with past medical history of Barrett's esophagus, GERD, hyperlipidemia, pacemaker placement, patient presents to ED secondary to complaints of nausea and vomiting, [name] does report flank pain, fever and dysuria, reports symptom has been going on for 1 week, worsened over last few days, for which she has been seen by urgent care, where he was diagnosed with E. coli UTI, he was discharged on Keflex, but patient reports he has been having persistent nausea and vomiting, he was only able to tolerate 1 tablet so far, tolerate any further medications due to the persistent nausea and vomiting, he reports generalized weakness, fatigue, but he denies any focal deficits, he does complains of headache started today as well. -In ED patient was afebrile, white blood cell count was 6.8, platelet count was 101, 18 at 1.26, his urine analysis was significant for pyuria, [facility] hospitalist consulted to admit   Review of systems:   A full 10 point Review of Systems was done, except as stated above, all other Review of Systems were negative.   With Past History of the following :    Past Medical History:  Diagnosis Date   Allergy    seasonal allergies   Arthritis    LEFT shoulder, RIGHT elbow   Barrett's esophagus    GERD (gastroesophageal reflux disease)    on meds   Hypercholesterolemia    triglycerides elevated-on meds   Hypotension    Pacemaker    Syncope       Past Surgical History:  Procedure Laterality Date   birthmark removal     right  thigh   BLADDER SURGERY     stretched   CARDIAC CATHETERIZATION  09/15/2006   Recommendation - empiric anti-reflux therapy   CARDIOVASCULAR STRESS TEST  04/08/2011   No scintigraphic evidence of inducible myocardial ischemia. No lexiscan EKG changes. Non-diagnostic for ischemia.   CERVICAL SPINE SURGERY     plates and screws   COLONOSCOPY  2011   DJ-F/V-moviprep(exc)TICS/HPP   LOWER EXTREMITY ARTERIAL DOPPLER  10/02/2006   No evidence of thrombus or thrombphlebitis.   PACEMAKER INSERTION  06/08/2010   Medtronic Revo model #RVDR01, serial [id number]   POLYPECTOMY  2011   HPP   PPM GENERATOR CHANGEOUT N/A 02/15/2021   Procedure: PPM GENERATOR CHANGEOUT;  Surgeon: Thurmon Fair, MD;  Location: [facility] INVASIVE CV LAB;  Service: Cardiovascular;  Laterality: N/A;   TONSILLECTOMY     TRANSTHORACIC ECHOCARDIOGRAM  04/06/2011   EF  55-60%, normal   UPPER GASTROINTESTINAL ENDOSCOPY     WISDOM TOOTH EXTRACTION        Social History:     Social History   Tobacco Use   Smoking status: Former    Types: Cigarettes    Quit date: 11/27/1973    Years since quitting: 47.9   Smokeless tobacco: Never  Substance Use Topics   Alcohol use: No       Family History :     Family History  Problem Relation Age of Onset   Ovarian cancer Mother    Heart failure Father    Heart attack Father    Heart attack Sister    Lymphoma Brother        x 2   COPD Sister    Hypertension Brother    Hyperlipidemia Brother    Hyperlipidemia Sister    Throat cancer Maternal Uncle 24   Colon cancer Neg Hx    Stomach cancer Neg Hx    Colon polyps Neg Hx    Rectal cancer Neg Hx    Esophageal cancer Neg Hx     Home Medications:   Prior to Admission medications   Medication Sig Start Date End Date Taking? Authorizing Provider  aspirin EC 81 MG tablet Take 81 mg by mouth daily.   Yes [provider]  cephALEXin (KEFLEX) 500 MG capsule Take 1 capsule (500 mg total) by mouth 2 (two) times daily. 11/03/21   Yes Particia Nearing, PA-C  fenofibrate (TRICOR) 145 MG tablet TAKE 1 TABLET (145 MG TOTAL) BY MOUTH DAILY. KEEP OFFICE VISIT 11/23/20  Yes Croitoru, Mihai, MD  glucosamine-chondroitin 500-400 MG tablet Take 1 tablet by mouth 3 (three) times daily.   Yes [provider]  Multiple Vitamin (MULTIVITAMIN) tablet Take 1 tablet by mouth daily.   Yes [provider]  omeprazole (PRILOSEC) 20 MG capsule TAKE 1 CAPSULE BY MOUTH EVERY DAY 03/09/18  Yes Rachael Fee, MD  ondansetron (ZOFRAN-ODT) 4 MG disintegrating tablet Take 1 tablet (4 mg total) by mouth every 8 (eight) hours as needed for nausea or vomiting. 11/03/21  Yes Particia Nearing, PA-C  Pseudoephedrine HCl (SUDAFED 12 HOUR PO) Take 1 tablet by mouth daily as needed (Sinus).   Yes [provider]  Naphazoline HCl (CLEAR EYES OP) Place 2 drops into both eyes daily as needed (for dry eyes). Patient not taking: Reported on 05/31/2021    [provider]     Allergies:     Allergies  Allergen Reactions   Other Anaphylaxis    GI Cocktail Chinese food- causes upset stomach and vomiting   Norvasc [Amlodipine Besylate] Swelling   Levofloxacin Itching and Rash    Other reaction(s): Not available   Sulfonamide Derivatives Swelling    REACTION: tongue swells     Physical Exam:   Vitals  Blood pressure (!) 105/55, pulse 73, temperature 99.5 F (37.5 C), temperature source Oral, resp. rate 18, height 6\' 1"  (1.854 m), weight 114.8 kg, SpO2 99 %.   1. General well-developed male, laying in bed, in mild discomfort secondary to headache  2. Normal affect and insight, Not Suicidal or Homicidal, Awake Alert, Oriented X 3.  3. No F.N deficits, ALL C.Nerves Intact, Strength 5/5 all 4 extremities, Sensation intact all 4 extremities, Plantars down going.  4. Ears and Eyes appear Normal, Conjunctivae clear, PERRLA. Moist Oral Mucosa.  5. Supple Neck, No JVD, No cervical lymphadenopathy appriciated,  No Carotid Bruits.  6. Symmetrical Chest  wall movement, Good air movement bilaterally, CTAB.  7. RRR, No Gallops, Rubs or Murmurs, No Parasternal Heave.  8. Positive Bowel Sounds, Abdomen Soft, No tenderness, No organomegaly appriciated,No rebound -guarding or rigidity.  9.  No Cyanosis, Normal Skin Turgor, No Skin Rash or Bruise.  10. Good muscle tone,  joints appear normal , no effusions, Normal ROM.     Data Review:    CBC Recent Labs  Lab 11/05/21 1250  WBC 6.8  HGB 14.0  HCT 40.7  PLT 101*  MCV 96.9  MCH 33.3  MCHC 34.4  RDW 13.5  LYMPHSABS 0.4*  MONOABS 0.3  EOSABS 0.0  BASOSABS 0.0   ------------------------------------------------------------------------------------------------------------------  Chemistries  Recent Labs  Lab 11/05/21 1250  NA 135  K 3.9  CL 106  CO2 22  GLUCOSE 122*  BUN 23  CREATININE 1.26*  CALCIUM 9.0  AST 29  ALT 41  ALKPHOS 54  BILITOT 1.3*   ------------------------------------------------------------------------------------------------------------------ estimated creatinine clearance is 78.7 mL/min (A) (by C-G formula based on SCr of 1.26 mg/dL (H)). ------------------------------------------------------------------------------------------------------------------ No results for input(s): "TSH", "T4TOTAL", "T3FREE", "THYROIDAB" in the last 72 hours.  Invalid input(s): "FREET3"  Coagulation profile No results for input(s): "INR", "PROTIME" in the last 168 hours. ------------------------------------------------------------------------------------------------------------------- No results for input(s): "DDIMER" in the last 72 hours. -------------------------------------------------------------------------------------------------------------------  Cardiac Enzymes No results for input(s): "CKMB", "TROPONINI", "MYOGLOBIN" in the last 168 hours.  Invalid input(s):  "CK" ------------------------------------------------------------------------------------------------------------------ No results found for: "BNP"   ---------------------------------------------------------------------------------------------------------------  Urinalysis    Component Value Date/Time   COLORURINE AMBER (A) 11/05/2021 1338   APPEARANCEUR HAZY (A) 11/05/2021 1338   LABSPEC 1.028 11/05/2021 1338   PHURINE 5.0 11/05/2021 1338   GLUCOSEU NEGATIVE 11/05/2021 1338   HGBUR NEGATIVE 11/05/2021 1338   BILIRUBINUR NEGATIVE 11/05/2021 1338   BILIRUBINUR small (A) 11/03/2021 1641   KETONESUR NEGATIVE 11/05/2021 1338   PROTEINUR 100 (A) 11/05/2021 1338   UROBILINOGEN >=8.0 (A) 11/03/2021 1641   UROBILINOGEN 1.0 02/13/2014 0424   NITRITE NEGATIVE 11/05/2021 1338   LEUKOCYTESUR NEGATIVE 11/05/2021 1338    ----------------------------------------------------------------------------------------------------------------   Imaging Results:    CT ABDOMEN PELVIS W CONTRAST  Result Date: 11/05/2021 CLINICAL DATA:  Fever, chills, vomiting and recent diagnosis of urinary tract infection. EXAM: CT ABDOMEN AND PELVIS WITH CONTRAST TECHNIQUE: Multidetector CT imaging of the abdomen and pelvis was performed using the standard protocol following bolus administration of intravenous contrast. RADIATION DOSE REDUCTION: This exam was performed according to the departmental dose-optimization program which includes automated exposure control, adjustment of the mA and/or kV according to patient size and/or use of iterative reconstruction technique. CONTRAST:  OMNIPAQUE IOHEXOL 300 MG/ML  SOLN COMPARISON:  02/13/2014 FINDINGS: Lower chest: No acute abnormality. Hepatobiliary: No focal liver abnormality is seen. No gallstones, gallbladder wall thickening, or biliary dilatation. Pancreas: Unremarkable. No pancreatic ductal dilatation or surrounding inflammatory changes. Spleen: Mild splenomegaly.  Adrenals/Urinary Tract: No adrenal masses. No evidence of hydronephrosis, renal calculi, renal lesions or inflammatory process. Small cortical cysts of the right kidney have a benign appearance with the largest measuring 14 mm. These require no follow-up. No evidence to suggest focal pyelonephritis or renal abscess. The bladder is unremarkable. Stomach/Bowel: Bowel shows no evidence of obstruction, ileus, inflammation or lesion. The appendix is normal. No free intraperitoneal air. Vascular/Lymphatic: Atherosclerosis of the abdominal aorta without aneurysm. No enlarged lymph nodes identified. Reproductive: Prostate is unremarkable. Other: Small left inguinal hernia containing fat. No abdominopelvic ascites. Musculoskeletal: No acute or significant osseous findings. IMPRESSION: 1. No acute  findings in the abdomen or pelvis. 2. Mild splenomegaly. 3. Atherosclerosis of the abdominal aorta. 4. Small left inguinal hernia containing fat. Electronically Signed   By: Aletta Edouard M.D.   On: 11/05/2021 15:09      Assessment & Plan:    Principal Problem:   UTI (urinary tract infection) Active Problems:   Dyslipidemia   Pacemaker, MDT 5/12 for SB   Dehydration  UTI -Patient with recent diagnosis of UTI at urgent care, with urine culture growing pansensitive E. coli, but intolerant to oral Keflex due to significant nausea and vomiting. -He will be admitted to medical floor, will keep on on IV Rocephin, now on blood cultures and new urine culture sent while in ED. -Patient denies any prostatic symptoms, but he is with history of acute prostatitis, so we will treat for prolonged antibiotic course 7 to 10 days presumed complicated UTI  Nausea and vomiting -Most likely due to above, no acute finding on CT abdomen pelvis, continue with as needed nausea medications  Headache -CT head was obtained given significant vomiting, no acute finding on CT head -Will try Fioricet  Hyperlipidemia -Continue with  Tricor  GERD -Continue with PPI  Status post pacemaker for sinus bradycardia     DVT Prophylaxis Lovenox   AM Labs Ordered, also please review Full Orders  Family Communication: Admission, patients condition and plan of care including tests being ordered have been discussed with the patient and wife at bedside who indicate understanding and agree with the plan and Code Status.  Code Status Full  Likely DC to  home  Condition GUARDED    Consults called: none    Admission status: Observation    Time spent in minutes : 55 minutes   Phillips Climes M.D on 11/05/2021 at 5:06 PM   [facility] - Office  [phone]

## 2021-11-05 NOTE — ED Triage Notes (Signed)
Pt reports fever (up to 102), headache, chills, and emesis x 3 days, pt was recently diagnosed with UTI and Rx'ed keflex; pt was tested for Covid at [facility] and again at home with both tests being negative

## 2021-11-05 NOTE — ED Notes (Signed)
Patient transported to CT 

## 2021-11-06 DIAGNOSIS — Z950 Presence of cardiac pacemaker: Secondary | ICD-10-CM | POA: Diagnosis not present

## 2021-11-06 DIAGNOSIS — E860 Dehydration: Secondary | ICD-10-CM | POA: Diagnosis not present

## 2021-11-06 DIAGNOSIS — N3000 Acute cystitis without hematuria: Secondary | ICD-10-CM | POA: Diagnosis not present

## 2021-11-06 DIAGNOSIS — E785 Hyperlipidemia, unspecified: Secondary | ICD-10-CM | POA: Diagnosis not present

## 2021-11-06 LAB — CBC
HCT: 37.3 % — ABNORMAL LOW (ref 39.0–52.0)
Hemoglobin: 12.8 g/dL — ABNORMAL LOW (ref 13.0–17.0)
MCH: 33 pg (ref 26.0–34.0)
MCHC: 34.3 g/dL (ref 30.0–36.0)
MCV: 96.1 fL (ref 80.0–100.0)
Platelets: 106 10*3/uL — ABNORMAL LOW (ref 150–400)
RBC: 3.88 MIL/uL — ABNORMAL LOW (ref 4.22–5.81)
RDW: 13.6 % (ref 11.5–15.5)
WBC: 2.8 10*3/uL — ABNORMAL LOW (ref 4.0–10.5)
nRBC: 0 % (ref 0.0–0.2)

## 2021-11-06 LAB — BASIC METABOLIC PANEL
Anion gap: 6 (ref 5–15)
BUN: 24 mg/dL — ABNORMAL HIGH (ref 8–23)
CO2: 23 mmol/L (ref 22–32)
Calcium: 8.7 mg/dL — ABNORMAL LOW (ref 8.9–10.3)
Chloride: 106 mmol/L (ref 98–111)
Creatinine, Ser: 1.26 mg/dL — ABNORMAL HIGH (ref 0.61–1.24)
GFR, Estimated: >60 mL/min (ref >60)
Glucose, Bld: 115 mg/dL — ABNORMAL HIGH (ref 70–99)
Potassium: 3.8 mmol/L (ref 3.5–5.1)
Sodium: 135 mmol/L (ref 135–145)

## 2021-11-06 LAB — HIV ANTIBODY (ROUTINE TESTING W REFLEX): HIV Screen 4th Generation wRfx: NONREACTIVE

## 2021-11-06 LAB — URINE CULTURE: Culture: NO GROWTH

## 2021-11-06 MED ORDER — CEFDINIR 300 MG PO CAPS
300.0000 mg | ORAL_CAPSULE | Freq: Two times a day (BID) | ORAL | Status: DC
  Administered 2021-11-06: 300 mg via ORAL
  Filled 2021-11-06: qty 1

## 2021-11-06 MED ORDER — CEFDINIR 300 MG PO CAPS: 300.0000 mg | ORAL_CAPSULE | Freq: Every day | ORAL | 0 refills | Status: DC

## 2021-11-06 MED ORDER — ACETAMINOPHEN 325 MG PO TABS: 650.0000 mg | ORAL_TABLET | Freq: Four times a day (QID) | ORAL | Status: DC | PRN

## 2021-11-06 MED ORDER — SODIUM CHLORIDE 0.9 % IV SOLN: 2.0000 g | Freq: Once | INTRAVENOUS | Status: DC

## 2021-11-06 MED ORDER — CEFTRIAXONE SODIUM 1 G IJ SOLR
1.0000 g | Freq: Once | INTRAMUSCULAR | Status: DC
  Filled 2021-11-06: qty 10

## 2021-11-06 MED ORDER — FENOFIBRATE 160 MG PO TABS
160.0000 mg | ORAL_TABLET | Freq: Every day | ORAL | Status: DC
  Administered 2021-11-06: 160 mg via ORAL
  Filled 2021-11-06: qty 1

## 2021-11-06 NOTE — Discharge Instructions (Signed)
IMPORTANT INFORMATION: PAY CLOSE ATTENTION   PHYSICIAN DISCHARGE INSTRUCTIONS  Follow with Primary care provider  Luking, Scott A, MD  and other consultants as instructed by your Hospitalist Physician  SEEK MEDICAL CARE OR RETURN TO EMERGENCY ROOM IF SYMPTOMS COME BACK, WORSEN OR NEW PROBLEM DEVELOPS   Please note: You were cared for by a hospitalist during your hospital stay. Every effort will be made to forward records to your primary care provider.  You can request that your primary care provider send for your hospital records if they have not received them.  Once you are discharged, your primary care physician will handle any further medical issues. Please note that NO REFILLS for any discharge medications will be authorized once you are discharged, as it is imperative that you return to your primary care physician (or establish a relationship with a primary care physician if you do not have one) for your post hospital discharge needs so that they can reassess your need for medications and monitor your lab values.  Please get a complete blood count and chemistry panel checked by your Primary MD at your next visit, and again as instructed by your Primary MD.  Get Medicines reviewed and adjusted: Please take all your medications with you for your next visit with your Primary MD  Laboratory/radiological data: Please request your Primary MD to go over all hospital tests and procedure/radiological results at the follow up, please ask your primary care provider to get all Hospital records sent to his/her office.  In some cases, they will be blood work, cultures and biopsy results pending at the time of your discharge. Please request that your primary care provider follow up on these results.  If you are diabetic, please bring your blood sugar readings with you to your follow up appointment with primary care.    Please call and make your follow up appointments as soon as possible.    Also Note  the following: If you experience worsening of your admission symptoms, develop shortness of breath, life threatening emergency, suicidal or homicidal thoughts you must seek medical attention immediately by calling 911 or calling your MD immediately  if symptoms less severe.  You must read complete instructions/literature along with all the possible adverse reactions/side effects for all the Medicines you take and that have been prescribed to you. Take any new Medicines after you have completely understood and accpet all the possible adverse reactions/side effects.   Do not drive when taking Pain medications or sleeping medications (Benzodiazepines)  Do not take more than prescribed Pain, Sleep and Anxiety Medications. It is not advisable to combine anxiety,sleep and pain medications without talking with your primary care practitioner  Special Instructions: If you have smoked or chewed Tobacco  in the last 2 yrs please stop smoking, stop any regular Alcohol  and or any Recreational drug use.  Wear Seat belts while driving.  Do not drive if taking any narcotic, mind altering or controlled substances or recreational drugs or alcohol.       

## 2021-11-06 NOTE — TOC Progression Note (Signed)
  Transition of Care (TOC) Screening Note   Patient Details  Name: [name] Date of Birth: 1955/09/01   Transition of Care [facility]) CM/SW Contact:    Boneta Lucks, RN Phone Number: 11/06/2021, 12:10 PM    Transition of Care Department [facility]) has reviewed patient and no TOC needs have been identified at this time. We will continue to monitor patient advancement through interdisciplinary progression rounds. If new patient transition needs arise, please place a TOC consult.   Expected Discharge Plan and Services        Home   Expected Discharge Date: 11/06/21

## 2021-11-06 NOTE — ED Provider Notes (Signed)
[facility]    CSN: [id number] Arrival date & time: 11/03/21  1614      History   Chief Complaint Chief Complaint  Patient presents with   Fever   Headache   Chills         HPI [name] is a 66 y.o. male.   Patient presenting today with about 5 days of fever, chills, body aches, headache and now increased urinary frequency the last few days.  Denies severe abdominal pain, flank pain, chest pain, shortness of breath, nausea vomiting or diarrhea.  So far taking over-the-counter pain relievers with mild temporary relief of symptoms.  No past history of similar issues.    Past Medical History:  Diagnosis Date   Allergy    seasonal allergies   Arthritis    LEFT shoulder, RIGHT elbow   Barrett's esophagus    GERD (gastroesophageal reflux disease)    on meds   Hypercholesterolemia    triglycerides elevated-on meds   Hypotension    Pacemaker    Syncope     Patient Active Problem List   Diagnosis Date Noted   UTI (urinary tract infection) 11/05/2021   Pacemaker battery depletion 02/15/2021   Bradycardia    Left wrist pain 03/04/2020   Screening for colon cancer 03/04/2020   Mild obesity 01/05/2016   Neurocardiogenic syncope 12/16/2012   OSA (obstructive sleep apnea) - presumptive diagnosis 12/16/2012   Dyspnea 11/09/2012   Acute prostatitis 05/04/2012   Chest pain 04/06/2011   Pacemaker, 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 Cr 1.7 on admission 04/06/2011   Dyslipidemia 04/06/2011   Family history of coronary artery disease 04/06/2011   Hx of cardiac cath in 2008 and April 2012 showing no significant CAD 04/06/2011    Past Surgical History:  Procedure Laterality Date   birthmark removal     right thigh   BLADDER SURGERY     stretched   CARDIAC CATHETERIZATION  09/15/2006   Recommendation - empiric anti-reflux therapy   CARDIOVASCULAR STRESS TEST  04/08/2011   No scintigraphic  evidence of inducible myocardial ischemia. No lexiscan EKG changes. Non-diagnostic for ischemia.   CERVICAL SPINE SURGERY     plates and screws   COLONOSCOPY  2011   DJ-F/V-moviprep(exc)TICS/HPP   LOWER EXTREMITY ARTERIAL DOPPLER  10/02/2006   No evidence of thrombus or thrombphlebitis.   PACEMAKER INSERTION  06/08/2010   Medtronic Revo model #RVDR01, serial [id number]   POLYPECTOMY  2011   HPP   PPM GENERATOR CHANGEOUT N/A 02/15/2021   Procedure: PPM GENERATOR CHANGEOUT;  Surgeon: Sanda Klein, MD;  Location: [facility];  Service: Cardiovascular;  Laterality: N/A;   TONSILLECTOMY     TRANSTHORACIC ECHOCARDIOGRAM  04/06/2011   EF 55-60%, normal   UPPER GASTROINTESTINAL ENDOSCOPY     WISDOM TOOTH EXTRACTION         Home Medications    Prior to Admission medications   Medication Sig Start Date End Date Taking? Authorizing Provider  ondansetron (ZOFRAN-ODT) 4 MG disintegrating tablet Take 1 tablet (4 mg total) by mouth every 8 (eight) hours as needed for nausea or vomiting. 11/03/21  Yes Volney American, PA-C  acetaminophen (TYLENOL) 325 MG tablet Take 2 tablets (650 mg total) by mouth every 6 (six) hours as needed for mild pain, fever or headache (or Fever >/= 101). 11/06/21   Johnson, Clanford L, MD  aspirin EC 81 MG tablet Take 81 mg by mouth  daily.    [provider]  cefdinir (OMNICEF) 300 MG capsule Take 1 capsule (300 mg total) by mouth daily. 11/07/21   Johnson, Clanford L, MD  fenofibrate (TRICOR) 145 MG tablet TAKE 1 TABLET (145 MG TOTAL) BY MOUTH DAILY. KEEP OFFICE VISIT 11/23/20   Croitoru, Dani Gobble, MD  glucosamine-chondroitin 500-400 MG tablet Take 1 tablet by mouth 3 (three) times daily.    [provider]  Multiple Vitamin (MULTIVITAMIN) tablet Take 1 tablet by mouth daily.    [provider]  omeprazole (PRILOSEC) 20 MG capsule TAKE 1 CAPSULE BY MOUTH EVERY DAY 03/09/18   Milus Banister, MD  Pseudoephedrine HCl (SUDAFED 12 HOUR PO)  Take 1 tablet by mouth daily as needed (Sinus).    [provider]    Family History Family History  Problem Relation Age of Onset   Ovarian cancer Mother    Heart failure Father    Heart attack Father    Heart attack Sister    Lymphoma Brother        x 2   COPD Sister    Hypertension Brother    Hyperlipidemia Brother    Hyperlipidemia Sister    Throat cancer Maternal Uncle 49   Colon cancer Neg Hx    Stomach cancer Neg Hx    Colon polyps Neg Hx    Rectal cancer Neg Hx    Esophageal cancer Neg Hx     Social History Social History   Tobacco Use   Smoking status: Former    Types: Cigarettes    Quit date: 11/27/1973    Years since quitting: 47.9   Smokeless tobacco: Never  Vaping Use   Vaping Use: Never used  Substance Use Topics   Alcohol use: No   Drug use: No     Allergies   Other, Norvasc [amlodipine besylate], Levofloxacin, and Sulfonamide derivatives   Review of Systems Review of Systems Per HPI  Physical Exam Triage Vital Signs ED Triage Vitals  Enc Vitals Group     BP 11/03/21 1631 132/88     Pulse Rate 11/03/21 1631 98     Resp 11/03/21 1631 18     Temp 11/03/21 1631 99.8 F (37.7 C)     Temp Source 11/03/21 1631 Oral     SpO2 11/03/21 1631 96 %     Weight --      Height --      Head Circumference --      Peak Flow --      Pain Score 11/03/21 1627 7     Pain Loc --      Pain Edu? --      Excl. in [place]? --    No data found.  Updated Vital Signs BP 132/88 (BP Location: Right Arm)   Pulse 98   Temp 99.8 F (37.7 C) (Oral)   Resp 18   SpO2 96%   Visual Acuity Right Eye Distance:   Left Eye Distance:   Bilateral Distance:    Right Eye Near:   Left Eye Near:    Bilateral Near:     Physical Exam Vitals and nursing note reviewed.  Constitutional:      Appearance: Normal appearance.  HENT:     Head: Atraumatic.     Mouth/Throat:     Mouth: Mucous membranes are moist.  Eyes:     Extraocular Movements: Extraocular  movements intact.     Conjunctiva/sclera: Conjunctivae normal.  Cardiovascular:     Rate and  Rhythm: Normal rate and regular rhythm.  Pulmonary:     Effort: Pulmonary effort is normal.     Breath sounds: Normal breath sounds.  Abdominal:     General: Bowel sounds are normal. There is no distension.     Palpations: Abdomen is soft.     Tenderness: There is no abdominal tenderness. There is no right CVA tenderness, left CVA tenderness or guarding.  Musculoskeletal:        General: Normal range of motion.     Cervical back: Normal range of motion and neck supple.  Skin:    General: Skin is warm and dry.  Neurological:     General: No focal deficit present.     Mental Status: He is oriented to person, place, and time.  Psychiatric:        Mood and Affect: Mood normal.        Thought Content: Thought content normal.        Judgment: Judgment normal.      UC Treatments / Results  Labs (all labs ordered are listed, but only abnormal results are displayed) Labs Reviewed  URINE CULTURE - Abnormal; Notable for the following components:      Result Value   Culture >=100,000 COLONIES/mL ESCHERICHIA COLI (*)    Organism ID, Bacteria ESCHERICHIA COLI (*)    All other components within normal limits  POCT URINALYSIS DIP (MANUAL ENTRY) - Abnormal; Notable for the following components:   Color, UA orange (*)    Bilirubin, UA small (*)    Ketones, POC UA trace (5) (*)    Blood, UA trace-intact (*)    Protein Ur, POC =30 (*)    Urobilinogen, UA >=8.0 (*)    Nitrite, UA Positive (*)    Leukocytes, UA Large (3+) (*)    All other components within normal limits  RESP PANEL BY RT-PCR (FLU A&B, COVID) [id number]    EKG   Radiology CT HEAD WO CONTRAST (5MM)  Result Date: 11/05/2021 CLINICAL DATA:  Headache. EXAM: CT HEAD WITHOUT CONTRAST TECHNIQUE: Contiguous axial images were obtained from the base of the skull through the vertex without intravenous contrast. RADIATION DOSE REDUCTION: This  exam was performed according to the departmental dose-optimization program which includes automated exposure control, adjustment of the mA and/or kV according to patient size and/or use of iterative reconstruction technique. COMPARISON:  MR head dated January 06, 2021 FINDINGS: Brain: No evidence of acute infarction, hemorrhage, hydrocephalus, extra-axial collection or mass lesion/mass effect. Vascular: No hyperdense vessel or unexpected calcification. Skull: Normal. Negative for fracture or focal lesion. Sinuses/Orbits: No acute finding. Other: None. IMPRESSION: No acute intracranial pathology. Electronically Signed   By: Keane Police D.O.   On: 11/05/2021 17:41   CT ABDOMEN PELVIS W CONTRAST  Result Date: 11/05/2021 CLINICAL DATA:  Fever, chills, vomiting and recent diagnosis of urinary tract infection. EXAM: CT ABDOMEN AND PELVIS WITH CONTRAST TECHNIQUE: Multidetector CT imaging of the abdomen and pelvis was performed using the standard protocol following bolus administration of intravenous contrast. RADIATION DOSE REDUCTION: This exam was performed according to the departmental dose-optimization program which includes automated exposure control, adjustment of the mA and/or kV according to patient size and/or use of iterative reconstruction technique. CONTRAST:  142mL OMNIPAQUE IOHEXOL 300 MG/ML  SOLN COMPARISON:  02/13/2014 FINDINGS: Lower chest: No acute abnormality. Hepatobiliary: No focal liver abnormality is seen. No gallstones, gallbladder wall thickening, or biliary dilatation. Pancreas: Unremarkable. No pancreatic ductal dilatation or surrounding inflammatory changes. Spleen: Mild splenomegaly. Adrenals/Urinary  Tract: No adrenal masses. No evidence of hydronephrosis, renal calculi, renal lesions or inflammatory process. Small cortical cysts of the right kidney have a benign appearance with the largest measuring 14 mm. These require no follow-up. No evidence to suggest focal pyelonephritis or renal  abscess. The bladder is unremarkable. Stomach/Bowel: Bowel shows no evidence of obstruction, ileus, inflammation or lesion. The appendix is normal. No free intraperitoneal air. Vascular/Lymphatic: Atherosclerosis of the abdominal aorta without aneurysm. No enlarged lymph nodes identified. Reproductive: Prostate is unremarkable. Other: Small left inguinal hernia containing fat. No abdominopelvic ascites. Musculoskeletal: No acute or significant osseous findings. IMPRESSION: 1. No acute findings in the abdomen or pelvis. 2. Mild splenomegaly. 3. Atherosclerosis of the abdominal aorta. 4. Small left inguinal hernia containing fat. Electronically Signed   By: Aletta Edouard M.D.   On: 11/05/2021 15:09    Procedures Procedures (including critical care time)  Medications Ordered in UC Medications - No data to display  Initial Impression / Assessment and Plan / UC Course  I have reviewed the triage vital signs and the nursing notes.  Pertinent labs & imaging results that were available during my care of the patient were reviewed by me and considered in my medical decision making (see chart for details).     Vital signs overall reassuring today, he appears in no acute distress and his exam is also reassuring.  His urinalysis today does show a significant urinary tract infection.  Urine culture pending.  We will treat with Keflex, Zofran in case developing nausea vomiting as he states he is feeling a bit of nausea coming on at the end of the visit and good fluid intake.  Follow-up for any worsening symptoms.  Will adjust as needed based on culture.  Final Clinical Impressions(s) / UC Diagnoses   Final diagnoses:  Acute lower UTI  Chills   Discharge Instructions   None    ED Prescriptions     Medication Sig Dispense Auth. Provider   cephALEXin (KEFLEX) 500 MG capsule Take 1 capsule (500 mg total) by mouth 2 (two) times daily. 14 capsule Volney American, PA-C   ondansetron (ZOFRAN-ODT) 4  MG disintegrating tablet Take 1 tablet (4 mg total) by mouth every 8 (eight) hours as needed for nausea or vomiting. 20 tablet Volney American, [place]      PDMP not reviewed this encounter.   Volney American, [place] 11/06/21 1623

## 2021-11-06 NOTE — Discharge Summary (Addendum)
Physician Discharge Summary  [name] WUJ:[id number] DOB: 09/04/1955 DOA: 11/05/2021  PCP: Steven SciaraLuking, Scott A, MD  Admit date: 11/05/2021 Discharge date: 11/06/2021  Admitted From:  HOME  Disposition: HOME  Recommendations for Outpatient Follow-up:  Follow up with PCP in 1 weeks Please obtain BMP/CBC in 1-2 weeks to follow up  Please follow up on the following pending results: final urine culture results  Discharge Condition: STABLE   CODE STATUS: FULL DIET: resume prior home diet    Brief Hospitalization Summary: Please see all hospital notes, images, labs for full details of the hospitalization. ADMISSION HPI:  66 y.o. male, with past medical history of Barrett's esophagus, GERD, hyperlipidemia, pacemaker placement, patient presents to ED secondary to complaints of nausea and vomiting, [name] does report flank pain, fever and dysuria, reports symptom has been going on for 1 week, worsened over last few days, for which she has been seen by urgent care, where he was diagnosed with E. coli UTI, he was discharged on Keflex, but patient reports he has been having persistent nausea and vomiting, he was only able to tolerate 1 tablet so far, tolerate any further medications due to the persistent nausea and vomiting, he reports generalized weakness, fatigue, but he denies any focal deficits, he does complains of headache started today as well. -In ED patient was afebrile, white blood cell count was 6.8, platelet count was 101, 18 at 1.26, his urine analysis was significant for pyuria, [facility] hospitalist consulted to admit  Hospital Course  This gentleman was admitted with Hicks UTI failed outpatient management due to not being able to tolerate oral cephalexin tablets with signs of nausea and emesis.  He was admitted for IV fluids, IV ceftriaxone and supportive measures.  He was having Hicks severe headache and was sent for CT brain with no acute findings.  He is feeling better today, tolerating diet, no  emesis.  DC home today.  UTI fully treated in hospital with IV ceftriaxone.  Pt advised to follow up with PCP.   Discharge Diagnoses:  Principal Problem:   UTI (urinary tract infection) Active Problems:   Pacemaker, MDT 5/12 for SB   Dehydration   Dyslipidemia   Discharge Instructions:  Allergies as of 11/06/2021       Reactions   Other Anaphylaxis   GI Cocktail Chinese food- causes upset stomach and vomiting   Norvasc [amlodipine Besylate] Swelling   Levofloxacin Itching, Rash   Other reaction(s): Not available   Sulfonamide Derivatives Swelling   REACTION: tongue swells        Medication List     STOP taking these medications    cephALEXin 500 MG capsule Commonly known as: KEFLEX   CLEAR EYES OP       TAKE these medications    acetaminophen 325 MG tablet Commonly known as: TYLENOL Take 2 tablets (650 mg total) by mouth every 6 (six) hours as needed for mild pain, fever or headache (or Fever >/= 101).   aspirin EC 81 MG tablet Take 81 mg by mouth daily.   cefdinir 300 MG capsule Commonly known as: OMNICEF Take 1 capsule (300 mg total) by mouth daily. Start taking on: November 07, 2021   fenofibrate 145 MG tablet Commonly known as: TRICOR TAKE 1 TABLET (145 MG TOTAL) BY MOUTH DAILY. KEEP OFFICE VISIT   glucosamine-chondroitin 500-400 MG tablet Take 1 tablet by mouth 3 (three) times daily.   multivitamin tablet Take 1 tablet by mouth daily.   omeprazole 20 MG capsule  Commonly known as: PRILOSEC TAKE 1 CAPSULE BY MOUTH EVERY DAY   ondansetron 4 MG disintegrating tablet Commonly known as: ZOFRAN-ODT Take 1 tablet (4 mg total) by mouth every 8 (eight) hours as needed for nausea or vomiting.   SUDAFED 12 HOUR PO Take 1 tablet by mouth daily as needed (Sinus).        Follow-up Information     Luking, Jonna Coup, MD. Schedule an appointment as soon as possible for Hicks visit in 2 week(s).   Specialty: Family Medicine Why: Hospital Follow Up Contact  information: [address] [place] [address] [phone]         Steven Fair, MD .   Specialty: Cardiology Contact information: [address] [place] [address] [phone]                Allergies  Allergen Reactions   Other Anaphylaxis    GI Cocktail Chinese food- causes upset stomach and vomiting   Norvasc [Amlodipine Besylate] Swelling   Levofloxacin Itching and Rash    Other reaction(s): Not available   Sulfonamide Derivatives Swelling    REACTION: tongue swells   Allergies as of 11/06/2021       Reactions   Other Anaphylaxis   GI Cocktail Chinese food- causes upset stomach and vomiting   Norvasc [amlodipine Besylate] Swelling   Levofloxacin Itching, Rash   Other reaction(s): Not available   Sulfonamide Derivatives Swelling   REACTION: tongue swells        Medication List     STOP taking these medications    cephALEXin 500 MG capsule Commonly known as: KEFLEX   CLEAR EYES OP       TAKE these medications    acetaminophen 325 MG tablet Commonly known as: TYLENOL Take 2 tablets (650 mg total) by mouth every 6 (six) hours as needed for mild pain, fever or headache (or Fever >/= 101).   aspirin EC 81 MG tablet Take 81 mg by mouth daily.   cefdinir 300 MG capsule Commonly known as: OMNICEF Take 1 capsule (300 mg total) by mouth daily. Start taking on: November 07, 2021   fenofibrate 145 MG tablet Commonly known as: TRICOR TAKE 1 TABLET (145 MG TOTAL) BY MOUTH DAILY. KEEP OFFICE VISIT   glucosamine-chondroitin 500-400 MG tablet Take 1 tablet by mouth 3 (three) times daily.   multivitamin tablet Take 1 tablet by mouth daily.   omeprazole 20 MG capsule Commonly known as: PRILOSEC TAKE 1 CAPSULE BY MOUTH EVERY DAY   ondansetron 4 MG disintegrating tablet Commonly known as: ZOFRAN-ODT Take 1 tablet (4 mg total) by mouth every 8 (eight) hours as needed for nausea or vomiting.   SUDAFED 12 HOUR  PO Take 1 tablet by mouth daily as needed (Sinus).        Procedures/Studies: CT HEAD WO CONTRAST ( )  Result Date: 11/05/2021 CLINICAL DATA:  Headache. EXAM: CT HEAD WITHOUT CONTRAST TECHNIQUE: Contiguous axial images were obtained from the base of the skull through the vertex without intravenous contrast. RADIATION DOSE REDUCTION: This exam was performed according to the departmental dose-optimization program which includes automated exposure control, adjustment of the mA and/or kV according to patient size and/or use of iterative reconstruction technique. COMPARISON:  MR head dated January 06, 2021 FINDINGS: Brain: No evidence of acute infarction, hemorrhage, hydrocephalus, extra-axial collection or mass lesion/mass effect. Vascular: No hyperdense vessel or unexpected calcification. Skull: Normal. Negative for fracture or focal lesion. Sinuses/Orbits: No acute finding. Other: None. IMPRESSION:  No acute intracranial pathology. Electronically Signed   By: Larose Hires D.O.   On: 11/05/2021 17:41   CT ABDOMEN PELVIS W CONTRAST  Result Date: 11/05/2021 CLINICAL DATA:  Fever, chills, vomiting and recent diagnosis of urinary tract infection. EXAM: CT ABDOMEN AND PELVIS WITH CONTRAST TECHNIQUE: Multidetector CT imaging of the abdomen and pelvis was performed using the standard protocol following bolus administration of intravenous contrast. RADIATION DOSE REDUCTION: This exam was performed according to the departmental dose-optimization program which includes automated exposure control, adjustment of the mA and/or kV according to patient size and/or use of iterative reconstruction technique. CONTRAST:  OMNIPAQUE IOHEXOL 300 MG/ML  SOLN COMPARISON:  02/13/2014 FINDINGS: Lower chest: No acute abnormality. Hepatobiliary: No focal liver abnormality is seen. No gallstones, gallbladder wall thickening, or biliary dilatation. Pancreas: Unremarkable. No pancreatic ductal dilatation or surrounding  inflammatory changes. Spleen: Mild splenomegaly. Adrenals/Urinary Tract: No adrenal masses. No evidence of hydronephrosis, renal calculi, renal lesions or inflammatory process. Small cortical cysts of the right kidney have Hicks benign appearance with the largest measuring 14 mm. These require no follow-up. No evidence to suggest focal pyelonephritis or renal abscess. The bladder is unremarkable. Stomach/Bowel: Bowel shows no evidence of obstruction, ileus, inflammation or lesion. The appendix is normal. No free intraperitoneal air. Vascular/Lymphatic: Atherosclerosis of the abdominal aorta without aneurysm. No enlarged lymph nodes identified. Reproductive: Prostate is unremarkable. Other: Small left inguinal hernia containing fat. No abdominopelvic ascites. Musculoskeletal: No acute or significant osseous findings. IMPRESSION: 1. No acute findings in the abdomen or pelvis. 2. Mild splenomegaly. 3. Atherosclerosis of the abdominal aorta. 4. Small left inguinal hernia containing fat. Electronically Signed   By: Irish Lack M.D.   On: 11/05/2021 15:09     Subjective: Some headache but otherwise feeling better after treatments  Discharge Exam: Vitals:   11/06/21 0216 11/06/21 0617  BP: 114/81 (!) 103/59  Pulse: 78 81  Resp: 16 18  Temp: 99.4 F (37.4 C) 98.1 F (36.7 C)  SpO2: 98% 96%   Vitals:   11/05/21 1825 11/05/21 2204 11/06/21 0216 11/06/21 0617  BP: 115/82 108/61 114/81 (!) 103/59  Pulse: 79 88 78 81  Resp: Temp: 98.9 F (37.2 C) 99.6 F (37.6 C) 99.4 F (37.4 C) 98.1 F (36.7 C)  TempSrc: Oral Oral Oral Oral  SpO2: 99% 97% 98% 96%  Weight:      Height:       General: Pt is alert, awake, not in acute distress Cardiovascular: RRR, S1/S2 +, no rubs, no gallops Respiratory: CTA bilaterally, no wheezing, no rhonchi Abdominal: Soft, NT, ND, bowel sounds + Extremities: no edema, no cyanosis   The results of significant diagnostics from this hospitalization (including  imaging, microbiology, ancillary and laboratory) are listed below for reference.     Microbiology: Recent Results (from the past 240 hour(s))  Urine Culture     Status: Abnormal   Collection Time: 11/03/21  4:55 PM   Specimen: Urine, Random  Result Value Ref Range Status   Specimen Description   Final    URINE, RANDOM Performed at [facility], [address], [place], [place] [address]    Special Requests   Final    NONE Performed at [facility], [address] [place], [place] [address]    Culture >=100,000 COLONIES/mL ESCHERICHIA COLI (Hicks)  Final   Report Status 11/05/2021 FINAL  Final   Organism ID, Bacteria ESCHERICHIA COLI (Hicks)  Final  Susceptibility   Escherichia coli - MIC*    AMPICILLIN >=32 RESISTANT Resistant     CEFAZOLIN 8 SENSITIVE Sensitive     CEFEPIME <=0.12 SENSITIVE Sensitive     CEFTRIAXONE <=0.25 SENSITIVE Sensitive     CIPROFLOXACIN <=0.25 SENSITIVE Sensitive     GENTAMICIN <=1 SENSITIVE Sensitive     IMIPENEM <=0.25 SENSITIVE Sensitive     NITROFURANTOIN <=16 SENSITIVE Sensitive     TRIMETH/SULFA <=20 SENSITIVE Sensitive     AMPICILLIN/SULBACTAM >=32 RESISTANT Resistant     PIP/TAZO <=4 SENSITIVE Sensitive     * >=100,000 COLONIES/mL ESCHERICHIA COLI  Resp Panel by RT-PCR (Flu Hicks&B, Covid) Anterior Nasal Swab     Status: None   Collection Time: 11/03/21  4:55 PM   Specimen: Anterior Nasal Swab  Result Value Ref Range Status   SARS Coronavirus 2 by RT PCR NEGATIVE NEGATIVE Final    Comment: (NOTE) SARS-CoV-2 target nucleic acids are NOT DETECTED.  The SARS-CoV-2 RNA is generally detectable in upper respiratory specimens during the acute phase of infection. The lowest concentration of SARS-CoV-2 viral copies this assay can detect is 138 copies/mL. Hicks negative result does not preclude SARS-Cov-2 infection and should not be used as the sole basis for treatment or other patient management decisions. Hicks negative result may occur with  improper  specimen collection/handling, submission of specimen other than nasopharyngeal swab, presence of viral mutation(s) within the areas targeted by this assay, and inadequate number of viral copies(<138 copies/mL). Hicks negative result must be combined with clinical observations, patient history, and epidemiological information. The expected result is Negative.  Fact Sheet for Patients:  BloggerCourse.com  Fact Sheet for Healthcare Providers:  SeriousBroker.it  This test is no t yet approved or cleared by the [place] FDA and  has been authorized for detection and/or diagnosis of SARS-CoV-2 by FDA under an Emergency Use Authorization (EUA). This EUA will remain  in effect (meaning this test can be used) for the duration of the COVID-19 declaration under Section 564(b)(1) of the Act, 21 U.S.C.section 360bbb-3(b)(1), unless the authorization is terminated  or revoked sooner.       Influenza Hicks by PCR NEGATIVE NEGATIVE Final   Influenza B by PCR NEGATIVE NEGATIVE Final    Comment: (NOTE) The Xpert Xpress SARS-CoV-2/FLU/RSV plus assay is intended as an aid in the diagnosis of influenza from Nasopharyngeal swab specimens and should not be used as Hicks sole basis for treatment. Nasal washings and aspirates are unacceptable for Xpert Xpress SARS-CoV-2/FLU/RSV testing.  Fact Sheet for Patients: BloggerCourse.com  Fact Sheet for Healthcare Providers: SeriousBroker.it  This test is not yet approved or cleared by the [place] FDA and has been authorized for detection and/or diagnosis of SARS-CoV-2 by FDA under an Emergency Use Authorization (EUA). This EUA will remain in effect (meaning this test can be used) for the duration of the COVID-19 declaration under Section 564(b)(1) of the Act, 21 U.S.C. section 360bbb-3(b)(1), unless the authorization is terminated or revoked.  Performed at  [facility], [address] [place], [place] [address]   Urine Culture     Status: None   Collection Time: 11/05/21  1:38 PM   Specimen: Urine, Clean Catch  Result Value Ref Range Status   Specimen Description   Final    URINE, CLEAN CATCH Performed at [facility], [address] [place], [place] [address]    Special Requests   Final    NONE Performed at [name], [address], [place],  [place] [address]    Culture   Final    NO GROWTH Performed at [facility], [place] [address], [place], [place] [address]    Report Status 11/06/2021 FINAL  Final  Resp Panel by RT-PCR (Flu Hicks&B, Covid) Anterior Nasal Swab     Status: None   Collection Time: 11/05/21  4:07 PM   Specimen: Anterior Nasal Swab  Result Value Ref Range Status   SARS Coronavirus 2 by RT PCR NEGATIVE NEGATIVE Final    Comment: (NOTE) SARS-CoV-2 target nucleic acids are NOT DETECTED.  The SARS-CoV-2 RNA is generally detectable in upper respiratory specimens during the acute phase of infection. The lowest concentration of SARS-CoV-2 viral copies this assay can detect is 138 copies/mL. Hicks negative result does not preclude SARS-Cov-2 infection and should not be used as the sole basis for treatment or other patient management decisions. Hicks negative result may occur with  improper specimen collection/handling, submission of specimen other than nasopharyngeal swab, presence of viral mutation(s) within the areas targeted by this assay, and inadequate number of viral copies(<138 copies/mL). Hicks negative result must be combined with clinical observations, patient history, and epidemiological information. The expected result is Negative.  Fact Sheet for Patients:  EntrepreneurPulse.com.au  Fact Sheet for Healthcare Providers:  IncredibleEmployment.be  This test is no t yet approved or cleared by the [place] FDA and  has been authorized for detection and/or diagnosis  of SARS-CoV-2 by FDA under an Emergency Use Authorization (EUA). This EUA will remain  in effect (meaning this test can be used) for the duration of the COVID-19 declaration under Section 564(b)(1) of the Act, 21 U.S.C.section 360bbb-3(b)(1), unless the authorization is terminated  or revoked sooner.       Influenza Hicks by PCR NEGATIVE NEGATIVE Final   Influenza B by PCR NEGATIVE NEGATIVE Final    Comment: (NOTE) The Xpert Xpress SARS-CoV-2/FLU/RSV plus assay is intended as an aid in the diagnosis of influenza from Nasopharyngeal swab specimens and should not be used as Hicks sole basis for treatment. Nasal washings and aspirates are unacceptable for Xpert Xpress SARS-CoV-2/FLU/RSV testing.  Fact Sheet for Patients: EntrepreneurPulse.com.au  Fact Sheet for Healthcare Providers: IncredibleEmployment.be  This test is not yet approved or cleared by the [place] FDA and has been authorized for detection and/or diagnosis of SARS-CoV-2 by FDA under an Emergency Use Authorization (EUA). This EUA will remain in effect (meaning this test can be used) for the duration of the COVID-19 declaration under Section 564(b)(1) of the Act, 21 U.S.C. section 360bbb-3(b)(1), unless the authorization is terminated or revoked.  Performed at [facility], [address], [place], [place] [address]   Culture, blood (Routine X 2) w Reflex to ID Panel     Status: None (Preliminary result)   Collection Time: 11/05/21  5:24 PM   Specimen: Left Antecubital; Blood  Result Value Ref Range Status   Specimen Description LEFT ANTECUBITAL  Final   Special Requests   Final    BOTTLES DRAWN AEROBIC AND ANAEROBIC Blood Culture adequate volume   Culture   Final    NO GROWTH < 12 HOURS Performed at [facility], [address], [place],  [address]    Report Status PENDING  Incomplete  Culture, blood (Routine X 2) w Reflex to ID Panel     Status: None (Preliminary result)    Collection Time: 11/05/21  5:28 PM   Specimen: BLOOD LEFT ARM  Result Value Ref Range Status   Specimen Description BLOOD  LEFT ARM  Final   Special Requests   Final    BOTTLES DRAWN AEROBIC AND ANAEROBIC Blood Culture adequate volume   Culture   Final    NO GROWTH < 12 HOURS Performed at [facility], [address], [place], [place] [address]    Report Status PENDING  Incomplete     Labs: BNP (last 3 results) No results for input(s): "BNP" in the last 8760 hours. Basic Metabolic Panel: Recent Labs  Lab 11/05/21 1250 11/06/21 0518  NA 135 135  K 3.9 3.8  CL 106 106  CO2 22 23  GLUCOSE 122* 115*  BUN 23 24*  CREATININE 1.26* 1.26*  CALCIUM 9.0 8.7*   Liver Function Tests: Recent Labs  Lab 11/05/21 1250  AST 29  ALT 41  ALKPHOS 54  BILITOT 1.3*  PROT 6.8  ALBUMIN 3.5   Recent Labs  Lab 11/05/21 1250  LIPASE 24   No results for input(s): "AMMONIA" in the last 168 hours. CBC: Recent Labs  Lab 11/05/21 1250 11/06/21 0518  WBC 6.8 2.8*  NEUTROABS 6.1  --   HGB 14.0 12.8*  HCT 40.7 37.3*  MCV 96.9 96.1  PLT 101* 106*   Cardiac Enzymes: No results for input(s): "CKTOTAL", "CKMB", "CKMBINDEX", "TROPONINI" in the last 168 hours. BNP: Invalid input(s): "POCBNP" CBG: No results for input(s): "GLUCAP" in the last 168 hours. D-Dimer No results for input(s): "DDIMER" in the last 72 hours. Hgb A1c No results for input(s): "HGBA1C" in the last 72 hours. Lipid Profile No results for input(s): "CHOL", "HDL", "LDLCALC", "TRIG", "CHOLHDL", "LDLDIRECT" in the last 72 hours. Thyroid function studies No results for input(s): "TSH", "T4TOTAL", "T3FREE", "THYROIDAB" in the last 72 hours.  Invalid input(s): "FREET3" Anemia work up No results for input(s): "VITAMINB12", "FOLATE", "FERRITIN", "TIBC", "IRON", "RETICCTPCT" in the last 72 hours. Urinalysis    Component Value Date/Time   COLORURINE AMBER (Hicks) 11/05/2021 1338   APPEARANCEUR HAZY (Hicks) 11/05/2021 1338    LABSPEC 1.028 11/05/2021 1338   PHURINE 5.0 11/05/2021 1338   GLUCOSEU NEGATIVE 11/05/2021 1338   HGBUR NEGATIVE 11/05/2021 1338   BILIRUBINUR NEGATIVE 11/05/2021 1338   BILIRUBINUR small (Hicks) 11/03/2021 1641   KETONESUR NEGATIVE 11/05/2021 1338   PROTEINUR 100 (Hicks) 11/05/2021 1338   UROBILINOGEN >=8.0 (Hicks) 11/03/2021 1641   UROBILINOGEN 1.0 02/13/2014 0424   NITRITE NEGATIVE 11/05/2021 1338   LEUKOCYTESUR NEGATIVE 11/05/2021 1338   Sepsis Labs Recent Labs  Lab 11/05/21 1250 11/06/21 0518  WBC 6.8 2.8*   Microbiology Recent Results (from the past 240 hour(s))  Urine Culture     Status: Abnormal   Collection Time: 11/03/21  4:55 PM   Specimen: Urine, Random  Result Value Ref Range Status   Specimen Description   Final    URINE, RANDOM Performed at [facility], [address], [place], [place] [address]    Special Requests   Final    NONE Performed at [facility], [address], [place], [place] [address]    Culture >=100,000 COLONIES/mL ESCHERICHIA COLI (Hicks)  Final   Report Status 11/05/2021 FINAL  Final   Organism ID, Bacteria ESCHERICHIA COLI (Hicks)  Final      Susceptibility   Escherichia coli - MIC*    AMPICILLIN >=32 RESISTANT Resistant     CEFAZOLIN 8 SENSITIVE Sensitive     CEFEPIME <=0.12 SENSITIVE Sensitive     CEFTRIAXONE <=0.25 SENSITIVE Sensitive     CIPROFLOXACIN <=0.25 SENSITIVE Sensitive     GENTAMICIN <=1  SENSITIVE Sensitive     IMIPENEM <=0.25 SENSITIVE Sensitive     NITROFURANTOIN <=16 SENSITIVE Sensitive     TRIMETH/SULFA <=20 SENSITIVE Sensitive     AMPICILLIN/SULBACTAM >=32 RESISTANT Resistant     PIP/TAZO <=4 SENSITIVE Sensitive     * >=100,000 COLONIES/mL ESCHERICHIA COLI  Resp Panel by RT-PCR (Flu Hicks&B, Covid) Anterior Nasal Swab     Status: None   Collection Time: 11/03/21  4:55 PM   Specimen: Anterior Nasal Swab  Result Value Ref Range Status   SARS Coronavirus 2 by RT PCR NEGATIVE NEGATIVE Final    Comment: (NOTE) SARS-CoV-2 target  nucleic acids are NOT DETECTED.  The SARS-CoV-2 RNA is generally detectable in upper respiratory specimens during the acute phase of infection. The lowest concentration of SARS-CoV-2 viral copies this assay can detect is 138 copies/mL. Hicks negative result does not preclude SARS-Cov-2 infection and should not be used as the sole basis for treatment or other patient management decisions. Hicks negative result may occur with  improper specimen collection/handling, submission of specimen other than nasopharyngeal swab, presence of viral mutation(s) within the areas targeted by this assay, and inadequate number of viral copies(<138 copies/mL). Hicks negative result must be combined with clinical observations, patient history, and epidemiological information. The expected result is Negative.  Fact Sheet for Patients:  BloggerCourse.com  Fact Sheet for Healthcare Providers:  SeriousBroker.it  This test is no t yet approved or cleared by the [place] FDA and  has been authorized for detection and/or diagnosis of SARS-CoV-2 by FDA under an Emergency Use Authorization (EUA). This EUA will remain  in effect (meaning this test can be used) for the duration of the COVID-19 declaration under Section 564(b)(1) of the Act, 21 U.S.C.section 360bbb-3(b)(1), unless the authorization is terminated  or revoked sooner.       Influenza Hicks by PCR NEGATIVE NEGATIVE Final   Influenza B by PCR NEGATIVE NEGATIVE Final    Comment: (NOTE) The Xpert Xpress SARS-CoV-2/FLU/RSV plus assay is intended as an aid in the diagnosis of influenza from Nasopharyngeal swab specimens and should not be used as Hicks sole basis for treatment. Nasal washings and aspirates are unacceptable for Xpert Xpress SARS-CoV-2/FLU/RSV testing.  Fact Sheet for Patients: BloggerCourse.com  Fact Sheet for Healthcare  Providers: SeriousBroker.it  This test is not yet approved or cleared by the [place] FDA and has been authorized for detection and/or diagnosis of SARS-CoV-2 by FDA under an Emergency Use Authorization (EUA). This EUA will remain in effect (meaning this test can be used) for the duration of the COVID-19 declaration under Section 564(b)(1) of the Act, 21 U.S.C. section 360bbb-3(b)(1), unless the authorization is terminated or revoked.  Performed at [facility], [address], [place], [place] [address]   Urine Culture     Status: None   Collection Time: 11/05/21  1:38 PM   Specimen: Urine, Clean Catch  Result Value Ref Range Status   Specimen Description   Final    URINE, CLEAN CATCH Performed at [facility], [address], [place], [place] [address]    Special Requests   Final    NONE Performed at [facility], [address] [place], [place] [address]    Culture   Final    NO GROWTH Performed at [facility], [address] [address], [place], [place] [address]    Report Status 11/06/2021 FINAL  Final  Resp Panel by RT-PCR (Flu Hicks&B, Covid) Anterior Nasal Swab     Status:  None   Collection Time: 11/05/21  4:07 PM   Specimen: Anterior Nasal Swab  Result Value Ref Range Status   SARS Coronavirus 2 by RT PCR NEGATIVE NEGATIVE Final    Comment: (NOTE) SARS-CoV-2 target nucleic acids are NOT DETECTED.  The SARS-CoV-2 RNA is generally detectable in upper respiratory specimens during the acute phase of infection. The lowest concentration of SARS-CoV-2 viral copies this assay can detect is 138 copies/mL. Hicks negative result does not preclude SARS-Cov-2 infection and should not be used as the sole basis for treatment or other patient management decisions. Hicks negative result may occur with  improper specimen collection/handling, submission of specimen other than nasopharyngeal swab, presence of viral mutation(s) within the areas targeted by this  assay, and inadequate number of viral copies(<138 copies/mL). Hicks negative result must be combined with clinical observations, patient history, and epidemiological information. The expected result is Negative.  Fact Sheet for Patients:  BloggerCourse.com  Fact Sheet for Healthcare Providers:  SeriousBroker.it  This test is no t yet approved or cleared by the [place] FDA and  has been authorized for detection and/or diagnosis of SARS-CoV-2 by FDA under an Emergency Use Authorization (EUA). This EUA will remain  in effect (meaning this test can be used) for the duration of the COVID-19 declaration under Section 564(b)(1) of the Act, 21 U.S.C.section 360bbb-3(b)(1), unless the authorization is terminated  or revoked sooner.       Influenza Hicks by PCR NEGATIVE NEGATIVE Final   Influenza B by PCR NEGATIVE NEGATIVE Final    Comment: (NOTE) The Xpert Xpress SARS-CoV-2/FLU/RSV plus assay is intended as an aid in the diagnosis of influenza from Nasopharyngeal swab specimens and should not be used as Hicks sole basis for treatment. Nasal washings and aspirates are unacceptable for Xpert Xpress SARS-CoV-2/FLU/RSV testing.  Fact Sheet for Patients: BloggerCourse.com  Fact Sheet for Healthcare Providers: SeriousBroker.it  This test is not yet approved or cleared by the [place] FDA and has been authorized for detection and/or diagnosis of SARS-CoV-2 by FDA under an Emergency Use Authorization (EUA). This EUA will remain in effect (meaning this test can be used) for the duration of the COVID-19 declaration under Section 564(b)(1) of the Act, 21 U.S.C. section 360bbb-3(b)(1), unless the authorization is terminated or revoked.  Performed at [facility], [address], [place], [place] [address]   Culture, blood (Routine X 2) w Reflex to ID Panel     Status: None (Preliminary result)    Collection Time: 11/05/21  5:24 PM   Specimen: Left Antecubital; Blood  Result Value Ref Range Status   Specimen Description LEFT ANTECUBITAL  Final   Special Requests   Final    BOTTLES DRAWN AEROBIC AND ANAEROBIC Blood Culture adequate volume   Culture   Final    NO GROWTH < 12 HOURS Performed at [facility], [address] [place], [place] [address]    Report Status PENDING  Incomplete  Culture, blood (Routine X 2) w Reflex to ID Panel     Status: None (Preliminary result)   Collection Time: 11/05/21  5:28 PM   Specimen: BLOOD LEFT ARM  Result Value Ref Range Status   Specimen Description BLOOD LEFT ARM  Final   Special Requests   Final    BOTTLES DRAWN AEROBIC AND ANAEROBIC Blood Culture adequate volume   Culture   Final    NO GROWTH < 12 HOURS Performed at [facility], [address], [place], [place] [address]    Report  Status PENDING  Incomplete   Time coordinating discharge:   SIGNED:  Standley Dakins, MD  [facility] 11/06/2021, 2:31 PM How to contact the [facility] Attending or Consulting provider 7A - 7P or covering provider during after hours 7P -7A, for this patient?  Check the care team in [facility] and look for Hicks) attending/consulting TR[facility] provider listed and b) the [facility] team listed Log into [email or web address] and use [place]'s universal password to access. If you do not have the password, please contact the hospital operator. Locate the [facility] provider you are looking for under [facility] and page to Hicks number that you can be directly reached. If you still have difficulty reaching the provider, please page the [facility] (Director on Call) for the Hospitalists listed on amion for assistance.

## 2021-11-08 ENCOUNTER — Encounter: Payer: Self-pay | Admitting: Physician Assistant

## 2021-11-08 ENCOUNTER — Ambulatory Visit: Payer: BC Managed Care – PPO | Admitting: Physician Assistant

## 2021-11-08 VITALS — BP 140/87 | HR 69

## 2021-11-08 DIAGNOSIS — N3001 Acute cystitis with hematuria: Secondary | ICD-10-CM

## 2021-11-08 DIAGNOSIS — N529 Male erectile dysfunction, unspecified: Secondary | ICD-10-CM | POA: Diagnosis not present

## 2021-11-08 DIAGNOSIS — R8281 Pyuria: Secondary | ICD-10-CM

## 2021-11-08 DIAGNOSIS — R339 Retention of urine, unspecified: Secondary | ICD-10-CM | POA: Diagnosis not present

## 2021-11-08 DIAGNOSIS — R310 Gross hematuria: Secondary | ICD-10-CM

## 2021-11-08 LAB — BLADDER SCAN AMB NON-IMAGING: Scan Result: 185

## 2021-11-08 MED ORDER — ALFUZOSIN HCL ER 10 MG PO TB24: 10.0000 mg | ORAL_TABLET | Freq: Every day | ORAL | 3 refills | Status: DC

## 2021-11-08 MED ORDER — DOXYCYCLINE HYCLATE 100 MG PO CAPS: 100.0000 mg | ORAL_CAPSULE | Freq: Two times a day (BID) | ORAL | 0 refills | Status: DC

## 2021-11-08 NOTE — Progress Notes (Unsigned)
1:45 PM post void residual =185

## 2021-11-08 NOTE — Progress Notes (Unsigned)
11/08/2021 1:46 PM   [name] January 08, 1956 [id number]   Assessment:  1. Hematuria, unspecified type - Urinalysis, Routine w reflex microscopic - BLADDER SCAN AMB NON-IMAGING    Plan: ***  No orders of the defined types were placed in this encounter.     Chief Complaint: ***  Referring provider: Babs Sciara, MD [address] [place],  [place] [address]   History of Present Illness:  [name] is a 66 y.o. year old male who is seen in consultation from Babs Sciara, MD  for evaluation of recent UTI with admission on 11/05/2021 for the same.  Patient was seen in the emergency department with lower urinary tract symptoms fever and nausea.  He was treated with p.o. Keflex and urine culture grew E. coli. resistant to ampicillin.  Patient symptoms did not improve and he presented back to the emergency department on 11/05/2021 and was admitted for treatment with IV Rocephin and he was discharged 2 days ago on p.o. Keflex.  Urine on 9/29 showed microscopic hematuria and culture -no growth.  To date, blood cultures have resulted no growth.   Soda intake= UA= PVR=162ml  Portions of the above documentation were copied from a prior visit for review purposes only. Medical records including notes, lab results, and imaging studies reviewed during pt OV.  Past Medical History:  Past Medical History:  Diagnosis Date   Allergy    seasonal allergies   Arthritis    LEFT shoulder, RIGHT elbow   Barrett's esophagus    GERD (gastroesophageal reflux disease)    on meds   Hypercholesterolemia    triglycerides elevated-on meds   Hypotension    Pacemaker    Syncope     Past Surgical History:  Past Surgical History:  Procedure Laterality Date   birthmark removal     right thigh   BLADDER SURGERY     stretched   CARDIAC CATHETERIZATION  09/15/2006   Recommendation - empiric anti-reflux therapy   CARDIOVASCULAR STRESS TEST  04/08/2011   No scintigraphic evidence  of inducible myocardial ischemia. No lexiscan EKG changes. Non-diagnostic for ischemia.   CERVICAL SPINE SURGERY     plates and screws   COLONOSCOPY  2011   DJ-F/V-moviprep(exc)TICS/HPP   LOWER EXTREMITY ARTERIAL DOPPLER  10/02/2006   No evidence of thrombus or thrombphlebitis.   PACEMAKER INSERTION  06/08/2010   Medtronic Revo model #RVDR01, serial [id number]   POLYPECTOMY  2011   HPP   PPM GENERATOR CHANGEOUT N/A 02/15/2021   Procedure: PPM GENERATOR CHANGEOUT;  Surgeon: Thurmon Fair, MD;  Location: [facility];  Service: Cardiovascular;  Laterality: N/A;   TONSILLECTOMY     TRANSTHORACIC ECHOCARDIOGRAM  04/06/2011   EF 55-60%, normal   UPPER GASTROINTESTINAL ENDOSCOPY     WISDOM TOOTH EXTRACTION      Allergies:  Allergies  Allergen Reactions   Other Anaphylaxis    GI Cocktail Chinese food- causes upset stomach and vomiting   Norvasc [Amlodipine Besylate] Swelling   Levofloxacin Itching and Rash    Other reaction(s): Not available   Sulfonamide Derivatives Swelling    REACTION: tongue swells    Family History:  Family History  Problem Relation Age of Onset   Ovarian cancer Mother    Heart failure Father    Heart attack Father    Heart attack Sister    Lymphoma Brother        x 2   COPD Sister    Hypertension Brother  Hyperlipidemia Brother    Hyperlipidemia Sister    Throat cancer Maternal Uncle 51   Colon cancer Neg Hx    Stomach cancer Neg Hx    Colon polyps Neg Hx    Rectal cancer Neg Hx    Esophageal cancer Neg Hx     Social History:  Social History   Tobacco Use   Smoking status: Former    Types: Cigarettes    Quit date: 11/27/1973    Years since quitting: 47.9   Smokeless tobacco: Never  Vaping Use   Vaping Use: Never used  Substance Use Topics   Alcohol use: No   Drug use: No    Review of symptoms:  Constitutional:  Negative for unexplained weight loss, night sweats, fever, chills ENT:  Negative for nose bleeds, sinus pain,  painful swallowing CV:  Negative for chest pain, shortness of breath, exercise intolerance, palpitations, loss of consciousness Resp:  Negative for cough, wheezing, shortness of breath GI:  Negative for nausea, vomiting, diarrhea, bloody stools GU:  Positives noted in HPI; otherwise negative for gross hematuria, dysuria***, urinary incontinence Neuro:  Negative for seizures, poor balance, limb weakness, slurred speech Psych:  Negative for lack of energy, depression, anxiety Endocrine:  Negative for polydipsia, polyuria, symptoms of hypoglycemia (dizziness, hunger, sweating) Hematologic:  Negative for anemia, purpura, petechia, prolonged or excessive bleeding, use of anticoagulants***   Physical Exam: BP (!) 140/87   Pulse 69   Constitutional:  Alert and oriented, No acute distress. HEENT: [place]AT, moist mucus membranes.  Trachea midline, no masses. Cardiovascular: Regular rate and rhythm without murmur, rub, or gallops No clubbing, cyanosis, or edema***. Respiratory: Normal respiratory effort, clear to auscultation bilaterally GI: Abdomen is soft, nontender, nondistended, no abdominal masses GU: *** BACK:  Non-tender to palpation.  No CVAT Lymph: No cervical or inguinal lymphadenopathy. Skin: No obvious rashes, warm, dry, intact Neurologic: Alert and oriented, Cranial nerves grossly intact, no focal deficits, moving all 4 extremities***. Psychiatric: Appropriate. Normal mood and affect.  Laboratory Data: No results found for this or any previous visit (from the past 24 hour(s)).  Lab Results  Component Value Date   WBC 2.8 (L) 11/06/2021   HGB 12.8 (L) 11/06/2021   HCT 37.3 (L) 11/06/2021   MCV 96.1 11/06/2021   PLT 106 (L) 11/06/2021    Lab Results  Component Value Date   CREATININE 1.26 (H) 11/06/2021    Lab Results  Component Value Date   PSA 0.7 12/26/2016    Lab Results  Component Value Date   TESTOSTERONE 637 01/21/2013    Lab Results  Component Value Date    HGBA1C  06/07/2010    5.1 (NOTE)                                                                       According to the ADA Clinical Practice Recommendations for 2011, when HbA1c is used as a screening test:   >=6.5%   Diagnostic of Diabetes Mellitus           (if abnormal result  is confirmed)  5.7-6.4%   Increased risk of developing Diabetes Mellitus  References:Diagnosis and Classification of Diabetes Mellitus,Diabetes HRCB,6384,53(MIWOE 1):S62-S69 and Standards of Medical Care in  Diabetes - 2011,Diabetes Care,2011,34  (Suppl 1):S11-S61.    Urinalysis    Component Value Date/Time   COLORURINE AMBER (A) 11/05/2021 1338   APPEARANCEUR HAZY (A) 11/05/2021 1338   LABSPEC 1.028 11/05/2021 1338   PHURINE 5.0 11/05/2021 1338   GLUCOSEU NEGATIVE 11/05/2021 1338   HGBUR NEGATIVE 11/05/2021 1338   BILIRUBINUR NEGATIVE 11/05/2021 1338   BILIRUBINUR small (A) 11/03/2021 1641   KETONESUR NEGATIVE 11/05/2021 1338   PROTEINUR 100 (A) 11/05/2021 1338   UROBILINOGEN >=8.0 (A) 11/03/2021 1641   UROBILINOGEN 1.0 02/13/2014 0424   NITRITE NEGATIVE 11/05/2021 1338   LEUKOCYTESUR NEGATIVE 11/05/2021 1338    Lab Results  Component Value Date   BACTERIA NONE SEEN 11/05/2021    Pertinent Imaging: No results found for this or any previous visit.  No results found for this or any previous visit.     [place], Regan Rakers, PA-C [facility] [place]

## 2021-11-09 ENCOUNTER — Telehealth: Payer: Self-pay

## 2021-11-09 LAB — URINALYSIS, ROUTINE W REFLEX MICROSCOPIC
Bilirubin, UA: NEGATIVE
Glucose, UA: NEGATIVE
Ketones, UA: NEGATIVE
Nitrite, UA: NEGATIVE
Specific Gravity, UA: 1.015 (ref 1.005–1.030)
Urobilinogen, Ur: 4 mg/dL — ABNORMAL HIGH (ref 0.2–1.0)
pH, UA: 6 (ref 5.0–7.5)

## 2021-11-09 LAB — MICROSCOPIC EXAMINATION
Bacteria, UA: NONE SEEN
RBC, Urine: >30 /hpf — AB (ref 0–2)

## 2021-11-09 NOTE — Telephone Encounter (Signed)
Patient will need a work note to be out of work returning on Fri., Oct. 13. This is pt's next scheduled day of work.  Feels he will need this time before returning to job duties.  Please call pt back when ready for pick up.  Thanks, Helene Kelp

## 2021-11-10 LAB — CULTURE, BLOOD (ROUTINE X 2)
Culture: NO GROWTH
Culture: NO GROWTH
Special Requests: ADEQUATE
Special Requests: ADEQUATE

## 2021-11-10 LAB — URINE CULTURE: Organism ID, Bacteria: NO GROWTH

## 2021-11-10 NOTE — Telephone Encounter (Signed)
Work note created.  Patient aware note was left at front desk for him to pick up.

## 2021-11-10 NOTE — Telephone Encounter (Signed)
Please see patient's message below and advise.

## 2021-11-11 ENCOUNTER — Telehealth: Payer: Self-pay

## 2021-11-11 NOTE — Telephone Encounter (Signed)
-----   Message from Reynaldo Minium, [place] sent at 11/10/2021  1:22 PM EDT ----- Please let pt know his urine cx is negative. He needs to continue antibx tho. Prostate infections don't always show up in urine or on a culture. ----- Message ----- From: Interface, [facility] Lab Results In Sent: 11/09/2021   5:37 AM EDT To: Reynaldo Minium, PA-C

## 2021-11-11 NOTE — Telephone Encounter (Signed)
Made patient aware that his urine cx was negative and he need to continued his antibx because prostate infection don't always show up in urine or on culture. Patient voiced understanding

## 2021-11-18 ENCOUNTER — Other Ambulatory Visit: Payer: Self-pay | Admitting: Cardiovascular Disease

## 2021-11-24 ENCOUNTER — Telehealth: Payer: Self-pay

## 2021-11-24 NOTE — Telephone Encounter (Signed)
Patient provider paper work to office yesterday for [place], [place] to complete.  Will review paperwork with [name] to complete and submit by end of the week.

## 2021-11-24 NOTE — Telephone Encounter (Signed)
Patient's wife call back with fax number to send FMLA paper work. [phone] and put attention PAM.

## 2021-11-24 NOTE — Telephone Encounter (Signed)
Paperwork completed and faxed back to [phone]

## 2021-11-24 NOTE — Telephone Encounter (Signed)
Patient's wife states his employer is still waiting on records be faxed in order for him to get paid.  She will call back with the fax number and leave on voicemail.  Can you please follow up with an update if you have one.

## 2021-11-25 ENCOUNTER — Ambulatory Visit (INDEPENDENT_AMBULATORY_CARE_PROVIDER_SITE_OTHER): Payer: BC Managed Care – PPO

## 2021-11-25 DIAGNOSIS — R001 Bradycardia, unspecified: Secondary | ICD-10-CM | POA: Diagnosis not present

## 2021-11-25 LAB — CUP PACEART REMOTE DEVICE CHECK
Battery Remaining Longevity: 159 mo
Battery Voltage: 3.11 V
Brady Statistic AP VP Percent: 0.03 %
Brady Statistic AP VS Percent: 58.85 %
Brady Statistic AS VP Percent: 0.01 %
Brady Statistic AS VS Percent: 41.11 %
Brady Statistic RA Percent Paced: 58.97 %
Brady Statistic RV Percent Paced: 0.04 %
Date Time Interrogation Session: 20231019092548
Implantable Lead Implant Date: 20120501
Implantable Lead Implant Date: 20120501
Implantable Lead Location: 753859
Implantable Lead Location: 753860
Implantable Pulse Generator Implant Date: 20230109
Lead Channel Impedance Value: 323 Ohm
Lead Channel Impedance Value: 361 Ohm
Lead Channel Impedance Value: 418 Ohm
Lead Channel Impedance Value: 456 Ohm
Lead Channel Pacing Threshold Amplitude: 0.75 V
Lead Channel Pacing Threshold Amplitude: 0.875 V
Lead Channel Pacing Threshold Pulse Width: 0.4 ms
Lead Channel Pacing Threshold Pulse Width: 0.4 ms
Lead Channel Sensing Intrinsic Amplitude: 1.875 mV
Lead Channel Sensing Intrinsic Amplitude: 1.875 mV
Lead Channel Sensing Intrinsic Amplitude: 13.5 mV
Lead Channel Sensing Intrinsic Amplitude: 13.5 mV
Lead Channel Setting Pacing Amplitude: 1.5 V
Lead Channel Setting Pacing Amplitude: 2 V
Lead Channel Setting Pacing Pulse Width: 0.4 ms
Lead Channel Setting Sensing Sensitivity: 0.9 mV

## 2021-12-04 ENCOUNTER — Other Ambulatory Visit: Payer: Self-pay | Admitting: Physician Assistant

## 2021-12-04 DIAGNOSIS — R339 Retention of urine, unspecified: Secondary | ICD-10-CM

## 2021-12-07 NOTE — Progress Notes (Signed)
Remote pacemaker transmission.   

## 2021-12-15 ENCOUNTER — Ambulatory Visit (INDEPENDENT_AMBULATORY_CARE_PROVIDER_SITE_OTHER): Payer: BC Managed Care – PPO | Admitting: Urology

## 2021-12-15 VITALS — BP 132/86 | HR 73

## 2021-12-15 DIAGNOSIS — R39198 Other difficulties with micturition: Secondary | ICD-10-CM

## 2021-12-15 DIAGNOSIS — N3001 Acute cystitis with hematuria: Secondary | ICD-10-CM

## 2021-12-15 MED ORDER — CEFPODOXIME PROXETIL 200 MG PO TABS: 200.0000 mg | ORAL_TABLET | Freq: Two times a day (BID) | ORAL | 0 refills | Status: DC

## 2021-12-15 MED ORDER — SILODOSIN 8 MG PO CAPS: 8.0000 mg | ORAL_CAPSULE | Freq: Every day | ORAL | 11 refills | Status: DC

## 2021-12-15 MED ORDER — CEPHALEXIN 250 MG PO CAPS
500.0000 mg | ORAL_CAPSULE | Freq: Once | ORAL | Status: AC
  Administered 2021-12-15: 500 mg via ORAL

## 2021-12-15 NOTE — Progress Notes (Signed)
   12/15/21  CC: difficulty urinating  HPI: [name] is a 66yo here for cystoscopy for difficulty urinating Blood pressure 132/86, pulse 73. NED. A&Ox3.   No respiratory distress   Abd soft, NT, ND Normal phallus with bilateral descended testicles  Cystoscopy Procedure Note  Patient identification was confirmed, informed consent was obtained, and patient was prepped using Betadine solution.  Lidocaine jelly was administered per urethral meatus.     Pre-Procedure: - Inspection reveals a normal caliber ureteral meatus.  Procedure: The flexible cystoscope was introduced without difficulty - No urethral strictures/lesions are present. - Enlarged prostate small median lobe - Normal bladder neck - Bilateral ureteral orifices identified - Bladder mucosa  reveals no ulcers, tumors, or lesions - No bladder stones - No trabeculation  Retroflexion shows no intravesical prostatic protrusion   Post-Procedure: - Patient tolerated the procedure well  Assessment/ Plan: Start rapaflo 8mg  qhs Vantin 200mg  BID for 2 weeks  No follow-ups on file.  , MD

## 2021-12-16 LAB — URINALYSIS, ROUTINE W REFLEX MICROSCOPIC
Bilirubin, UA: NEGATIVE
Glucose, UA: NEGATIVE
Ketones, UA: NEGATIVE
Leukocytes,UA: NEGATIVE
Nitrite, UA: NEGATIVE
Protein,UA: NEGATIVE
RBC, UA: NEGATIVE
Specific Gravity, UA: 1.02 (ref 1.005–1.030)
Urobilinogen, Ur: 1 mg/dL (ref 0.2–1.0)
pH, UA: 5 (ref 5.0–7.5)

## 2021-12-21 ENCOUNTER — Encounter: Payer: Self-pay | Admitting: Urology

## 2021-12-21 NOTE — Patient Instructions (Signed)

## 2021-12-27 ENCOUNTER — Telehealth: Payer: Self-pay | Admitting: Cardiovascular Disease

## 2021-12-27 DIAGNOSIS — E785 Hyperlipidemia, unspecified: Secondary | ICD-10-CM

## 2021-12-27 NOTE — Telephone Encounter (Signed)
All he needs is a lipid panel and LP(a), everything else was checked in September. Thank you

## 2021-12-27 NOTE — Telephone Encounter (Signed)
   Pt said, he normally get labs 1 week before his appt with Dr. Salena Saner. There's no order on file. He requested if the order can be send to [facility] in [address] [place] [address] so its closer to him

## 2021-12-27 NOTE — Telephone Encounter (Signed)
Patient aware and verbalized understanding.  Lab orders mailed to patient

## 2022-01-17 ENCOUNTER — Encounter: Payer: Self-pay | Admitting: Urology

## 2022-01-17 ENCOUNTER — Ambulatory Visit (INDEPENDENT_AMBULATORY_CARE_PROVIDER_SITE_OTHER): Payer: BC Managed Care – PPO | Admitting: Urology

## 2022-01-17 VITALS — BP 149/93 | HR 80

## 2022-01-17 DIAGNOSIS — R339 Retention of urine, unspecified: Secondary | ICD-10-CM

## 2022-01-17 DIAGNOSIS — N3001 Acute cystitis with hematuria: Secondary | ICD-10-CM | POA: Diagnosis not present

## 2022-01-17 DIAGNOSIS — N529 Male erectile dysfunction, unspecified: Secondary | ICD-10-CM

## 2022-01-17 LAB — BLADDER SCAN AMB NON-IMAGING: Scan Result: 37

## 2022-01-17 MED ORDER — SILODOSIN 8 MG PO CAPS: 8.0000 mg | ORAL_CAPSULE | Freq: Every day | ORAL | 11 refills | Status: DC

## 2022-01-17 NOTE — Progress Notes (Signed)
01/17/2022 3:18 PM   [name] 1955/07/21 [id number]  Referring provider: Kathyrn Drown, MD [address] [place],  [place] [address]  Followup BPH and incomplete emptying   HPI: [name] is a 66yo here for followup for BPh with incomplete emptying. He is currently on rapaflo 8mg  daily., PVR 37cc. IPSS 11 QOL 3. Urine stream strong. Nocturia 1-2x. No straining to urinate. No dysuria. Urinary frequency every 3-4 hours. He has issues getting an erection. He can maintain the erection. He is not bothered enough to try a PDE5.    PMH: Past Medical History:  Diagnosis Date   Allergy    seasonal allergies   Arthritis    LEFT shoulder, RIGHT elbow   Barrett's esophagus    GERD (gastroesophageal reflux disease)    on meds   Hypercholesterolemia    triglycerides elevated-on meds   Hypotension    Pacemaker    Syncope     Surgical History: Past Surgical History:  Procedure Laterality Date   birthmark removal     right thigh   BLADDER SURGERY     stretched   CARDIAC CATHETERIZATION  09/15/2006   Recommendation - empiric anti-reflux therapy   CARDIOVASCULAR STRESS TEST  04/08/2011   No scintigraphic evidence of inducible myocardial ischemia. No lexiscan EKG changes. Non-diagnostic for ischemia.   CERVICAL SPINE SURGERY     plates and screws   COLONOSCOPY  2011   DJ-F/V-moviprep(exc)TICS/HPP   LOWER EXTREMITY ARTERIAL DOPPLER  10/02/2006   No evidence of thrombus or thrombphlebitis.   PACEMAKER INSERTION  06/08/2010   Medtronic Revo model #RVDR01, serial [id number]   POLYPECTOMY  2011   HPP   PPM GENERATOR CHANGEOUT N/A 02/15/2021   Procedure: PPM GENERATOR CHANGEOUT;  Surgeon: Steven Klein, MD;  Location: [facility];  Service: Cardiovascular;  Laterality: N/A;   TONSILLECTOMY     TRANSTHORACIC ECHOCARDIOGRAM  04/06/2011   EF 55-60%, normal   UPPER GASTROINTESTINAL ENDOSCOPY     WISDOM TOOTH EXTRACTION      Home Medications:  Allergies as of  01/17/2022       Reactions   Lidocaine Shortness Of Breath   Possibly allergic. Anaphylaxis   Other Anaphylaxis   GI Cocktail Chinese food- causes upset stomach and vomiting   Norvasc [amlodipine Besylate] Swelling   Levofloxacin Itching, Rash   Other reaction(s): Not available   Sulfonamide Derivatives Swelling   REACTION: tongue swells        Medication List        Accurate as of January 17, 2022  3:18 PM. If you have any questions, ask your nurse or doctor.          acetaminophen 325 MG tablet Commonly known as: TYLENOL Take 2 tablets (650 mg total) by mouth every 6 (six) hours as needed for mild pain, fever or headache (or Fever >/= 101).   alfuzosin 10 MG 24 hr tablet Commonly known as: UROXATRAL TAKE 1 TABLET (10 MG TOTAL) BY MOUTH DAILY WITH BREAKFAST.   aspirin EC 81 MG tablet Take 81 mg by mouth daily.   cefdinir 300 MG capsule Commonly known as: OMNICEF Take 1 capsule (300 mg total) by mouth daily.   cefpodoxime 200 MG tablet Commonly known as: VANTIN Take 1 tablet (200 mg total) by mouth 2 (two) times daily.   doxycycline 100 MG capsule Commonly known as: VIBRAMYCIN Take 1 capsule (100 mg total) by mouth every 12 (twelve) hours.   fenofibrate 145 MG tablet  Commonly known as: TRICOR TAKE 1 TABLET (145 MG TOTAL) BY MOUTH DAILY.   glucosamine-chondroitin 500-400 MG tablet Take 1 tablet by mouth 3 (three) times daily.   multivitamin tablet Take 1 tablet by mouth daily.   omeprazole 20 MG capsule Commonly known as: PRILOSEC TAKE 1 CAPSULE BY MOUTH EVERY DAY   ondansetron 4 MG disintegrating tablet Commonly known as: ZOFRAN-ODT Take 1 tablet (4 mg total) by mouth every 8 (eight) hours as needed for nausea or vomiting.   silodosin 8 MG Caps capsule Commonly known as: RAPAFLO Take 1 capsule (8 mg total) by mouth at bedtime.   SUDAFED 12 HOUR PO Take 1 tablet by mouth daily as needed (Sinus).        Allergies:  Allergies  Allergen  Reactions   Lidocaine Shortness Of Breath    Possibly allergic. Anaphylaxis   Other Anaphylaxis    GI Cocktail Chinese food- causes upset stomach and vomiting   Norvasc [Amlodipine Besylate] Swelling   Levofloxacin Itching and Rash    Other reaction(s): Not available   Sulfonamide Derivatives Swelling    REACTION: tongue swells    Family History: Family History  Problem Relation Age of Onset   Ovarian cancer Mother    Heart failure Father    Heart attack Father    Heart attack Sister    Lymphoma Brother        x 2   COPD Sister    Hypertension Brother    Hyperlipidemia Brother    Hyperlipidemia Sister    Throat cancer Maternal Uncle 68   Colon cancer Neg Hx    Stomach cancer Neg Hx    Colon polyps Neg Hx    Rectal cancer Neg Hx    Esophageal cancer Neg Hx     Social History:  reports that he quit smoking about 48 years ago. His smoking use included cigarettes. He has never used smokeless tobacco. He reports that he does not drink alcohol and does not use drugs.  ROS: All other review of systems were reviewed and are negative except what is noted above in HPI  Physical Exam: BP (!) 149/93   Pulse 80   Constitutional:  Alert and oriented, No acute distress. HEENT: [place] AT, moist mucus membranes.  Trachea midline, no masses. Cardiovascular: No clubbing, cyanosis, or edema. Respiratory: Normal respiratory effort, no increased work of breathing. GI: Abdomen is soft, nontender, nondistended, no abdominal masses GU: No CVA tenderness.  Lymph: No cervical or inguinal lymphadenopathy. Skin: No rashes, bruises or suspicious lesions. Neurologic: Grossly intact, no focal deficits, moving all 4 extremities. Psychiatric: Normal mood and affect.  Laboratory Data: Lab Results  Component Value Date   WBC 2.8 (L) 11/06/2021   HGB 12.8 (L) 11/06/2021   HCT 37.3 (L) 11/06/2021   MCV 96.1 11/06/2021   PLT 106 (L) 11/06/2021    Lab Results  Component Value Date   CREATININE  1.26 (H) 11/06/2021    Lab Results  Component Value Date   PSA 0.7 12/26/2016    Lab Results  Component Value Date   TESTOSTERONE 637 01/21/2013    Lab Results  Component Value Date   HGBA1C  06/07/2010    5.1 (NOTE)  According to the ADA Clinical Practice Recommendations for 2011, when HbA1c is used as a screening test:   >=6.5%   Diagnostic of Diabetes Mellitus           (if abnormal result  is confirmed)  5.7-6.4%   Increased risk of developing Diabetes Mellitus  References:Diagnosis and Classification of Diabetes Mellitus,Diabetes Care,2011,34(Suppl 1):S62-S69 and Standards of Medical Care in         Diabetes - 2011,Diabetes Care,2011,34  (Suppl 1):S11-S61.    Urinalysis    Component Value Date/Time   COLORURINE AMBER (A) 11/05/2021 1338   APPEARANCEUR Clear 12/15/2021 1600   LABSPEC 1.028 11/05/2021 1338   PHURINE 5.0 11/05/2021 1338   GLUCOSEU Negative 12/15/2021 1600   HGBUR NEGATIVE 11/05/2021 1338   BILIRUBINUR Negative 12/15/2021 1600   KETONESUR NEGATIVE 11/05/2021 1338   PROTEINUR Negative 12/15/2021 1600   PROTEINUR 100 (A) 11/05/2021 1338   UROBILINOGEN >=8.0 (A) 11/03/2021 1641   UROBILINOGEN 1.0 02/13/2014 0424   NITRITE Negative 12/15/2021 1600   NITRITE NEGATIVE 11/05/2021 1338   LEUKOCYTESUR Negative 12/15/2021 1600   LEUKOCYTESUR NEGATIVE 11/05/2021 1338    Lab Results  Component Value Date   LABMICR Comment 12/15/2021   WBCUA 11-30 (A) 11/08/2021   LABEPIT 0-10 11/08/2021   MUCUS Present (A) 11/08/2021   BACTERIA None seen 11/08/2021    Pertinent Imaging:  No results found for this or any previous visit.  No results found for this or any previous visit.  No results found for this or any previous visit.  No results found for this or any previous visit.  No results found for this or any previous visit.  No valid procedures specified. No results found for this or any  previous visit.  No results found for this or any previous visit.   Assessment & Plan:    1. Acute cystitis with hematuria -urine for culture - Urinalysis, Routine w reflex microscopic  2. Incomplete bladder emptying -rapaflo 8mg  daily - BLADDER SCAN AMB NON-IMAGING  3. Erectile dysfunction, unspecified erectile dysfunction type -patient defers therapy at this time   No follow-ups on file.  , MD  [facility] [place]

## 2022-01-17 NOTE — Patient Instructions (Signed)

## 2022-01-17 NOTE — Progress Notes (Signed)
post void residual=37 

## 2022-01-18 LAB — URINALYSIS, ROUTINE W REFLEX MICROSCOPIC
Bilirubin, UA: NEGATIVE
Glucose, UA: NEGATIVE
Ketones, UA: NEGATIVE
Leukocytes,UA: NEGATIVE
Nitrite, UA: NEGATIVE
Protein,UA: NEGATIVE
RBC, UA: NEGATIVE
Specific Gravity, UA: 1.025 (ref 1.005–1.030)
Urobilinogen, Ur: 1 mg/dL (ref 0.2–1.0)
pH, UA: 5.5 (ref 5.0–7.5)

## 2022-02-24 ENCOUNTER — Ambulatory Visit: Payer: BC Managed Care – PPO | Attending: Cardiovascular Disease

## 2022-02-24 DIAGNOSIS — R55 Syncope and collapse: Secondary | ICD-10-CM

## 2022-03-01 LAB — CUP PACEART REMOTE DEVICE CHECK
Battery Remaining Longevity: 157 mo
Battery Voltage: 3.07 V
Brady Statistic AP VP Percent: 0.03 %
Brady Statistic AP VS Percent: 66 %
Brady Statistic AS VP Percent: 0.01 %
Brady Statistic AS VS Percent: 33.96 %
Brady Statistic RA Percent Paced: 66.11 %
Brady Statistic RV Percent Paced: 0.04 %
Date Time Interrogation Session: 20240122215604
Implantable Lead Connection Status: 753985
Implantable Lead Connection Status: 753985
Implantable Lead Implant Date: 20120501
Implantable Lead Implant Date: 20120501
Implantable Lead Location: 753859
Implantable Lead Location: 753860
Implantable Pulse Generator Implant Date: 20230109
Lead Channel Impedance Value: 342 Ohm
Lead Channel Impedance Value: 399 Ohm
Lead Channel Impedance Value: 418 Ohm
Lead Channel Impedance Value: 456 Ohm
Lead Channel Pacing Threshold Amplitude: 0.75 V
Lead Channel Pacing Threshold Amplitude: 1 V
Lead Channel Pacing Threshold Pulse Width: 0.4 ms
Lead Channel Pacing Threshold Pulse Width: 0.4 ms
Lead Channel Sensing Intrinsic Amplitude: 15.5 mV
Lead Channel Sensing Intrinsic Amplitude: 15.5 mV
Lead Channel Sensing Intrinsic Amplitude: 2.375 mV
Lead Channel Sensing Intrinsic Amplitude: 2.375 mV
Lead Channel Setting Pacing Amplitude: 1.5 V
Lead Channel Setting Pacing Amplitude: 2 V
Lead Channel Setting Pacing Pulse Width: 0.4 ms
Lead Channel Setting Sensing Sensitivity: 0.9 mV
Zone Setting Status: 755011
Zone Setting Status: 755011

## 2022-03-17 NOTE — Progress Notes (Signed)
Remote pacemaker transmission.   

## 2022-04-07 ENCOUNTER — Encounter: Payer: Self-pay | Admitting: Radiology

## 2022-05-26 ENCOUNTER — Ambulatory Visit (INDEPENDENT_AMBULATORY_CARE_PROVIDER_SITE_OTHER): Payer: BC Managed Care – PPO

## 2022-05-26 ENCOUNTER — Encounter: Payer: BC Managed Care – PPO | Admitting: Cardiovascular Disease

## 2022-05-26 DIAGNOSIS — R001 Bradycardia, unspecified: Secondary | ICD-10-CM

## 2022-05-26 LAB — CUP PACEART REMOTE DEVICE CHECK
Battery Remaining Longevity: 153 mo
Battery Voltage: 3.04 V
Brady Statistic AP VP Percent: 0.03 %
Brady Statistic AP VS Percent: 71.61 %
Brady Statistic AS VP Percent: 0.01 %
Brady Statistic AS VS Percent: 28.35 %
Brady Statistic RA Percent Paced: 71.71 %
Brady Statistic RV Percent Paced: 0.04 %
Date Time Interrogation Session: 20240417214946
Implantable Lead Connection Status: 753985
Implantable Lead Connection Status: 753985
Implantable Lead Implant Date: 20120501
Implantable Lead Implant Date: 20120501
Implantable Lead Location: 753859
Implantable Lead Location: 753860
Implantable Pulse Generator Implant Date: 20230109
Lead Channel Impedance Value: 323 Ohm
Lead Channel Impedance Value: 380 Ohm
Lead Channel Impedance Value: 437 Ohm
Lead Channel Impedance Value: 475 Ohm
Lead Channel Pacing Threshold Amplitude: 0.75 V
Lead Channel Pacing Threshold Amplitude: 0.875 V
Lead Channel Pacing Threshold Pulse Width: 0.4 ms
Lead Channel Pacing Threshold Pulse Width: 0.4 ms
Lead Channel Sensing Intrinsic Amplitude: 15.125 mV
Lead Channel Sensing Intrinsic Amplitude: 15.125 mV
Lead Channel Sensing Intrinsic Amplitude: 2.375 mV
Lead Channel Sensing Intrinsic Amplitude: 2.375 mV
Lead Channel Setting Pacing Amplitude: 1.5 V
Lead Channel Setting Pacing Amplitude: 2 V
Lead Channel Setting Pacing Pulse Width: 0.4 ms
Lead Channel Setting Sensing Sensitivity: 0.9 mV
Zone Setting Status: 755011
Zone Setting Status: 755011

## 2022-05-30 DIAGNOSIS — E785 Hyperlipidemia, unspecified: Secondary | ICD-10-CM | POA: Diagnosis not present

## 2022-05-30 DIAGNOSIS — E7841 Elevated Lipoprotein(a): Secondary | ICD-10-CM | POA: Diagnosis not present

## 2022-05-31 LAB — LIPID PANEL
Chol/HDL Ratio: 5.4 ratio — ABNORMAL HIGH (ref 0.0–5.0)
Cholesterol, Total: 163 mg/dL (ref 100–199)
HDL: 30 mg/dL — ABNORMAL LOW (ref >39)
LDL Chol Calc (NIH): 111 mg/dL — ABNORMAL HIGH (ref 0–99)
Triglycerides: 121 mg/dL (ref 0–149)
VLDL Cholesterol Cal: 22 mg/dL (ref 5–40)

## 2022-05-31 LAB — LIPOPROTEIN A (LPA): Lipoprotein (a): 37.9 nmol/L (ref <75.0)

## 2022-06-01 ENCOUNTER — Telehealth: Payer: Self-pay | Admitting: Emergency Medicine

## 2022-06-01 NOTE — Telephone Encounter (Signed)
Called, no answer, left message with call back number  Called to give results of latest lab work- (available on My Chart as well) and Dr Croitoru's recommendations.  Croitoru, Rachelle Hora, MD  Scheryl Marten, RN Cc: Babs Sciara, MD Good news, the LP(a) is in normal range. (Good cholesterol) remains relatively low, much unchanged.  There are no good medications for this, only weight loss and regular physical exercise help. The LDL cholesterol remains a little too high.  In the past he was able to achieve an LDL less than 100 by losing some weight.  The CT of the abdomen that they did last November does show some plaque buildup in the aorta.  I think we may need to start him on cholesterol-lowering medications.  I would recommend starting rosuvastatin 10 mg once daily. If he is reluctant to start cholesterol-lowering medicines, at a minimum I would recommend that he have a coronary CT calcium score so that we can better assess the risk of future cardiac events and the need for cholesterol-lowering medicines.

## 2022-06-06 ENCOUNTER — Ambulatory Visit: Payer: BC Managed Care – PPO | Admitting: Orthopedic Surgery

## 2022-06-06 ENCOUNTER — Encounter: Payer: Self-pay | Admitting: Orthopedic Surgery

## 2022-06-06 ENCOUNTER — Other Ambulatory Visit (INDEPENDENT_AMBULATORY_CARE_PROVIDER_SITE_OTHER): Payer: BC Managed Care – PPO

## 2022-06-06 VITALS — BP 160/95 | HR 74 | Ht 74.0 in | Wt 244.8 lb

## 2022-06-06 DIAGNOSIS — G8929 Other chronic pain: Secondary | ICD-10-CM

## 2022-06-06 DIAGNOSIS — M25512 Pain in left shoulder: Secondary | ICD-10-CM | POA: Diagnosis not present

## 2022-06-06 DIAGNOSIS — M7542 Impingement syndrome of left shoulder: Secondary | ICD-10-CM

## 2022-06-06 MED ORDER — MELOXICAM 7.5 MG PO TABS: 7.5000 mg | ORAL_TABLET | Freq: Every day | ORAL | 5 refills | Status: DC

## 2022-06-06 MED ORDER — METHYLPREDNISOLONE ACETATE 40 MG/ML IJ SUSP
40.0000 mg | Freq: Once | INTRAMUSCULAR | Status: AC
  Administered 2022-06-06: 40 mg via INTRA_ARTICULAR

## 2022-06-06 NOTE — Progress Notes (Unsigned)
Chief Complaint  Patient presents with   Shoulder Pain    LT/ has been painful x 3-4 months/ pain is progressively getting worse Had an MRI in 2019    HPI:  67 year old male with a pacemaker presents with recurrent left shoulder pain he had a cervical spine MRI back in 2015 he had an MRI of his shoulder in 2020 there was no cuff tear at that time  He has left shoulder pain and some posterior arm pain and some forearm pain as well as some trapezius pain but also has a feeling that the arm feels heavy or dead with painful forward elevation and abduction  He had more pain and then weakness in abduction and flexion he had normal external rotation and internal rotation of the glenohumeral joint  Imaging AC joint arthritis Normal glenohumeral joint  Assessment and plan  Seems to have more pain than weakness in the shoulder.  May need some nonoperative treatment to get the MRI to assess the rotator cuff  However I would recommend a subacromial injection and home exercise program and a 4-week follow-up to reassess the rotator cuff and pain that he is having  He has a lidocaine allergy so we will use BUPIVOCAINE  in our injection  We can use NSAID as well  Meds ordered this encounter  Medications   meloxicam (MOBIC) 7.5 MG tablet    Sig: Take 1 tablet (7.5 mg total) by mouth daily.    Dispense:  30 tablet    Refill:  5     Procedure note the subacromial injection shoulder left   Verbal consent was obtained to inject the  Left   Shoulder  Timeout was completed to confirm the injection site is a subacromial space of the  left  shoulder  Medication used Depo-Medrol 40 mg and quarter percent bupivacaine secondary to potential lidocaine allergy Anesthesia was provided by ethyl chloride  The injection was performed in the left  posterior subacromial space. After pinning the skin with alcohol and anesthetized the skin with ethyl chloride the subacromial space was injected using a  20-gauge needle. There were no complications  Sterile dressing was applied.

## 2022-06-09 ENCOUNTER — Encounter: Payer: Self-pay | Admitting: Cardiovascular Disease

## 2022-06-09 ENCOUNTER — Ambulatory Visit: Payer: BC Managed Care – PPO | Attending: Cardiovascular Disease | Admitting: Cardiovascular Disease

## 2022-06-09 VITALS — BP 138/80 | HR 64 | Ht 73.0 in | Wt 244.8 lb

## 2022-06-09 DIAGNOSIS — E669 Obesity, unspecified: Secondary | ICD-10-CM | POA: Diagnosis not present

## 2022-06-09 DIAGNOSIS — E785 Hyperlipidemia, unspecified: Secondary | ICD-10-CM

## 2022-06-09 DIAGNOSIS — R55 Syncope and collapse: Secondary | ICD-10-CM | POA: Diagnosis not present

## 2022-06-09 DIAGNOSIS — R0989 Other specified symptoms and signs involving the circulatory and respiratory systems: Secondary | ICD-10-CM

## 2022-06-09 DIAGNOSIS — Z950 Presence of cardiac pacemaker: Secondary | ICD-10-CM

## 2022-06-09 DIAGNOSIS — R030 Elevated blood-pressure reading, without diagnosis of hypertension: Secondary | ICD-10-CM

## 2022-06-09 NOTE — Patient Instructions (Signed)
Medication Instructions:  No changes *If you need a refill on your cardiac medications before your next appointment, please call your pharmacy*   Testing/Procedures:  Your physician has requested that you have a carotid duplex. This test is an ultrasound of the carotid arteries in your neck. It looks at blood flow through these arteries that supply the brain with blood. Allow one hour for this exam. There are no restrictions or special instructions. This will take place at [address] [facility], [address].    Dr Royann Shivers has ordered a CT coronary calcium score.   Test locations:  [facility]  [place] [facility]  This is $99 out of pocket.   Coronary CalciumScan A coronary calcium scan is an imaging test used to look for deposits of calcium and other fatty materials (plaques) in the inner lining of the blood vessels of the heart (coronary arteries). These deposits of calcium and plaques can partly clog and narrow the coronary arteries without producing any symptoms or warning signs. This puts a person at risk for a heart attack. This test can detect these deposits before symptoms develop. Tell a health care provider about: Any allergies you have. All medicines you are taking, including vitamins, herbs, eye drops, creams, and over-the-counter medicines. Any problems you or family members have had with anesthetic medicines. Any blood disorders you have. Any surgeries you have had. Any medical conditions you have. Whether you are pregnant or may be pregnant. What are the risks? Generally, this is a safe procedure. However, problems may occur, including: Harm to a pregnant woman and her unborn baby. This test involves the use of radiation. Radiation exposure can be dangerous to a pregnant woman and her unborn baby. If you are pregnant, you generally should not have this procedure done. Slight increase in the  risk of cancer. This is because of the radiation involved in the test. What happens before the procedure? No preparation is needed for this procedure. What happens during the procedure? You will undress and remove any jewelry around your neck or chest. You will put on a hospital gown. Sticky electrodes will be placed on your chest. The electrodes will be connected to an electrocardiogram (ECG) machine to record a tracing of the electrical activity of your heart. A CT scanner will take pictures of your heart. During this time, you will be asked to lie still and hold your breath for 2-3 seconds while a picture of your heart is being taken. The procedure may vary among health care providers and hospitals. What happens after the procedure? You can get dressed. You can return to your normal activities. It is up to you to get the results of your test. Ask your health care provider, or the department that is doing the test, when your results will be ready. Summary A coronary calcium scan is an imaging test used to look for deposits of calcium and other fatty materials (plaques) in the inner lining of the blood vessels of the heart (coronary arteries). Generally, this is a safe procedure. Tell your health care provider if you are pregnant or may be pregnant. No preparation is needed for this procedure. A CT scanner will take pictures of your heart. You can return to your normal activities after the scan is done. This information is not intended to replace advice given to you by your health care provider. Make sure you discuss any questions you have with your health  care provider. Document Released: 07/23/2007 Document Revised: 12/14/2015 Document Reviewed: 12/14/2015 Elsevier Interactive Patient Education  2017 [employer].    Follow-Up: At [facility], you and your health needs are our priority.  As part of our continuing mission to provide you with exceptional heart care, we have  created designated Provider Care Teams.  These Care Teams include your primary Cardiologist (physician) and Advanced Practice Providers (APPs -  Physician Assistants and Nurse Practitioners) who all work together to provide you with the care you need, when you need it.  We recommend signing up for the patient portal called "MyChart".  Sign up information is provided on this After Visit Summary.  MyChart is used to connect with patients for Virtual Visits (Telemedicine).  Patients are able to view lab/test results, encounter notes, upcoming appointments, etc.  Non-urgent messages can be sent to your provider as well.   To learn more about what you can do with MyChart, go to [email or web address].    Your next appointment:   1 year(s)- Physician Pacer Check  Provider:   Thurmon Fair, MD

## 2022-06-09 NOTE — Progress Notes (Signed)
Cardiology Office Note    Date:  06/09/2022   ID:  [name], DOB September 08, 1955, MRN [id number]  PCP:  Babs Sciara, MD  Cardiologist:   Thurmon Fair, MD   Chief Complaint  Patient presents with   Pacemaker Check     History of Present Illness:  [name] is a 67 y.o. 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  He underwent a pacemaker generator change out roughly 3 months ago (Medtronic Azure).  He has not had any syncopal events.  He has experienced a couple of episodes of dizziness that resolved by drinking more water.  He denies exertional dyspnea or chest discomfort.  He has occasional sticking sharp pain in his left breast area at rest, not during physical activity.  Denies orthopnea, PND, palpitations, lower extremity edema, claudication, focal neurological events.  Remains very physically active, consistently more than 8 hours a day as measured by his pacemaker.  Pacemaker function is normal.  He is not dependent.  He has 67% atrial pacing and never requires ventricular pacing.  Lead parameters are excellent.  Generator longevity is estimated at 12.7 years.  There have been no episodes of true mode switch (1 episode of reported most which is recorded and is actually clearly sinus tachycardia.).  CT of the abdomen and pelvis performed in September 2023 when he presented with fever chills and vomiting showed evidence of aortic atherosclerosis in the abdominal aorta, without aneurysm.  A remote health screening study showed some plaque in his carotids, without obstruction.  His most recent lipid profile continues to show very low  HDL cholesterol of only 30.  His LDL is 111.  Triglycerides 121 (on fenofibrate).  He does not have diabetes mellitus.  Creatinine is borderline at 1.26.  He is mildly obese with a BMI of 32. [name]  Past Medical History:  Diagnosis Date   Allergy    seasonal allergies   Arthritis    LEFT shoulder, RIGHT elbow   Barrett's esophagus    GERD (gastroesophageal reflux disease)    on meds   Hypercholesterolemia    triglycerides elevated-on meds   Hypotension    Pacemaker    Syncope     Past Surgical History:  Procedure Laterality Date   birthmark removal     right thigh   BLADDER SURGERY     stretched   CARDIAC CATHETERIZATION  09/15/2006   Recommendation - empiric anti-reflux therapy   CARDIOVASCULAR STRESS TEST  04/08/2011   No scintigraphic evidence of inducible myocardial ischemia. No lexiscan EKG changes. Non-diagnostic for ischemia.   CERVICAL SPINE SURGERY     plates and screws   COLONOSCOPY  2011   DJ-F/V-moviprep(exc)TICS/HPP   LOWER EXTREMITY ARTERIAL DOPPLER  10/02/2006   No evidence of thrombus or thrombphlebitis.   PACEMAKER INSERTION  06/08/2010   Medtronic Revo model #RVDR01, serial [id number]   POLYPECTOMY  2011   HPP   PPM GENERATOR CHANGEOUT N/A 02/15/2021   Procedure: PPM GENERATOR CHANGEOUT;  Surgeon: Thurmon Fair, MD;  Location: [facility];  Service: Cardiovascular;  Laterality: N/A;   TONSILLECTOMY     TRANSTHORACIC ECHOCARDIOGRAM  04/06/2011   EF 55-60%, normal   UPPER GASTROINTESTINAL ENDOSCOPY     WISDOM  TOOTH EXTRACTION      Current Medications: Outpatient Medications Prior to Visit  Medication Sig Dispense Refill   acetaminophen (TYLENOL) 325 MG tablet Take 2 tablets (650 mg total) by mouth every 6 (six) hours as needed for mild pain, fever or headache (or Fever >/= 101).     aspirin EC 81 MG tablet Take 81 mg by mouth daily.     fenofibrate (TRICOR) 145 MG tablet TAKE 1 TABLET (145 MG TOTAL) BY MOUTH DAILY. 90 tablet 3   meloxicam (MOBIC) 7.5  MG tablet Take 1 tablet (7.5 mg total) by mouth daily. 30 tablet 5   Multiple Vitamin (MULTIVITAMIN) tablet Take 1 tablet by mouth daily.     NON FORMULARY Take 1-3 capsules by mouth 2 (two) times daily.     omeprazole (PRILOSEC) 20 MG capsule TAKE 1 CAPSULE BY MOUTH EVERY DAY 90 capsule 3   Pseudoephedrine HCl (SUDAFED 12 HOUR PO) Take 1 tablet by mouth daily as needed (Sinus).     No facility-administered medications prior to visit.     Allergies:   Lidocaine, Other, Amlodipine besylate, Levofloxacin, and Sulfonamide derivatives   Social History   Socioeconomic History   Marital status: Married    Spouse name: Not on file   Number of children: 2   Years of education: Not on file   Highest education level: Not on file  Occupational History   Occupation: Education officer, museum, Estate agent, 3rd shift    Employer: [employer]  Tobacco Use   Smoking status: Former    Types: Cigarettes    Quit date: 11/27/1973    Years since quitting: 48.5   Smokeless tobacco: Never  Vaping Use   Vaping Use: Never used  Substance and Sexual Activity   Alcohol use: No   Drug use: No   Sexual activity: Yes  Other Topics Concern   Not on file  Social History Narrative   Lives with wife, who is a Engineer, civil (consulting).   Social Determinants of Health   Financial Resource Strain: Not on file  Food Insecurity: No Food Insecurity (11/05/2021)   Hunger Vital Sign    Worried About Running Out of Food in the Last Year: Never true    Ran Out of Food in the Last Year: Never true  Transportation Needs: No Transportation Needs (11/05/2021)   PRAPARE - Administrator, Civil Service (Medical): No    Lack of Transportation (Non-Medical): No  Physical Activity: Not on file  Stress: Not on file  Social Connections: Not on file     Family History:  The patient's family history includes COPD in his sister; Heart attack in his father and sister; Heart failure in his father; Hyperlipidemia in his brother and  sister; Hypertension in his brother; Lymphoma in his brother; Ovarian cancer in his mother; Throat cancer (age of onset: 18) in his maternal uncle.   ROS:   Please see the history of present illness.   All other systems are reviewed and are negative.   PHYSICAL EXAM:   VS:  BP 138/80   Pulse 64   Ht 6\' 1"  (1.854 m)   Wt 244 lb 12.8 oz (111 kg)   SpO2 97%   BMI 32.30 kg/m      General: Alert, oriented x3, no distress, muscular and fit, but also mildly obese.  Healthy left subclavian pacemaker site. Head: no evidence of trauma, PERRL, EOMI, no exophtalmos or lid lag, no myxedema, no xanthelasma; normal ears, nose and oropharynx  Neck: normal jugular venous pulsations and no hepatojugular reflux; brisk carotid pulses without delay and possible faint bilateral carotid bruits Chest: clear to auscultation, no signs of consolidation by percussion or palpation, normal fremitus, symmetrical and full respiratory excursions Cardiovascular: normal position and quality of the apical impulse, regular rhythm, normal first and second heart sounds, no murmurs, rubs or gallops Abdomen: no tenderness or distention, no masses by palpation, no abnormal pulsatility or arterial bruits, normal bowel sounds, no hepatosplenomegaly Extremities: no clubbing, cyanosis or edema; 2+ radial, ulnar and brachial pulses bilaterally; 2+ right femoral, posterior tibial and dorsalis pedis pulses; 2+ left femoral, posterior tibial and dorsalis pedis pulses; no subclavian or femoral bruits Neurological: grossly nonfocal Psych: Normal mood and affect    Wt Readings from Last 3 Encounters:  06/09/22 244 lb 12.8 oz (111 kg)  06/06/22 244 lb 12.8 oz (111 kg)  11/05/21 253 lb (114.8 kg)      Studies/Labs Reviewed:   EKG:  EKG is ordered today.  It shows atrial paced, ventricular sensed rhythm with a long AV delay at 248 ms, voltage criteria for LVH, no acute repolarization abnormalities, normal QTc 412 ms.    Recent  Labs: 11/05/2021: ALT 41 11/06/2021: BUN 24; Creatinine, Ser 1.26; Hemoglobin 12.8; Platelets 106; Potassium 3.8; Sodium 135   Lipid Panel    Component Value Date/Time   CHOL 163 05/30/2022 0817   TRIG 121 05/30/2022 0817   HDL 30 (L) 05/30/2022 0817   CHOLHDL 5.4 (H) 05/30/2022 0817   CHOLHDL 5.3 (H) 05/31/2018 0944   VLDL 30 12/28/2015 0827   LDLCALC 111 (H) 05/30/2022 0817   LDLCALC 101 (H) 05/31/2018 0944      ASSESSMENT:    1. Neurocardiogenic syncope   2. Dyslipidemia   3. Mild obesity   4. Pacemaker   5. Elevated blood pressure reading   6. Bruit       PLAN:  In order of problems listed above:  Neurocardiogenic syncope: He has not had full syncope ever since his pacemaker was initially implanted 12 years ago.  Occasional spells of dizziness resolved with hydration.[name] HLP: Continues have a very low HDL.  Triglycerides are normal on fenofibrate.  LDL is higher than desirable at 111.  Question need for statin.  Will go ahead and get coronary calcium score to better quantify plaque burden. Obesity: Strongly encourage weight loss by reducing intake of saturated fat and sugars and starches with high glycemic index. PPM: Normal device function.  Continue remote downloads every 3 months. HTN: Blood pressure is borderline high, likely to be in fully normal range if he loses some weight.  Medication Adjustments/Labs and Tests Ordered: Current medicines are reviewed at length with the patient today.  Concerns regarding medicines are outlined above.  Medication changes, Labs and Tests ordered today are listed in the Patient Instructions below. Patient Instructions  Medication Instructions:  No changes *If you need a refill on your cardiac medications before your next appointment, please call your pharmacy*   Testing/Procedures:  Your physician has requested that you have a carotid duplex. This test is an ultrasound of the carotid arteries in your neck. It looks at blood flow  through these arteries that supply the brain with blood. Allow one hour for this exam. There are no restrictions or special instructions. This will take place at [address] [facility], [address].    Dr Royann Shivers has ordered a CT coronary calcium score.   Test locations:  [facility]  [place] [facility]  This is $99 out of pocket.   Coronary CalciumScan A coronary calcium scan is an imaging test used to look for deposits of calcium and other fatty materials (plaques) in the inner lining of the blood vessels of the heart (coronary arteries). These deposits of calcium and plaques can partly clog and narrow the coronary arteries without producing any symptoms or warning signs. This puts a person at risk for a heart attack. This test can detect these deposits before symptoms develop. Tell a health care provider about: Any allergies you have. All medicines you are taking, including vitamins, herbs, eye drops, creams, and over-the-counter medicines. Any problems you or family members have had with anesthetic medicines. Any blood disorders you have. Any surgeries you have had. Any medical conditions you have. Whether you are pregnant or may be pregnant. What are the risks? Generally, this is a safe procedure. However, problems may occur, including: Harm to a pregnant woman and her unborn baby. This test involves the use of radiation. Radiation exposure can be dangerous to a pregnant woman and her unborn baby. If you are pregnant, you generally should not have this procedure done. Slight increase in the risk of cancer. This is because of the radiation involved in the test. What happens before the procedure? No preparation is needed for this procedure. What happens during the procedure? You will undress and remove any jewelry around your neck or chest. You will put on a hospital gown. Sticky electrodes will be  placed on your chest. The electrodes will be connected to an electrocardiogram (ECG) machine to record a tracing of the electrical activity of your heart. A CT scanner will take pictures of your heart. During this time, you will be asked to lie still and hold your breath for 2-3 seconds while a picture of your heart is being taken. The procedure may vary among health care providers and hospitals. What happens after the procedure? You can get dressed. You can return to your normal activities. It is up to you to get the results of your test. Ask your health care provider, or the department that is doing the test, when your results will be ready. Summary A coronary calcium scan is an imaging test used to look for deposits of calcium and other fatty materials (plaques) in the inner lining of the blood vessels of the heart (coronary arteries). Generally, this is a safe procedure. Tell your health care provider if you are pregnant or may be pregnant. No preparation is needed for this procedure. A CT scanner will take pictures of your heart. You can return to your normal activities after the scan is done. This information is not intended to replace advice given to you by your health care provider. Make sure you discuss any questions you have with your health care provider. Document Released: 07/23/2007 Document Revised: 12/14/2015 Document Reviewed: 12/14/2015 Elsevier Interactive Patient Education  2017 [employer].    Follow-Up: At [facility], you and your health needs are our priority.  As part of our continuing mission to provide you with exceptional heart care, we have created designated Provider Care Teams.  These Care Teams include your primary Cardiologist (physician) and Advanced Practice Providers (APPs -  Physician Assistants and Nurse Practitioners) who all work together to provide you with the care you need, when you need it.  We recommend signing up for the patient portal  called "MyChart".  Sign up  information is provided on this After Visit Summary.  MyChart is used to connect with patients for Virtual Visits (Telemedicine).  Patients are able to view lab/test results, encounter notes, upcoming appointments, etc.  Non-urgent messages can be sent to your provider as well.   To learn more about what you can do with MyChart, go to [email or web address].    Your next appointment:   1 year(s)- Physician Pacer Check  Provider:   Thurmon Fair, MD        Signed, Thurmon Fair, MD  06/09/2022 3:34 PM    [facility] [address] [place], [place], [place]  [address] Phone: [phone]; Fax: [phone]

## 2022-06-21 ENCOUNTER — Ambulatory Visit (HOSPITAL_COMMUNITY)
Admission: RE | Admit: 2022-06-21 | Discharge: 2022-06-21 | Disposition: A | Discharge status: Home / Self Care | Payer: BC Managed Care – PPO | Source: Ambulatory Visit | Attending: Cardiology | Admitting: Cardiology

## 2022-06-21 DIAGNOSIS — R0989 Other specified symptoms and signs involving the circulatory and respiratory systems: Secondary | ICD-10-CM | POA: Diagnosis not present

## 2022-06-27 NOTE — Progress Notes (Signed)
Remote pacemaker transmission.   

## 2022-07-05 IMAGING — MR MR LUMBAR SPINE W/O CM
4 of 5 series · 26 of 48 positions shown · non-contrast
Comparison: X-ray lumbar 08/26/2020

CLINICAL DATA: Patient complains of chronic midline low back pain
without sciatica.

EXAM:
MRI LUMBAR SPINE WITHOUT CONTRAST
TECHNIQUE: Multiplanar, multisequence MR imaging of the lumbar spine was
performed. No intravenous contrast was administered.

[Series 6: T2 · sagittal · 4.0mm · 0.73mm/px · 6 of 17 slices shown (1 of 2)]
[im 1/17]
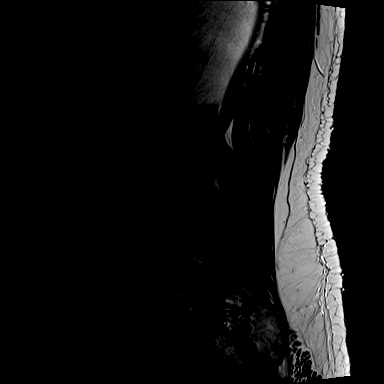
[im 4/17]
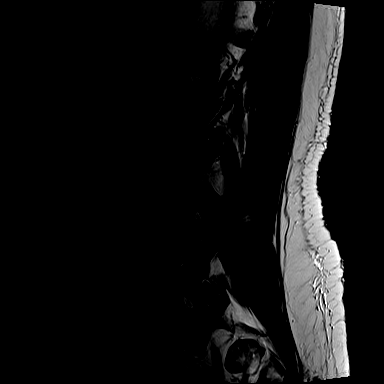
[im 7/17]
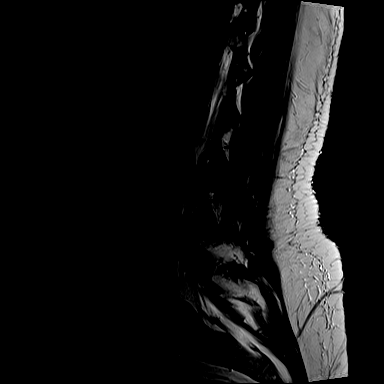
[im 10/17]
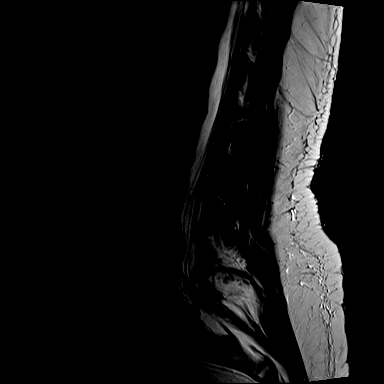
[im 13/17]
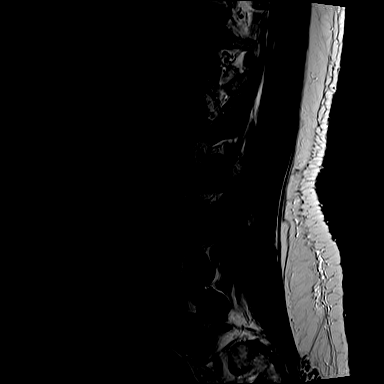
[im 17/17]
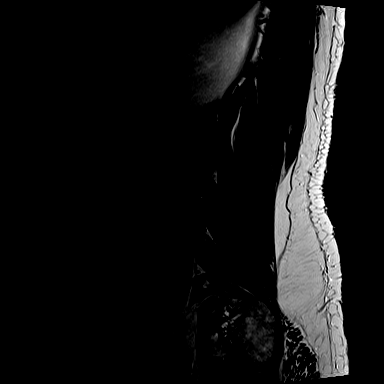

[Series 8: T1 · sagittal · 4.0mm · 0.88mm/px · 6 of 17 slices shown (1 of 2)]
[im 1/17]
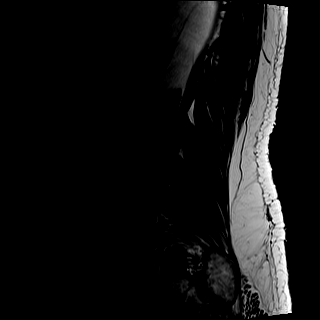
[im 4/17]
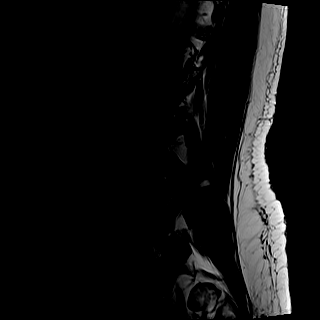
[im 7/17]
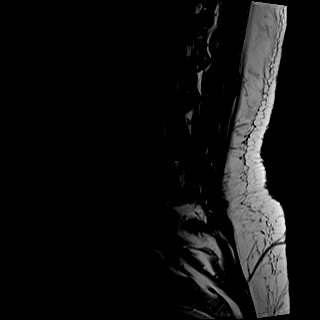
[im 10/17]
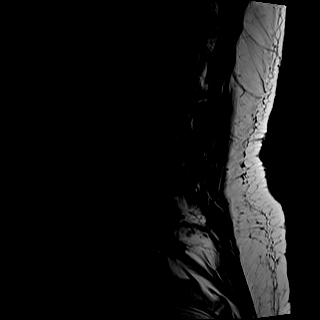
[im 13/17]
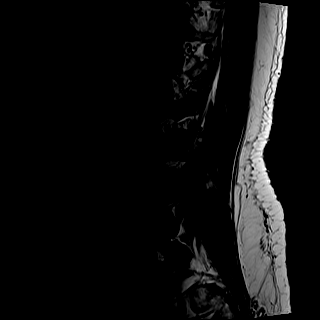
[im 17/17]
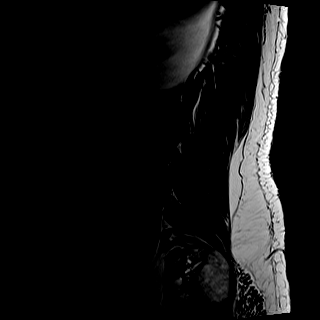

[Series 9: T2 · axial · 4.0mm · 0.57mm/px · z∈[-101,+160]mm · 9 of 40 slices shown (2 of 2)]
[im 1/40]
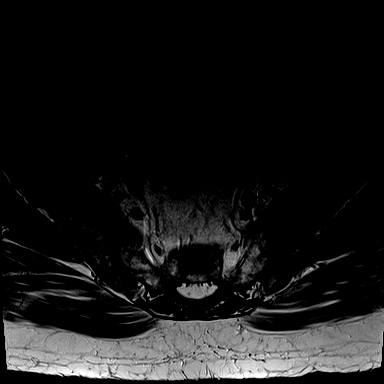
[im 6/40]
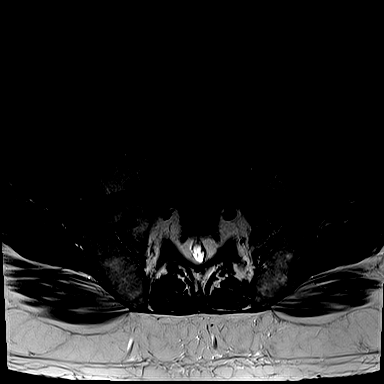
[im 12/40]
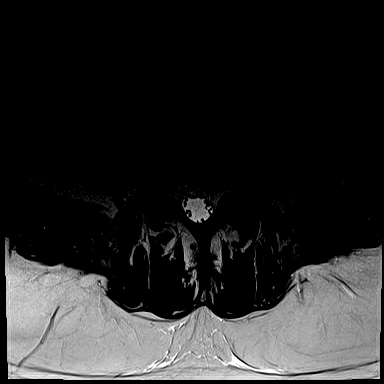
[im 17/40]
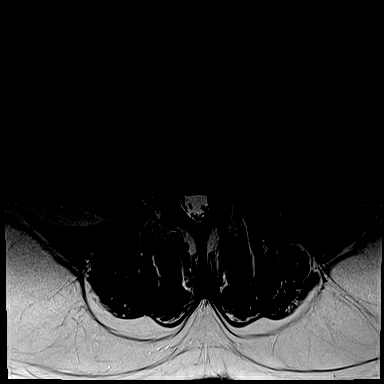
[im 20/40]
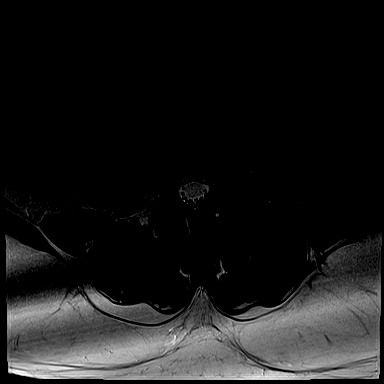
[im 23/40]
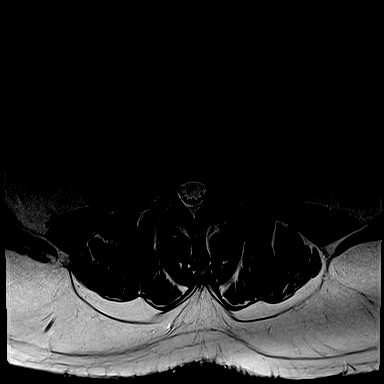
[im 28/40]
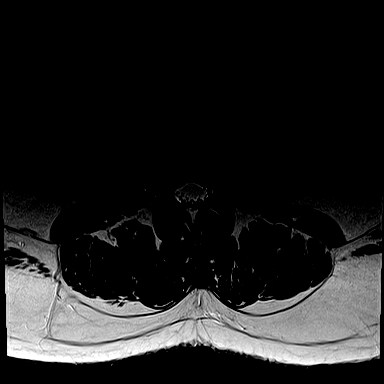
[im 34/40]
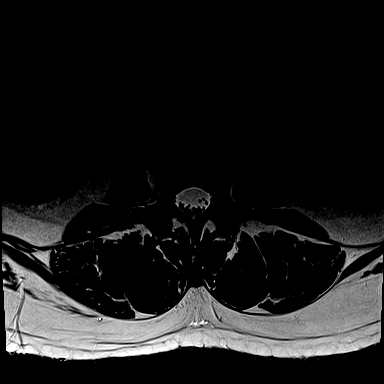
[im 40/40]
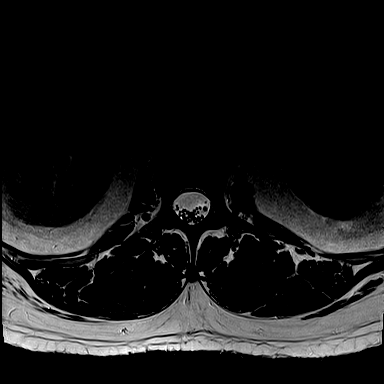

[Series 10: T1 · axial · 4.0mm · 0.34mm/px · z∈[-101,+130]mm · 5 of 40 slices shown (2 of 2)]
[im 1/40]
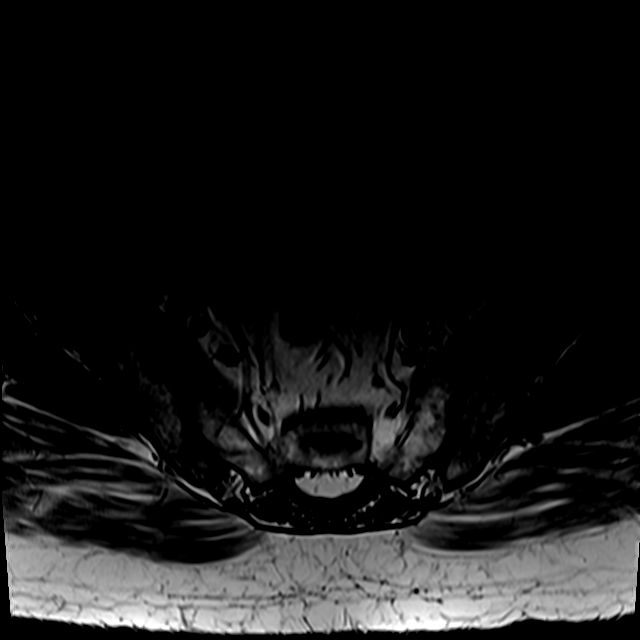
[im 6/40]
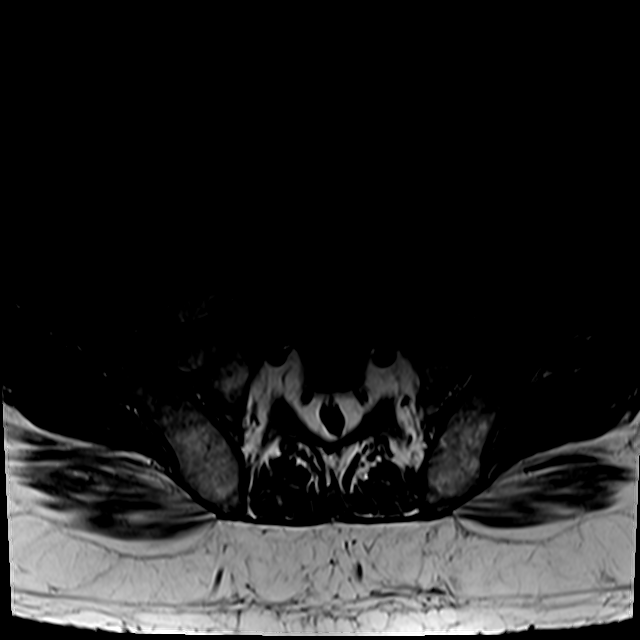
[im 12/40]
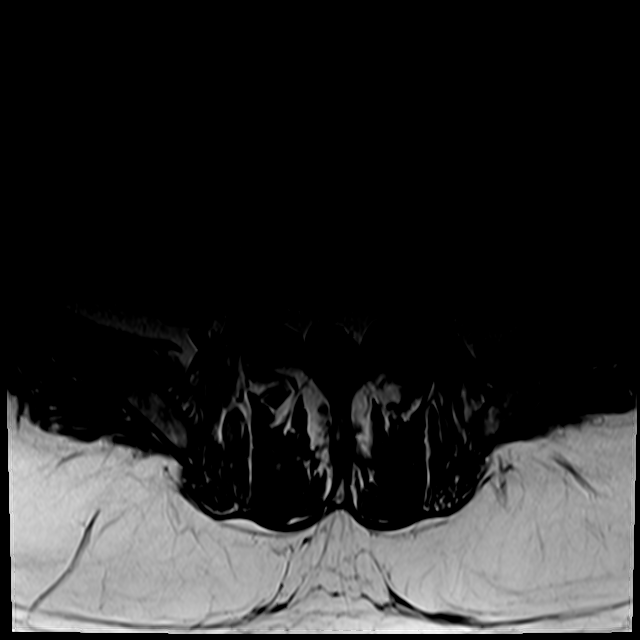
[im 20/40]
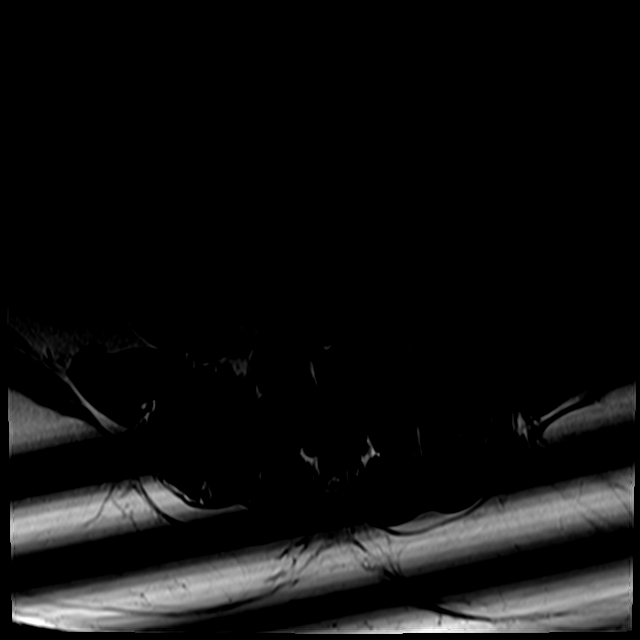
[im 34/40]
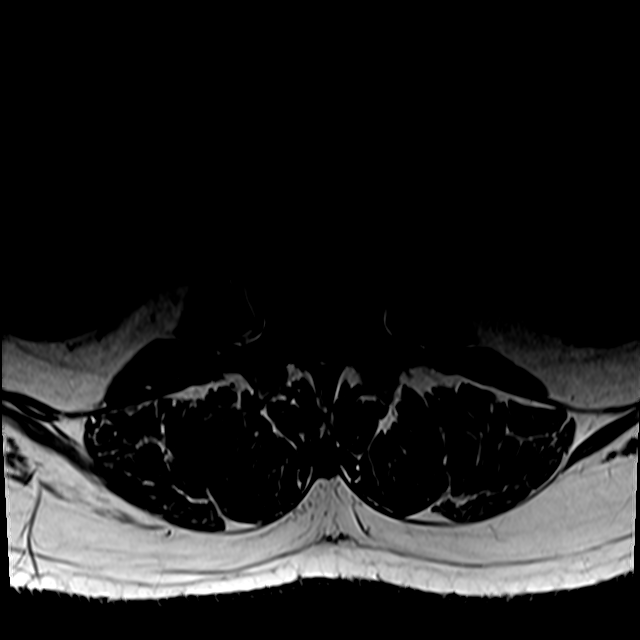

[26 of 48 positions shown; findings below may reference images not displayed]

FINDINGS: Segmentation:  Standard.

Alignment:  Physiologic.

Vertebrae: No fracture, evidence of discitis, or suspicious bone
lesion.

Conus medullaris and cauda equina: Conus extends to the L1 level.
Conus and cauda equina appear normal.

Paraspinal and other soft tissues: Negative.

Disc levels:

T12-L1: Sagittal sequences only. No significant disc protrusion,
foraminal stenosis, or canal stenosis.

L1-L2: Shallow right paracentral disc protrusion. No foraminal or
canal stenosis.

L2-L3: Mild annular disc bulge. Minimal facet arthropathy. No
foraminal or canal stenosis.

L3-L4: Mild annular disc bulge. Minimal facet arthropathy. No
foraminal or canal stenosis.

L4-L5: Mild diffuse disc bulge with small central disc protrusion.
Minimal facet arthropathy. Mild narrowing of the bilateral
subarticular recesses without evidence of neural impingement. No
canal stenosis. No foraminal stenosis.

L5-S1: No significant disc protrusion. Mild-moderate bilateral facet
arthropathy. No foraminal or canal stenosis.
IMPRESSION: 1. Mild multilevel degenerative changes of the lumbar spine. No
significant foraminal or canal stenosis at any level.
2. Mild narrowing of the bilateral subarticular recesses at L4-L5
without evidence of neural impingement.

## 2022-07-07 ENCOUNTER — Ambulatory Visit: Payer: BC Managed Care – PPO | Admitting: Orthopedic Surgery

## 2022-07-07 ENCOUNTER — Encounter: Payer: Self-pay | Admitting: Orthopedic Surgery

## 2022-07-07 VITALS — BP 155/96 | HR 87 | Ht 73.0 in | Wt 243.0 lb

## 2022-07-07 DIAGNOSIS — M75112 Incomplete rotator cuff tear or rupture of left shoulder, not specified as traumatic: Secondary | ICD-10-CM | POA: Diagnosis not present

## 2022-07-07 DIAGNOSIS — M7542 Impingement syndrome of left shoulder: Secondary | ICD-10-CM | POA: Diagnosis not present

## 2022-07-07 DIAGNOSIS — M25512 Pain in left shoulder: Secondary | ICD-10-CM

## 2022-07-07 NOTE — Progress Notes (Signed)
   BP (!) 155/96   Pulse 87   Ht 6\' 1"  (1.854 m)   Wt 243 lb (110.2 kg)   BMI 32.06 kg/m   Body mass index is 32.06 kg/m.  Chief Complaint  Patient presents with   Shoulder Pain    Left      DOI/DOS/DOLV Date: 06/06/22  Unchanged  Prior tx:  NSAIDS and/or analgesics and theraputic injections as appropriate//meloxicam  Baruch did not improve after subacromial injection and meloxicam  We will go ahead and get the MRI  We reviewed his symptoms with him again he has pain around the deltoid [place] posteriorly has some pain when he lies on his left shoulder and when he lifts his arm  I think the MRI of the shoulder is more likely to give [place] the information we need he will follow-up by phone call once I get the results back  Encounter Diagnoses  Name Primary?   Acute pain of left shoulder Yes   Impingement syndrome of left shoulder    Nontraumatic incomplete tear of left rotator cuff

## 2022-07-07 NOTE — Patient Instructions (Signed)
While we are working on your approval for MRI please go ahead and call to schedule your appointment with [facility] within at least one (1) week.   Central Scheduling [phone]  

## 2022-07-12 ENCOUNTER — Ambulatory Visit (HOSPITAL_BASED_OUTPATIENT_CLINIC_OR_DEPARTMENT_OTHER)
Admission: RE | Admit: 2022-07-12 | Discharge: 2022-07-12 | Disposition: A | Discharge status: Home / Self Care | Payer: BC Managed Care – PPO | Source: Ambulatory Visit | Attending: Cardiovascular Disease | Admitting: Cardiovascular Disease

## 2022-07-12 DIAGNOSIS — E785 Hyperlipidemia, unspecified: Secondary | ICD-10-CM | POA: Insufficient documentation

## 2022-07-13 ENCOUNTER — Telehealth: Payer: Self-pay | Admitting: Orthopedic Surgery

## 2022-07-13 NOTE — Telephone Encounter (Signed)
Dr. Mort Sawyers pt - spoke w/the patient, he stated that our office was supposed to get his MRI approved.  He would like a call back with an update on this.  [phone]

## 2022-07-13 NOTE — Telephone Encounter (Signed)
I called to let him know to call and schedule We will work on approval if there is a problem will let him know.

## 2022-07-13 NOTE — Telephone Encounter (Signed)
He was told While we are working on your approval for MRI please go ahead and call to schedule your appointment with [facility] within at least one (1) week.   I will call him

## 2022-07-14 ENCOUNTER — Telehealth: Payer: Self-pay | Admitting: Cardiovascular Disease

## 2022-07-14 NOTE — Telephone Encounter (Signed)
Patient's wife is calling requesting a callback to discuss the patient's CT results.  Please advise.

## 2022-07-18 ENCOUNTER — Ambulatory Visit: Payer: BC Managed Care – PPO | Admitting: Urology

## 2022-07-25 ENCOUNTER — Telehealth: Payer: Self-pay | Admitting: Cardiovascular Disease

## 2022-07-25 ENCOUNTER — Other Ambulatory Visit: Payer: Self-pay | Admitting: Cardiovascular Disease

## 2022-07-25 MED ORDER — FENOFIBRATE 48 MG PO TABS
48.0000 mg | ORAL_TABLET | Freq: Every day | ORAL | 3 refills | Status: DC
Start: 2022-07-25 — End: 2023-07-28

## 2022-07-25 MED ORDER — ROSUVASTATIN CALCIUM 10 MG PO TABS: 10.0000 mg | ORAL_TABLET | Freq: Every day | ORAL | 3 refills | Status: DC

## 2022-07-25 NOTE — Telephone Encounter (Signed)
Spoke with his wife. He has been having some symptoms that suggest stable angina. Based on that and the calcium score CT, I think we should discuss a heart catheterization. He is currently working 3rd shift and comes off work at [employer]. Can I please see him on Friday 21st at 7:40AM? I know it is earlier than usual but that way he can still get some sleep afterwards.

## 2022-07-25 NOTE — Telephone Encounter (Signed)
Wife has concerns regarding CT results.  She wants to know how do we know there is not a blockage already.  She is very concerned as the is heart disease in his family.  At times he has chest discomfort even though goes away and he is not having any at this time, it is still concerning.   His sister died from what is thought to be heart attack. She would like to see Dr C or talk to him just as she is worried. Would like to know if there should be other test to follow up on this.  Please advise

## 2022-07-25 NOTE — Telephone Encounter (Signed)
Pt's wife is calling regarding abnormal test and would like to talk to the nurse about getting an appointment in the next two weeks. Does not want to see the APP. Please advise.

## 2022-07-29 ENCOUNTER — Ambulatory Visit: Payer: BC Managed Care – PPO | Attending: Cardiovascular Disease | Admitting: Cardiovascular Disease

## 2022-07-29 ENCOUNTER — Other Ambulatory Visit: Payer: Self-pay | Admitting: Emergency Medicine

## 2022-07-29 VITALS — BP 136/88 | HR 74 | Resp 18 | Ht 74.0 in | Wt 242.6 lb

## 2022-07-29 DIAGNOSIS — R079 Chest pain, unspecified: Secondary | ICD-10-CM | POA: Diagnosis not present

## 2022-07-29 DIAGNOSIS — E782 Mixed hyperlipidemia: Secondary | ICD-10-CM

## 2022-07-29 DIAGNOSIS — Z950 Presence of cardiac pacemaker: Secondary | ICD-10-CM | POA: Diagnosis not present

## 2022-07-29 DIAGNOSIS — I25118 Atherosclerotic heart disease of native coronary artery with other forms of angina pectoris: Secondary | ICD-10-CM

## 2022-07-29 DIAGNOSIS — I209 Angina pectoris, unspecified: Secondary | ICD-10-CM | POA: Diagnosis not present

## 2022-07-29 DIAGNOSIS — R55 Syncope and collapse: Secondary | ICD-10-CM

## 2022-07-29 LAB — BASIC METABOLIC PANEL: Potassium: 4.2 mmol/L (ref 3.5–5.2)

## 2022-07-29 LAB — CBC

## 2022-07-29 NOTE — Progress Notes (Signed)
 Cardiology Office Note    Date:  07/29/2022   ID:  Knight D Dezarn, DOB 06/06/1955, MRN [id number]  PCP:  Luking, Scott A, MD  Cardiologist:   Keishawn Darsey, MD   Chief Complaint  Patient presents with   Chest Pain     History of Present Illness:  [name] is a 67 y.o. 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  He underwent a pacemaker generator change out roughly 3 months ago (Medtronic Azure).  He has recently developed symptoms concerning for exertional angina - inframammary tightness, sometimes with dyspnea, resolves after 2 minutes of rest.    He underwent a calcium score that was quite high (over 1100, 93rd percentile).  The CT also showed mildly enlarged ascending aorta at 43 mm with aortic atherosclerosis.  He has a strong family history of early onset CAD (his sister died from a heart attack around age 67, half sister had CABG before age 50).  Previous evaluation for coronary disease included a cardiac catheterization in 2008 (no coronary stenoses, "slow flow" in all the coronary arteries.  And a low risk nuclear stress test in 2013.  It has been over 10 years since he has had another coronary evaluation.  He has not had any recent syncope or near syncope.  He denies exertional dyspnea, orthopnea, PND, lower extremity edema or claudication.  He has had problems with pain in his left shoulder that did not improve with NSAIDs and a shoulder injection.  He has an incomplete left rotator cuff tear and impingement syndrome.  He is scheduled for an MRI on August 20.  Pacemaker function is normal.  Most recent download was 05/25/2022.  He  is not device dependent.  He has approximately 17 % atrial pacing and never requires ventricular pacing.  Lead parameters are excellent.  Generator longevity is estimated at >12 years.  There have been no episodes of true mode switch or high ventricular rate.  CT of the abdomen and pelvis performed in September 2023 when he presented with fever chills and vomiting showed evidence of aortic atherosclerosis in the abdominal aorta, without aneurysm.  A remote health screening study showed some plaque in his carotids, without obstruction.  His most recent lipid profile continues to show very low HDL cholesterol of only 30.  His LDL is 111.  Triglycerides 121 (on fenofibrate).  LP(a) is not elevated.  He does not have diabetes mellitus.  Creatinine is borderline at 1.26.  He is mildly obese with a BMI of 32. .  Past Medical History:  Diagnosis Date   Allergy    seasonal allergies   Arthritis    LEFT shoulder, RIGHT elbow   Barrett's esophagus    GERD (gastroesophageal reflux disease)    on meds   Hypercholesterolemia    triglycerides elevated-on meds   Hypotension    Pacemaker    Syncope     Past Surgical History:  Procedure Laterality Date   birthmark removal     right thigh   BLADDER SURGERY     stretched   CARDIAC CATHETERIZATION  09/15/2006   Recommendation - empiric anti-reflux therapy   CARDIOVASCULAR STRESS TEST  04/08/2011   No scintigraphic evidence of inducible myocardial ischemia. No lexiscan EKG   changes. Non-diagnostic for ischemia.   CERVICAL SPINE SURGERY     plates and screws   COLONOSCOPY  2011   DJ-F/V-moviprep(exc)TICS/HPP   LOWER EXTREMITY ARTERIAL DOPPLER  10/02/2006   No evidence of thrombus or thrombphlebitis.   PACEMAKER INSERTION  06/08/2010   Medtronic Revo model #RVDR01, serial #[id number]   POLYPECTOMY  2011   HPP   PPM GENERATOR CHANGEOUT N/A 02/15/2021   Procedure: PPM GENERATOR CHANGEOUT;  Surgeon: Harvard Zeiss, MD;  Location: [facility];   Service: Cardiovascular;  Laterality: N/A;   TONSILLECTOMY     TRANSTHORACIC ECHOCARDIOGRAM  04/06/2011   EF 55-60%, normal   UPPER GASTROINTESTINAL ENDOSCOPY     WISDOM TOOTH EXTRACTION      Current Medications: Outpatient Medications Prior to Visit  Medication Sig Dispense Refill   fenofibrate (TRICOR) 48 MG tablet Take 1 tablet (48 mg total) by mouth daily. 90 tablet 3   rosuvastatin (CRESTOR) 10 MG tablet Take 1 tablet (10 mg total) by mouth daily. 90 tablet 3   acetaminophen (TYLENOL) 325 MG tablet Take 2 tablets (650 mg total) by mouth every 6 (six) hours as needed for mild pain, fever or headache (or Fever >/= 101).     aspirin EC 81 MG tablet Take 81 mg by mouth daily.     Multiple Vitamin (MULTIVITAMIN) tablet Take 1 tablet by mouth daily.     NON FORMULARY Take 1-3 capsules by mouth 2 (two) times daily. (Patient not taking: Reported on 07/07/2022)     Pseudoephedrine HCl (SUDAFED 12 HOUR PO) Take 1 tablet by mouth daily as needed (Sinus).     meloxicam (MOBIC) 7.5 MG tablet Take 1 tablet (7.5 mg total) by mouth daily. 30 tablet 5   omeprazole (PRILOSEC) 20 MG capsule TAKE 1 CAPSULE BY MOUTH EVERY DAY 90 capsule 3   No facility-administered medications prior to visit.     Allergies:   Lidocaine, Other, Amlodipine besylate, Levofloxacin, and Sulfonamide derivatives   Social History   Socioeconomic History   Marital status: Married    Spouse name: Not on file   Number of children: 2   Years of education: Not on file   Highest education level: Not on file  Occupational History   Occupation: warehouse tech, forklift operator, 3rd shift    Employer: [employer]  Tobacco Use   Smoking status: Former    Types: Cigarettes    Quit date: 11/27/1973    Years since quitting: 48.7   Smokeless tobacco: Never  Vaping Use   Vaping Use: Never used  Substance and Sexual Activity   Alcohol use: No   Drug use: No   Sexual activity: Yes  Other Topics Concern   Not on file   Social History Narrative   Lives with wife, who is a nurse.   Social Determinants of Health   Financial Resource Strain: Not on file  Food Insecurity: No Food Insecurity (11/05/2021)   Hunger Vital Sign    Worried About Running Out of Food in the Last Year: Never true    Ran Out of Food in the Last Year: Never true  Transportation Needs: No Transportation Needs (11/05/2021)   PRAPARE - Transportation    Lack of Transportation (Medical): No    Lack of Transportation (Non-Medical): No  Physical Activity: Not on file  Stress: Not on file  Social Connections: Not on file     Family History:  The patient's family history includes COPD in his sister; Heart attack in his   father and sister; Heart failure in his father; Hyperlipidemia in his brother and sister; Hypertension in his brother; Lymphoma in his brother; Ovarian cancer in his mother; Throat cancer (age of onset: 68) in his maternal uncle.   ROS:   Please see the history of present illness.   All other systems are reviewed and are negative.   PHYSICAL EXAM:   VS:  BP 136/88 (BP Location: Right Arm, Patient Position: Sitting)   Pulse 74   Resp 18   Ht 6' 2" (1.88 m)   Wt 242 lb 9.6 oz (110 kg)   SpO2 96%   BMI 31.15 kg/m      General: Alert, oriented x3, no distress, muscular and fit, but also mildly obese.  Healthy left subclavian pacemaker site. Head: no evidence of trauma, PERRL, EOMI, no exophtalmos or lid lag, no myxedema, no xanthelasma; normal ears, nose and oropharynx Neck: normal jugular venous pulsations and no hepatojugular reflux; brisk carotid pulses without delay and possible faint bilateral carotid bruits Chest: clear to auscultation, no signs of consolidation by percussion or palpation, normal fremitus, symmetrical and full respiratory excursions Cardiovascular: normal position and quality of the apical impulse, regular rhythm, normal first and second heart sounds, no murmurs, rubs or gallops Abdomen: no  tenderness or distention, no masses by palpation, no abnormal pulsatility or arterial bruits, normal bowel sounds, no hepatosplenomegaly Extremities: no clubbing, cyanosis or edema; 2+ radial, ulnar and brachial pulses bilaterally; 2+ right femoral, posterior tibial and dorsalis pedis pulses; 2+ left femoral, posterior tibial and dorsalis pedis pulses; no subclavian or femoral bruits Neurological: grossly nonfocal Psych: Normal mood and affect    Wt Readings from Last 3 Encounters:  07/29/22 242 lb 9.6 oz (110 kg)  07/07/22 243 lb (110.2 kg)  06/09/22 244 lb 12.8 oz (111 kg)      Studies/Labs Reviewed:   EKG:  EKG is ordered today.  Shows normal sinus rhythm, without ischemic repolarization abnormalities.  Recent Labs: 11/05/2021: ALT 41 11/06/2021: BUN 24; Creatinine, Ser 1.26; Hemoglobin 12.8; Platelets 106; Potassium 3.8; Sodium 135   Lipid Panel    Component Value Date/Time   CHOL 163 05/30/2022 0817   TRIG 121 05/30/2022 0817   HDL 30 (L) 05/30/2022 0817   CHOLHDL 5.4 (H) 05/30/2022 0817   CHOLHDL 5.3 (H) 05/31/2018 0944   VLDL 30 12/28/2015 0827   LDLCALC 111 (H) 05/30/2022 0817   LDLCALC 101 (H) 05/31/2018 0944      ASSESSMENT:    1. Coronary artery disease of native artery of native heart with stable angina pectoris (HCC)   2. Mixed hyperlipidemia   3. Neurocardiogenic syncope   4. Pacemaker        PLAN:  In order of problems listed above:  Exertional angina: Coupled with his very high calcium score, this is concerning for development of significant coronary stenoses.  Calcium score is very high and his pacemaker leads cause some artifact that may interfere with CT angiography as well.  I think he will be best served by repeat invasive coronary angiography (this was last performed 16 years ago). This procedure has been fully reviewed with the patient and informed consent has been obtained.  Avoid NSAIDs 24 hours before the catheterization procedure.  Continue  aspirin. 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  He is obese and probably has metabolic syndrome but has not developed diabetes mellitus, probably because he is very physically active.  Triglycerides are normal on   fenofibrate.  LDL is higher than desirable at 111.  With the new information about his elevated coronary calcium score he should start statin therapy to target LDL less than 70, regardless of whether or not we find obstructive CAD on catheterization.  Will start rosuvastatin and reduce the low-dose fenofibrate 48 mg daily, at least for the time being.  Recheck labs in 3 months. Obesity: Strongly encourage weight loss by reducing intake of saturated fat and sugars and starches with high glycemic index. Neurocardiogenic syncope: He has not had full syncope ever since his pacemaker was initially implanted 12 years ago.  Occasional spells of dizziness resolved with hydration. PPM: Normal device function.  Continue remote downloads every 3 months. HTN: Blood pressure is borderline high, likely to be in fully normal range if he loses some weight.  Informed Consent   Shared Decision Making/Informed Consent The risks [stroke (1 in 1000), death (1 in 1000), kidney failure [usually temporary] (1 in 500), bleeding (1 in 200), allergic reaction [possibly serious] (1 in 200)], benefits (diagnostic support and management of coronary artery disease) and alternatives of a cardiac catheterization were discussed in detail with Mr. Detwiler and he is willing to proceed.      Medication Adjustments/Labs and Tests Ordered: Current medicines are reviewed at length with the patient today.  Concerns regarding medicines are outlined above.  Medication changes, Labs and Tests ordered today are listed in the Patient Instructions below. Patient Instructions  Medication Instructions:  No changes *If you need a refill on your cardiac medications before your next appointment, please call your  pharmacy*   Lab Work: CBC, BMET- today If you have labs (blood work) drawn today and your tests are completely normal, you will receive your results only by: MyChart Message (if you have MyChart) OR A paper copy in the mail If you have any lab test that is abnormal or we need to change your treatment, we will call you to review the results.   Testing/Procedures:  [facility] A DEPT OF [place] [id number] [place] [address] Dept: [phone] Loc: [phone]  Carlton D Spratlin  07/29/2022  You are scheduled for a Cardiac Catheterization on Tuesday, June 25 with Dr. Thomas Kelly.  1. Please arrive at the North Tower (Main Entrance A) at [facility]: [address] [place], [place] [address] at 8:30 AM (This time is  hour(s) before your procedure to ensure your preparation). Free valet parking service is available. You will check in at ADMITTING. The support person will be asked to wait in the waiting room.  It is OK to have someone drop you off and come back when you are ready to be discharged.    Special note: Every effort is made to have your procedure done on time. Please understand that emergencies sometimes delay scheduled procedures.  2. Diet: Do not eat solid foods after midnight.  The patient may have clear liquids until 5am upon the day of the procedure.  3. Labs: You will need to have blood drawn today  4. Medication instructions in preparation for your procedure:   Contrast Allergy: No  On the morning of your procedure, take your Aspirin 81 mg and any morning medicines NOT listed above.  You may use sips of water.  5. Plan to go home the same day, you will only stay overnight if medically necessary. 6. Bring a current list of your medications   and current insurance cards. 7. You MUST have a responsible person to drive you home. 8. Someone MUST be with  you the first 24 hours after you arrive home or your discharge will be delayed. 9. Please wear clothes that are easy to get on and off and wear slip-on shoes.  Thank you for allowing us to care for you!   -- [facility]    Follow-Up: At [facility], you and your health needs are our priority.  As part of our continuing mission to provide you with exceptional heart care, we have created designated Provider Care Teams.  These Care Teams include your primary Cardiologist (physician) and Advanced Practice Providers (APPs -  Physician Assistants and Nurse Practitioners) who all work together to provide you with the care you need, when you need it.  We recommend signing up for the patient portal called "MyChart".  Sign up information is provided on this After Visit Summary.  MyChart is used to connect with patients for Virtual Visits (Telemedicine).  Patients are able to view lab/test results, encounter notes, upcoming appointments, etc.  Non-urgent messages can be sent to your provider as well.   To learn more about what you can do with MyChart, go to [email or web address].    Your next appointment:    Will schedule appt after Cath  Provider:   Kiannah Grunow, MD       Signed, Melat Wrisley, MD  07/29/2022 8:27 AM    [facility] [address], [place], [place]  [address] Phone: [phone]; Fax: [phone]  

## 2022-07-29 NOTE — H&P (View-Only) (Signed)
Cardiology Office Note    Date:  07/29/2022   ID:  [name], DOB 16-Aug-1955, MRN [id number]  PCP:  Babs Sciara, MD  Cardiologist:   Thurmon Fair, MD   Chief Complaint  Patient presents with   Chest Pain     History of Present Illness:  [name] is a 67 y.o. male history of neurocardiogenic syncope that has resolved following pacemaker implantation in 2012 (generator change out Medtronic as January 2023, leads Medtronic [phone], MRI conditional system), although he still has occasional presyncopal symptoms likely related to vasovagal depressor mechanism, hypercholesterolemia, low HDL, obesity returning for pacemaker check and routine follow-up.  He underwent a pacemaker generator change out roughly 3 months ago (Medtronic Azure).  He has recently developed symptoms concerning for exertional angina - inframammary tightness, sometimes with dyspnea, resolves after 2 minutes of rest.    He underwent a calcium score that was quite high (over 1100, 93rd percentile).  The CT also showed mildly enlarged ascending aorta at 43 mm with aortic atherosclerosis.  He has a strong family history of early onset CAD (his sister died from a heart attack around age 28, half sister had CABG before age 16).  Previous evaluation for coronary disease included a cardiac catheterization in 2008 (no coronary stenoses, "slow flow" in all the coronary arteries.  And a low risk nuclear stress test in 2013.  It has been over 10 years since he has had another coronary evaluation.  He has not had any recent syncope or near syncope.  He denies exertional dyspnea, orthopnea, PND, lower extremity edema or claudication.  He has had problems with pain in his left shoulder that did not improve with NSAIDs and a shoulder injection.  He has an incomplete left rotator cuff tear and impingement syndrome.  He is scheduled for an MRI on August 20.  Pacemaker function is normal.  Most recent download was 05/25/2022.  He  is not device dependent.  He has approximately 17 % atrial pacing and never requires ventricular pacing.  Lead parameters are excellent.  Generator longevity is estimated at >12 years.  There have been no episodes of true mode switch or high ventricular rate.  CT of the abdomen and pelvis performed in September 2023 when he presented with fever chills and vomiting showed evidence of aortic atherosclerosis in the abdominal aorta, without aneurysm.  A remote health screening study showed some plaque in his carotids, without obstruction.  His most recent lipid profile continues to show very low HDL cholesterol of only 30.  His LDL is 111.  Triglycerides 121 (on fenofibrate).  LP(a) is not elevated.  He does not have diabetes mellitus.  Creatinine is borderline at 1.26.  He is mildly obese with a BMI of 32. [name]  Past Medical History:  Diagnosis Date   Allergy    seasonal allergies   Arthritis    LEFT shoulder, RIGHT elbow   Barrett's esophagus    GERD (gastroesophageal reflux disease)    on meds   Hypercholesterolemia    triglycerides elevated-on meds   Hypotension    Pacemaker    Syncope     Past Surgical History:  Procedure Laterality Date   birthmark removal     right thigh   BLADDER SURGERY     stretched   CARDIAC CATHETERIZATION  09/15/2006   Recommendation - empiric anti-reflux therapy   CARDIOVASCULAR STRESS TEST  04/08/2011   No scintigraphic evidence of inducible myocardial ischemia. No lexiscan EKG  changes. Non-diagnostic for ischemia.   CERVICAL SPINE SURGERY     plates and screws   COLONOSCOPY  2011   DJ-F/V-moviprep(exc)TICS/HPP   LOWER EXTREMITY ARTERIAL DOPPLER  10/02/2006   No evidence of thrombus or thrombphlebitis.   PACEMAKER INSERTION  06/08/2010   Medtronic Revo model #RVDR01, serial [id number]   POLYPECTOMY  2011   HPP   PPM GENERATOR CHANGEOUT N/A 02/15/2021   Procedure: PPM GENERATOR CHANGEOUT;  Surgeon: Thurmon Fair, MD;  Location: [facility];   Service: Cardiovascular;  Laterality: N/A;   TONSILLECTOMY     TRANSTHORACIC ECHOCARDIOGRAM  04/06/2011   EF 55-60%, normal   UPPER GASTROINTESTINAL ENDOSCOPY     WISDOM TOOTH EXTRACTION      Current Medications: Outpatient Medications Prior to Visit  Medication Sig Dispense Refill   fenofibrate (TRICOR) 48 MG tablet Take 1 tablet (48 mg total) by mouth daily. 90 tablet 3   rosuvastatin (CRESTOR) 10 MG tablet Take 1 tablet (10 mg total) by mouth daily. 90 tablet 3   acetaminophen (TYLENOL) 325 MG tablet Take 2 tablets (650 mg total) by mouth every 6 (six) hours as needed for mild pain, fever or headache (or Fever >/= 101).     aspirin EC 81 MG tablet Take 81 mg by mouth daily.     Multiple Vitamin (MULTIVITAMIN) tablet Take 1 tablet by mouth daily.     NON FORMULARY Take 1-3 capsules by mouth 2 (two) times daily. (Patient not taking: Reported on 07/07/2022)     Pseudoephedrine HCl (SUDAFED 12 HOUR PO) Take 1 tablet by mouth daily as needed (Sinus).     meloxicam (MOBIC) 7.5 MG tablet Take 1 tablet (7.5 mg total) by mouth daily. 30 tablet 5   omeprazole (PRILOSEC) 20 MG capsule TAKE 1 CAPSULE BY MOUTH EVERY DAY 90 capsule 3   No facility-administered medications prior to visit.     Allergies:   Lidocaine, Other, Amlodipine besylate, Levofloxacin, and Sulfonamide derivatives   Social History   Socioeconomic History   Marital status: Married    Spouse name: Not on file   Number of children: 2   Years of education: Not on file   Highest education level: Not on file  Occupational History   Occupation: Education officer, museum, Estate agent, 3rd shift    Employer: [employer]  Tobacco Use   Smoking status: Former    Types: Cigarettes    Quit date: 11/27/1973    Years since quitting: 48.7   Smokeless tobacco: Never  Vaping Use   Vaping Use: Never used  Substance and Sexual Activity   Alcohol use: No   Drug use: No   Sexual activity: Yes  Other Topics Concern   Not on file   Social History Narrative   Lives with wife, who is a Engineer, civil (consulting).   Social Determinants of Health   Financial Resource Strain: Not on file  Food Insecurity: No Food Insecurity (11/05/2021)   Hunger Vital Sign    Worried About Running Out of Food in the Last Year: Never true    Ran Out of Food in the Last Year: Never true  Transportation Needs: No Transportation Needs (11/05/2021)   PRAPARE - Administrator, Civil Service (Medical): No    Lack of Transportation (Non-Medical): No  Physical Activity: Not on file  Stress: Not on file  Social Connections: Not on file     Family History:  The patient's family history includes COPD in his sister; Heart attack in his  father and sister; Heart failure in his father; Hyperlipidemia in his brother and sister; Hypertension in his brother; Lymphoma in his brother; Ovarian cancer in his mother; Throat cancer (age of onset: 72) in his maternal uncle.   ROS:   Please see the history of present illness.   All other systems are reviewed and are negative.   PHYSICAL EXAM:   VS:  BP 136/88 (BP Location: Right Arm, Patient Position: Sitting)   Pulse 74   Resp 18   Ht 6\' 2"  (1.88 m)   Wt 242 lb 9.6 oz (110 kg)   SpO2 96%   BMI 31.15 kg/m      General: Alert, oriented x3, no distress, muscular and fit, but also mildly obese.  Healthy left subclavian pacemaker site. Head: no evidence of trauma, PERRL, EOMI, no exophtalmos or lid lag, no myxedema, no xanthelasma; normal ears, nose and oropharynx Neck: normal jugular venous pulsations and no hepatojugular reflux; brisk carotid pulses without delay and possible faint bilateral carotid bruits Chest: clear to auscultation, no signs of consolidation by percussion or palpation, normal fremitus, symmetrical and full respiratory excursions Cardiovascular: normal position and quality of the apical impulse, regular rhythm, normal first and second heart sounds, no murmurs, rubs or gallops Abdomen: no  tenderness or distention, no masses by palpation, no abnormal pulsatility or arterial bruits, normal bowel sounds, no hepatosplenomegaly Extremities: no clubbing, cyanosis or edema; 2+ radial, ulnar and brachial pulses bilaterally; 2+ right femoral, posterior tibial and dorsalis pedis pulses; 2+ left femoral, posterior tibial and dorsalis pedis pulses; no subclavian or femoral bruits Neurological: grossly nonfocal Psych: Normal mood and affect    Wt Readings from Last 3 Encounters:  07/29/22 242 lb 9.6 oz (110 kg)  07/07/22 243 lb (110.2 kg)  06/09/22 244 lb 12.8 oz (111 kg)      Studies/Labs Reviewed:   EKG:  EKG is ordered today.  Shows normal sinus rhythm, without ischemic repolarization abnormalities.  Recent Labs: 11/05/2021: ALT 41 11/06/2021: BUN 24; Creatinine, Ser 1.26; Hemoglobin 12.8; Platelets 106; Potassium 3.8; Sodium 135   Lipid Panel    Component Value Date/Time   CHOL 163 05/30/2022 0817   TRIG 121 05/30/2022 0817   HDL 30 (L) 05/30/2022 0817   CHOLHDL 5.4 (H) 05/30/2022 0817   CHOLHDL 5.3 (H) 05/31/2018 0944   VLDL 30 12/28/2015 0827   LDLCALC 111 (H) 05/30/2022 0817   LDLCALC 101 (H) 05/31/2018 0944      ASSESSMENT:    1. Coronary artery disease of native artery of native heart with stable angina pectoris (HCC)   2. Mixed hyperlipidemia   3. Neurocardiogenic syncope   4. Pacemaker        PLAN:  In order of problems listed above:  Exertional angina: Coupled with his very high calcium score, this is concerning for development of significant coronary stenoses.  Calcium score is very high and his pacemaker leads cause some artifact that may interfere with CT angiography as well.  I think he will be best served by repeat invasive coronary angiography (this was last performed 16 years ago). This procedure has been fully reviewed with the patient and informed consent has been obtained.  Avoid NSAIDs 24 hours before the catheterization procedure.  Continue  aspirin. 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  He is obese and probably has metabolic syndrome but has not developed diabetes mellitus, probably because he is very physically active.  Triglycerides are normal on  fenofibrate.  LDL is higher than desirable at 111.  With the new information about his elevated coronary calcium score he should start statin therapy to target LDL less than 70, regardless of whether or not we find obstructive CAD on catheterization.  Will start rosuvastatin and reduce the low-dose fenofibrate 48 mg daily, at least for the time being.  Recheck labs in 3 months. Obesity: Strongly encourage weight loss by reducing intake of saturated fat and sugars and starches with high glycemic index. Neurocardiogenic syncope: He has not had full syncope ever since his pacemaker was initially implanted 12 years ago.  Occasional spells of dizziness resolved with hydration. PPM: Normal device function.  Continue remote downloads every 3 months. HTN: Blood pressure is borderline high, likely to be in fully normal range if he loses some weight.  Informed Consent   Shared Decision Making/Informed Consent The risks [stroke (1 in 1000), death (1 in 1000), kidney failure [usually temporary] (1 in 500), bleeding (1 in 200), allergic reaction [possibly serious] (1 in 200)], benefits (diagnostic support and management of coronary artery disease) and alternatives of a cardiac catheterization were discussed in detail with [name] and he is willing to proceed.      Medication Adjustments/Labs and Tests Ordered: Current medicines are reviewed at length with the patient today.  Concerns regarding medicines are outlined above.  Medication changes, Labs and Tests ordered today are listed in the Patient Instructions below. Patient Instructions  Medication Instructions:  No changes *If you need a refill on your cardiac medications before your next appointment, please call your  pharmacy*   Lab Work: CBC, BMET- today If you have labs (blood work) drawn today and your tests are completely normal, you will receive your results only by: MyChart Message (if you have MyChart) OR A paper copy in the mail If you have any lab test that is abnormal or we need to change your treatment, we will call you to review the results.   Testing/Procedures:  [place] [facility] [address] [place] [id number] [place] [address] Dept: [phone] Loc: [phone]  WITT PLITT  07/29/2022  You are scheduled for a Cardiac Catheterization on Tuesday, June 25 with Dr. [name].  1. Please arrive at the [facility] (Main Entrance A) at [facility]: [address] [place], [place] [address] at 8:30 AM (This time is  hour(s) before your procedure to ensure your preparation). Free valet parking service is available. You will check in at ADMITTING. The support person will be asked to wait in the waiting room.  It is OK to have someone drop you off and come back when you are ready to be discharged.    Special note: Every effort is made to have your procedure done on time. Please understand that emergencies sometimes delay scheduled procedures.  2. Diet: Do not eat solid foods after midnight.  The patient may have clear liquids until 5am upon the day of the procedure.  3. Labs: You will need to have blood drawn today  4. Medication instructions in preparation for your procedure:   Contrast Allergy: No  On the morning of your procedure, take your Aspirin 81 mg and any morning medicines NOT listed above.  You may use sips of water.  5. Plan to go home the same day, you will only stay overnight if medically necessary. 6. Bring a current list of your medications  and current insurance cards. 7. You MUST have a responsible person to drive you home. 8. Someone MUST be with  you the first 24 hours after you arrive home or your discharge will be delayed. 9. Please wear clothes that are easy to get on and off and wear slip-on shoes.  Thank you for allowing [place] to care for you!   -- [place] [facility]    Follow-Up: At [facility], you and your health needs are our priority.  As part of our continuing mission to provide you with exceptional heart care, we have created designated Provider Care Teams.  These Care Teams include your primary Cardiologist (physician) and Advanced Practice Providers (APPs -  Physician Assistants and Nurse Practitioners) who all work together to provide you with the care you need, when you need it.  We recommend signing up for the patient portal called "MyChart".  Sign up information is provided on this After Visit Summary.  MyChart is used to connect with patients for Virtual Visits (Telemedicine).  Patients are able to view lab/test results, encounter notes, upcoming appointments, etc.  Non-urgent messages can be sent to your provider as well.   To learn more about what you can do with MyChart, go to [email or web address].    Your next appointment:    Will schedule appt after Cath  Provider:   Thurmon Fair, MD       Signed, Thurmon Fair, MD  07/29/2022 8:27 AM    [facility] [address] [place], [place], [place]  [address] Phone: [phone]; Fax: [phone]

## 2022-07-29 NOTE — Patient Instructions (Signed)
Medication Instructions:  No changes *If you need a refill on your cardiac medications before your next appointment, please call your pharmacy*   Lab Work: CBC, BMET- today If you have labs (blood work) drawn today and your tests are completely normal, you will receive your results only by: MyChart Message (if you have MyChart) OR A paper copy in the mail If you have any lab test that is abnormal or we need to change your treatment, we will call you to review the results.   Testing/Procedures:  [place] [facility] DEPT OF [facility] AT [facility] [address] [place] [id number] [place] [address] Dept: [phone] Loc: [phone]  PAULO KEIMIG  07/29/2022  You are scheduled for a Cardiac Catheterization on Tuesday, June 25 with Dr. [name].  1. Please arrive at the [facility] (Main Entrance A) at [facility]: [address] [place], [place] [address] at 8:30 AM (This time is  hour(s) before your procedure to ensure your preparation). Free valet parking service is available. You will check in at ADMITTING. The support person will be asked to wait in the waiting room.  It is OK to have someone drop you off and come back when you are ready to be discharged.    Special note: Every effort is made to have your procedure done on time. Please understand that emergencies sometimes delay scheduled procedures.  2. Diet: Do not eat solid foods after midnight.  The patient may have clear liquids until 5am upon the day of the procedure.  3. Labs: You will need to have blood drawn today  4. Medication instructions in preparation for your procedure:   Contrast Allergy: No  On the morning of your procedure, take your Aspirin 81 mg and any morning medicines NOT listed above.  You may use sips of water.  5. Plan to go home the same day, you will only stay overnight if medically necessary. 6. Bring a current  list of your medications and current insurance cards. 7. You MUST have a responsible person to drive you home. 8. Someone MUST be with you the first 24 hours after you arrive home or your discharge will be delayed. 9. Please wear clothes that are easy to get on and off and wear slip-on shoes.  Thank you for allowing [place] to care for you!   -- [place] [facility]    Follow-Up: At [facility], you and your health needs are our priority.  As part of our continuing mission to provide you with exceptional heart care, we have created designated Provider Care Teams.  These Care Teams include your primary Cardiologist (physician) and Advanced Practice Providers (APPs -  Physician Assistants and Nurse Practitioners) who all work together to provide you with the care you need, when you need it.  We recommend signing up for the patient portal called "MyChart".  Sign up information is provided on this After Visit Summary.  MyChart is used to connect with patients for Virtual Visits (Telemedicine).  Patients are able to view lab/test results, encounter notes, upcoming appointments, etc.  Non-urgent messages can be sent to your provider as well.   To learn more about what you can do with MyChart, go to [email or web address].    Your next appointment:    Will schedule appt after Cath  Provider:   Thurmon Fair, MD

## 2022-07-30 LAB — BASIC METABOLIC PANEL
BUN/Creatinine Ratio: 16 (ref 10–24)
BUN: 22 mg/dL (ref 8–27)
CO2: 24 mmol/L (ref 20–29)
Calcium: 9.7 mg/dL (ref 8.6–10.2)
Chloride: 107 mmol/L — ABNORMAL HIGH (ref 96–106)
Creatinine, Ser: 1.34 mg/dL — ABNORMAL HIGH (ref 0.76–1.27)
Glucose: 96 mg/dL (ref 70–99)
Sodium: 143 mmol/L (ref 134–144)
eGFR: 59 mL/min/{1.73_m2} — ABNORMAL LOW (ref >59)

## 2022-07-30 LAB — CBC
MCH: 33 pg (ref 26.6–33.0)
MCV: 97 fL (ref 79–97)
WBC: 7.2 10*3/uL (ref 3.4–10.8)

## 2022-08-02 ENCOUNTER — Encounter (HOSPITAL_COMMUNITY)
Admission: RE | Disposition: A | Discharge status: Home / Self Care | Payer: Self-pay | Source: Home / Self Care | Attending: Cardiovascular Disease

## 2022-08-02 ENCOUNTER — Ambulatory Visit (HOSPITAL_COMMUNITY)
Admission: RE | Admit: 2022-08-02 | Discharge: 2022-08-02 | Disposition: A | Discharge status: Home / Self Care | Payer: BC Managed Care – PPO | Attending: Cardiovascular Disease | Admitting: Cardiovascular Disease

## 2022-08-02 ENCOUNTER — Other Ambulatory Visit: Payer: Self-pay

## 2022-08-02 DIAGNOSIS — I2510 Atherosclerotic heart disease of native coronary artery without angina pectoris: Secondary | ICD-10-CM

## 2022-08-02 DIAGNOSIS — R55 Syncope and collapse: Secondary | ICD-10-CM | POA: Insufficient documentation

## 2022-08-02 DIAGNOSIS — Z79899 Other long term (current) drug therapy: Secondary | ICD-10-CM | POA: Insufficient documentation

## 2022-08-02 DIAGNOSIS — Z7982 Long term (current) use of aspirin: Secondary | ICD-10-CM | POA: Insufficient documentation

## 2022-08-02 DIAGNOSIS — I25118 Atherosclerotic heart disease of native coronary artery with other forms of angina pectoris: Secondary | ICD-10-CM | POA: Diagnosis not present

## 2022-08-02 DIAGNOSIS — I10 Essential (primary) hypertension: Secondary | ICD-10-CM | POA: Insufficient documentation

## 2022-08-02 DIAGNOSIS — E669 Obesity, unspecified: Secondary | ICD-10-CM | POA: Insufficient documentation

## 2022-08-02 DIAGNOSIS — R931 Abnormal findings on diagnostic imaging of heart and coronary circulation: Secondary | ICD-10-CM | POA: Diagnosis not present

## 2022-08-02 DIAGNOSIS — E782 Mixed hyperlipidemia: Secondary | ICD-10-CM | POA: Insufficient documentation

## 2022-08-02 DIAGNOSIS — Z6832 Body mass index (BMI) 32.0-32.9, adult: Secondary | ICD-10-CM | POA: Diagnosis not present

## 2022-08-02 DIAGNOSIS — Z950 Presence of cardiac pacemaker: Secondary | ICD-10-CM | POA: Insufficient documentation

## 2022-08-02 HISTORY — PX: LEFT HEART CATH AND CORONARY ANGIOGRAPHY: CATH118249

## 2022-08-02 SURGERY — LEFT HEART CATH AND CORONARY ANGIOGRAPHY
Anesthesia: LOCAL

## 2022-08-02 MED ORDER — SODIUM CHLORIDE 0.9% FLUSH: 3.0000 mL | INTRAVENOUS | Status: DC | PRN

## 2022-08-02 MED ORDER — FENTANYL CITRATE (PF) 100 MCG/2ML IJ SOLN
INTRAMUSCULAR | Status: AC
  Filled 2022-08-02: qty 2

## 2022-08-02 MED ORDER — SODIUM CHLORIDE 0.9% FLUSH: 3.0000 mL | Freq: Two times a day (BID) | INTRAVENOUS | Status: DC

## 2022-08-02 MED ORDER — LIDOCAINE HCL (PF) 1 % IJ SOLN
INTRAMUSCULAR | Status: AC
  Filled 2022-08-02: qty 30

## 2022-08-02 MED ORDER — LIDOCAINE HCL (PF) 1 % IJ SOLN
INTRAMUSCULAR | Status: DC | PRN
  Administered 2022-08-02: 5 mL via INTRADERMAL

## 2022-08-02 MED ORDER — FENTANYL CITRATE (PF) 100 MCG/2ML IJ SOLN
INTRAMUSCULAR | Status: DC | PRN
  Administered 2022-08-02: 25 ug via INTRAVENOUS

## 2022-08-02 MED ORDER — ASPIRIN 81 MG PO CHEW
CHEWABLE_TABLET | ORAL | Status: AC
  Administered 2022-08-02: 81 mg via ORAL
  Filled 2022-08-02: qty 1

## 2022-08-02 MED ORDER — VERAPAMIL HCL 2.5 MG/ML IV SOLN
INTRAVENOUS | Status: DC | PRN
  Administered 2022-08-02: 10 mL via INTRA_ARTERIAL

## 2022-08-02 MED ORDER — MIDAZOLAM HCL 2 MG/2ML IJ SOLN
INTRAMUSCULAR | Status: AC
  Filled 2022-08-02: qty 2

## 2022-08-02 MED ORDER — HEPARIN (PORCINE) IN NACL 1000-0.9 UT/500ML-% IV SOLN
INTRAVENOUS | Status: DC | PRN
  Administered 2022-08-02 (×2): 500 mL

## 2022-08-02 MED ORDER — HEPARIN SODIUM (PORCINE) 1000 UNIT/ML IJ SOLN
INTRAMUSCULAR | Status: DC | PRN
  Administered 2022-08-02: 5300 [IU] via INTRAVENOUS

## 2022-08-02 MED ORDER — SODIUM CHLORIDE 0.9 % WEIGHT BASED INFUSION
3.0000 mL/kg/h | INTRAVENOUS | Status: AC
  Administered 2022-08-02: 3 mL/kg/h via INTRAVENOUS

## 2022-08-02 MED ORDER — SODIUM CHLORIDE 0.9 % WEIGHT BASED INFUSION: 1.0000 mL/kg/h | INTRAVENOUS | Status: DC

## 2022-08-02 MED ORDER — SODIUM CHLORIDE 0.9 % IV SOLN: 250.0000 mL | INTRAVENOUS | Status: DC | PRN

## 2022-08-02 MED ORDER — VERAPAMIL HCL 2.5 MG/ML IV SOLN
INTRAVENOUS | Status: AC
  Filled 2022-08-02: qty 2

## 2022-08-02 MED ORDER — IOHEXOL 350 MG/ML SOLN
INTRAVENOUS | Status: DC | PRN
  Administered 2022-08-02: 75 mL

## 2022-08-02 MED ORDER — MIDAZOLAM HCL 2 MG/2ML IJ SOLN
INTRAMUSCULAR | Status: DC | PRN
  Administered 2022-08-02: 2 mg via INTRAVENOUS

## 2022-08-02 MED ORDER — ASPIRIN 81 MG PO CHEW: 81.0000 mg | CHEWABLE_TABLET | ORAL | Status: AC

## 2022-08-02 MED ORDER — HEPARIN SODIUM (PORCINE) 1000 UNIT/ML IJ SOLN
INTRAMUSCULAR | Status: AC
  Filled 2022-08-02: qty 10

## 2022-08-02 SURGICAL SUPPLY — 11 items
CATH INFINITI AMBI 5FR TG (CATHETERS) IMPLANT
CATH INFINITI JR4 5F (CATHETERS) IMPLANT
DEVICE RAD COMP TR BAND LRG (VASCULAR PRODUCTS) IMPLANT
GLIDESHEATH SLEND SS 6F .021 (SHEATH) IMPLANT
GUIDEWIRE INQWIRE 1.5J.035X260 (WIRE) IMPLANT
INQWIRE 1.5J .035X260CM (WIRE) ×1
KIT HEART LEFT (KITS) ×1 IMPLANT
PACK CARDIAC CATHETERIZATION (CUSTOM PROCEDURE TRAY) ×1 IMPLANT
SHEATH PROBE COVER 6X72 (BAG) IMPLANT
TRANSDUCER W/STOPCOCK (MISCELLANEOUS) ×1 IMPLANT
TUBING CIL FLEX 10 FLL-RA (TUBING) ×1 IMPLANT

## 2022-08-02 NOTE — Interval H&P Note (Signed)
Cath Lab Visit (complete for each Cath Lab visit)  Clinical Evaluation Leading to the Procedure:   ACS: No.  Non-ACS:    Anginal Classification: CCS II  Anti-ischemic medical therapy: No Therapy  Non-Invasive Test Results: No non-invasive testing performed  Prior CABG: No previous CABG      History and Physical Interval Note:  08/02/2022 10:55 AM  [name]  has presented today for surgery, with the diagnosis of angina.  The various methods of treatment have been discussed with the patient and family. After consideration of risks, benefits and other options for treatment, the patient has consented to  Procedure(s): LEFT HEART CATH AND CORONARY ANGIOGRAPHY (N/A) as a surgical intervention.  The patient's history has been reviewed, patient examined, no change in status, stable for surgery.  I have reviewed the patient's chart and labs.  Questions were answered to the patient's satisfaction.     [name]

## 2022-08-02 NOTE — Discharge Instructions (Signed)

## 2022-08-03 ENCOUNTER — Encounter (HOSPITAL_COMMUNITY): Payer: Self-pay | Admitting: Cardiovascular Disease

## 2022-08-05 ENCOUNTER — Other Ambulatory Visit: Payer: Self-pay | Admitting: Emergency Medicine

## 2022-08-05 DIAGNOSIS — E782 Mixed hyperlipidemia: Secondary | ICD-10-CM

## 2022-08-05 DIAGNOSIS — I25118 Atherosclerotic heart disease of native coronary artery with other forms of angina pectoris: Secondary | ICD-10-CM

## 2022-08-25 ENCOUNTER — Ambulatory Visit (INDEPENDENT_AMBULATORY_CARE_PROVIDER_SITE_OTHER): Payer: BC Managed Care – PPO

## 2022-08-25 DIAGNOSIS — I25118 Atherosclerotic heart disease of native coronary artery with other forms of angina pectoris: Secondary | ICD-10-CM | POA: Diagnosis not present

## 2022-08-25 LAB — CUP PACEART REMOTE DEVICE CHECK
Battery Remaining Longevity: 149 mo
Battery Voltage: 3.03 V
Brady Statistic AP VP Percent: 0.03 %
Brady Statistic AP VS Percent: 73.6 %
Brady Statistic AS VP Percent: 0.01 %
Brady Statistic AS VS Percent: 26.37 %
Brady Statistic RA Percent Paced: 73.7 %
Brady Statistic RV Percent Paced: 0.03 %
Date Time Interrogation Session: 20240717212006
Implantable Lead Connection Status: 753985
Implantable Lead Connection Status: 753985
Implantable Lead Implant Date: 20120501
Implantable Lead Implant Date: 20120501
Implantable Lead Location: 753859
Implantable Lead Location: 753860
Implantable Pulse Generator Implant Date: 20230109
Lead Channel Impedance Value: 342 Ohm
Lead Channel Impedance Value: 380 Ohm
Lead Channel Impedance Value: 418 Ohm
Lead Channel Impedance Value: 456 Ohm
Lead Channel Pacing Threshold Amplitude: 0.75 V
Lead Channel Pacing Threshold Amplitude: 0.875 V
Lead Channel Pacing Threshold Pulse Width: 0.4 ms
Lead Channel Pacing Threshold Pulse Width: 0.4 ms
Lead Channel Sensing Intrinsic Amplitude: 1.5 mV
Lead Channel Sensing Intrinsic Amplitude: 1.5 mV
Lead Channel Sensing Intrinsic Amplitude: 15 mV
Lead Channel Sensing Intrinsic Amplitude: 15 mV
Lead Channel Setting Pacing Amplitude: 1.5 V
Lead Channel Setting Pacing Amplitude: 2 V
Lead Channel Setting Pacing Pulse Width: 0.4 ms
Lead Channel Setting Sensing Sensitivity: 0.9 mV
Zone Setting Status: 755011
Zone Setting Status: 755011

## 2022-09-01 DIAGNOSIS — L818 Other specified disorders of pigmentation: Secondary | ICD-10-CM | POA: Diagnosis not present

## 2022-09-08 NOTE — Progress Notes (Signed)
Remote pacemaker transmission.   

## 2022-09-27 ENCOUNTER — Ambulatory Visit (HOSPITAL_COMMUNITY)
Admission: RE | Admit: 2022-09-27 | Discharge: 2022-09-27 | Disposition: A | Discharge status: Home / Self Care | Payer: BC Managed Care – PPO | Source: Ambulatory Visit | Attending: Orthopedic Surgery | Admitting: Orthopedic Surgery

## 2022-09-27 DIAGNOSIS — M7542 Impingement syndrome of left shoulder: Secondary | ICD-10-CM | POA: Diagnosis not present

## 2022-09-27 DIAGNOSIS — M25512 Pain in left shoulder: Secondary | ICD-10-CM | POA: Diagnosis not present

## 2022-09-27 DIAGNOSIS — M75102 Unspecified rotator cuff tear or rupture of left shoulder, not specified as traumatic: Secondary | ICD-10-CM | POA: Diagnosis not present

## 2022-09-27 DIAGNOSIS — S43432A Superior glenoid labrum lesion of left shoulder, initial encounter: Secondary | ICD-10-CM | POA: Diagnosis not present

## 2022-09-27 DIAGNOSIS — M25412 Effusion, left shoulder: Secondary | ICD-10-CM | POA: Diagnosis not present

## 2022-09-27 DIAGNOSIS — M7582 Other shoulder lesions, left shoulder: Secondary | ICD-10-CM | POA: Diagnosis not present

## 2022-10-04 ENCOUNTER — Telehealth: Payer: Self-pay | Admitting: Cardiovascular Disease

## 2022-10-04 NOTE — Telephone Encounter (Signed)
Caller [name] J.) stated [facility] is requesting patient lab order dated 05/30/22 will need "the provider to contact [facility] regarding the coding for the lab order".  Caller provided  [facility] contact number:  [phone] and patient's invoice# [id number].

## 2022-10-04 NOTE — Telephone Encounter (Signed)
Call to [facility] at  [facility] regarding lab not covered. Denial due to experimental or investigational. Code for the lab was E 78.5.  New DX code given for E 78.41.  Will call if any further issues. Will take 30-45 days to process and if does not cover then will notify patient and office

## 2022-10-05 ENCOUNTER — Ambulatory Visit: Payer: BC Managed Care – PPO | Admitting: Orthopedic Surgery

## 2022-10-06 ENCOUNTER — Ambulatory Visit: Payer: BC Managed Care – PPO | Admitting: Orthopedic Surgery

## 2022-10-17 ENCOUNTER — Ambulatory Visit: Payer: BC Managed Care – PPO | Admitting: Orthopedic Surgery

## 2022-10-20 ENCOUNTER — Telehealth: Payer: Self-pay | Admitting: Orthopedic Surgery

## 2022-10-20 ENCOUNTER — Ambulatory Visit: Payer: BC Managed Care – PPO | Admitting: Orthopedic Surgery

## 2022-10-20 ENCOUNTER — Ambulatory Visit (INDEPENDENT_AMBULATORY_CARE_PROVIDER_SITE_OTHER): Payer: BC Managed Care – PPO | Admitting: Orthopedic Surgery

## 2022-10-20 DIAGNOSIS — M19019 Primary osteoarthritis, unspecified shoulder: Secondary | ICD-10-CM

## 2022-10-20 DIAGNOSIS — M19012 Primary osteoarthritis, left shoulder: Secondary | ICD-10-CM

## 2022-10-20 MED ORDER — INDOMETHACIN 25 MG PO CAPS
25.0000 mg | ORAL_CAPSULE | Freq: Two times a day (BID) | ORAL | 0 refills | Status: AC
Start: 2022-10-20 — End: ?

## 2022-10-20 NOTE — Progress Notes (Signed)
  Follow-up MRI results left shoulder  67 year old male chronic left shoulder pain status post injection no improvement  History of cardiac pacemaker  History of coronary artery disease  Works as a Estate agent  Complains of night pain inability to sleep pain is located on the left shoulder deltoid area associated with weakness and fatigue  History of cervical spine fusion C5-C6 and 7 date of surgery 2012 Dr. Phoebe Perch and Venetia Maxon  MRI shows osteoarthritis of the shoulder with some labral tears but primarily full-thickness cartilage loss is noted he also has some interstitial tearing of the rotator cuff but is grossly intact  We discussed this with his wife  Options were nonoperative treatment Anti-inflammatories Activity modification Joint injection Physical therapy  Versus operative treatment  Total shoulder arthroplasty   After much debate and discussion of his cervical spine disease we decided to put him on anti-inflammatories and do the joint injection  Follow-up 2 weeks after joint injection  Meds ordered this encounter  Medications   indomethacin (INDOCIN) 25 MG capsule    Sig: Take 1 capsule (25 mg total) by mouth 2 (two) times daily with a meal.    Dispense:  60 capsule    Refill:  0

## 2022-10-20 NOTE — Telephone Encounter (Signed)
Call patient, needs [name] penn injection left shoulder intraarticular

## 2022-10-24 NOTE — Telephone Encounter (Signed)
10/26/2022 11:00 AM at [facility]   Was already scheduled, not sure how, but was advised patient aware.

## 2022-10-26 ENCOUNTER — Ambulatory Visit (HOSPITAL_COMMUNITY)
Admission: RE | Admit: 2022-10-26 | Discharge: 2022-10-26 | Disposition: A | Discharge status: Home / Self Care | Payer: BC Managed Care – PPO | Source: Ambulatory Visit | Attending: Orthopedic Surgery | Admitting: Orthopedic Surgery

## 2022-10-26 ENCOUNTER — Encounter (HOSPITAL_COMMUNITY): Payer: Self-pay

## 2022-10-26 DIAGNOSIS — G8929 Other chronic pain: Secondary | ICD-10-CM | POA: Insufficient documentation

## 2022-10-26 DIAGNOSIS — M19019 Primary osteoarthritis, unspecified shoulder: Secondary | ICD-10-CM | POA: Diagnosis not present

## 2022-10-26 DIAGNOSIS — M25512 Pain in left shoulder: Secondary | ICD-10-CM | POA: Diagnosis not present

## 2022-10-26 MED ORDER — METHYLPREDNISOLONE ACETATE 40 MG/ML IJ SUSP
INTRAMUSCULAR | Status: AC
  Filled 2022-10-26: qty 1

## 2022-10-26 MED ORDER — IOHEXOL 180 MG/ML  SOLN
10.0000 mL | Freq: Once | INTRAMUSCULAR | Status: AC | PRN
  Administered 2022-10-26: 10 mL via INTRA_ARTICULAR

## 2022-10-26 MED ORDER — LIDOCAINE HCL (PF) 1 % IJ SOLN
INTRAMUSCULAR | Status: AC
  Filled 2022-10-26: qty 5

## 2022-10-26 MED ORDER — BUPIVACAINE HCL (PF) 0.5 % IJ SOLN
INTRAMUSCULAR | Status: AC
  Filled 2022-10-26: qty 30

## 2022-10-26 MED ORDER — CHLOROPROCAINE HCL 2 % IJ SOLN
10.0000 mL | Freq: Once | INTRAMUSCULAR | Status: AC
  Administered 2022-10-26: 10 mL
  Filled 2022-10-26: qty 30

## 2022-10-26 MED ORDER — METHYLPREDNISOLONE ACETATE 40 MG/ML INJ SUSP (RADIOLOG
40.0000 mg | Freq: Once | INTRAMUSCULAR | Status: AC
  Administered 2022-10-26: 40 mg via INTRA_ARTICULAR

## 2022-10-31 DIAGNOSIS — E782 Mixed hyperlipidemia: Secondary | ICD-10-CM | POA: Diagnosis not present

## 2022-10-31 DIAGNOSIS — I25118 Atherosclerotic heart disease of native coronary artery with other forms of angina pectoris: Secondary | ICD-10-CM | POA: Diagnosis not present

## 2022-11-01 LAB — LIPID PANEL
Chol/HDL Ratio: 3.4 ratio (ref 0.0–5.0)
Cholesterol, Total: 129 mg/dL (ref 100–199)
HDL: 38 mg/dL — ABNORMAL LOW (ref >39)
LDL Chol Calc (NIH): 72 mg/dL (ref 0–99)
Triglycerides: 98 mg/dL (ref 0–149)
VLDL Cholesterol Cal: 19 mg/dL (ref 5–40)

## 2022-11-03 ENCOUNTER — Encounter: Payer: Self-pay | Admitting: Orthopedic Surgery

## 2022-11-03 ENCOUNTER — Ambulatory Visit: Payer: BC Managed Care – PPO | Admitting: Orthopedic Surgery

## 2022-11-03 VITALS — BP 175/122 | HR 77 | Ht 74.0 in | Wt 236.0 lb

## 2022-11-03 DIAGNOSIS — M25512 Pain in left shoulder: Secondary | ICD-10-CM

## 2022-11-03 DIAGNOSIS — M19012 Primary osteoarthritis, left shoulder: Secondary | ICD-10-CM | POA: Diagnosis not present

## 2022-11-03 NOTE — Progress Notes (Signed)
Follow-up visit after injection left shoulder for glenohumeral arthritis  Chief Complaint  Patient presents with   Shoulder Pain    Right injection helped some    Encounter Diagnoses  Name Primary?   Acute pain of left shoulder    Arthritis of left glenohumeral joint Yes     The injection helped Mr. Cowick where the pain is tolerable  IMPRESSION: 1. Mild tendinosis of the supraspinatus tendon with a tiny insertional interstitial tear. 2. Mild tendinosis of the subscapularis tendon with a small interstitial tear. 3. Moderate osteoarthritis of the glenohumeral joint. 4. Superior labral tear from anterior to posterior.  I discussed the situation with him.  He says the injection helped make his pain tolerable.  The problem is he has the arthritis in the joint he probably would need a total shoulder replacement.  The labral tear is most likely degenerative  After discussion he would like to think this over little bit longer however when he does have more serious thoughts about surgery he would like to go to a shoulder subspecialist.  Tried to get him to see our shoulder specialist here but he declined and wanted to see someone who only did shoulders.  I gave him the names of Dr. August Saucer, Dr. Rennis Chris, Dr.CHANDLER

## 2022-11-14 ENCOUNTER — Telehealth: Payer: Self-pay | Admitting: Orthopedic Surgery

## 2022-11-14 NOTE — Telephone Encounter (Signed)
Dr. Mort Sawyers pt - spoke w/the patient's spouse, she stated that the patient is going to be going to see Dr. Francena Hanly.  He needs his x-rays and MRI on a CD.  They will need notes too.  The patient will come by and sign a release form.  Once I have that I will fax to Tammy in [place] to send records.  He will probably come by tomorrow or Wednesday to get the CD and sign the release form.

## 2022-11-24 ENCOUNTER — Ambulatory Visit (INDEPENDENT_AMBULATORY_CARE_PROVIDER_SITE_OTHER): Payer: BC Managed Care – PPO

## 2022-11-24 DIAGNOSIS — R001 Bradycardia, unspecified: Secondary | ICD-10-CM

## 2022-11-24 LAB — CUP PACEART REMOTE DEVICE CHECK
Battery Remaining Longevity: 147 mo
Battery Voltage: 3.03 V
Brady Statistic AP VP Percent: 0.03 %
Brady Statistic AP VS Percent: 70.6 %
Brady Statistic AS VP Percent: 0 %
Brady Statistic AS VS Percent: 29.36 %
Brady Statistic RA Percent Paced: 70.71 %
Brady Statistic RV Percent Paced: 0.04 %
Date Time Interrogation Session: 20241017132406
Implantable Lead Connection Status: 753985
Implantable Lead Connection Status: 753985
Implantable Lead Implant Date: 20120501
Implantable Lead Implant Date: 20120501
Implantable Lead Location: 753859
Implantable Lead Location: 753860
Implantable Pulse Generator Implant Date: 20230109
Lead Channel Impedance Value: 342 Ohm
Lead Channel Impedance Value: 399 Ohm
Lead Channel Impedance Value: 399 Ohm
Lead Channel Impedance Value: 437 Ohm
Lead Channel Pacing Threshold Amplitude: 0.75 V
Lead Channel Pacing Threshold Amplitude: 0.875 V
Lead Channel Pacing Threshold Pulse Width: 0.4 ms
Lead Channel Pacing Threshold Pulse Width: 0.4 ms
Lead Channel Sensing Intrinsic Amplitude: 1.375 mV
Lead Channel Sensing Intrinsic Amplitude: 1.375 mV
Lead Channel Sensing Intrinsic Amplitude: 14.25 mV
Lead Channel Sensing Intrinsic Amplitude: 14.25 mV
Lead Channel Setting Pacing Amplitude: 1.5 V
Lead Channel Setting Pacing Amplitude: 2 V
Lead Channel Setting Pacing Pulse Width: 0.4 ms
Lead Channel Setting Sensing Sensitivity: 0.9 mV
Zone Setting Status: 755011
Zone Setting Status: 755011

## 2022-11-28 DIAGNOSIS — M7542 Impingement syndrome of left shoulder: Secondary | ICD-10-CM | POA: Diagnosis not present

## 2022-11-29 ENCOUNTER — Telehealth: Payer: Self-pay | Admitting: *Deleted

## 2022-11-29 NOTE — Telephone Encounter (Signed)
   Name: [name]  DOB: 01-27-1956  MRN: [id number]  Primary Cardiologist: Thurmon Fair, MD   Preoperative team, please contact this patient and set up a phone call appointment for further preoperative risk assessment. Please obtain consent and complete medication review. Thank you for your help.  I confirm that guidance regarding antiplatelet and oral anticoagulation therapy has been completed and, if necessary, noted below.  Patient's aspirin is not prescribed by cardiology.  Recommendations for holding aspirin will need to come from prescribing provider.  I also confirmed the patient resides in the state of [place]. As per [facility] Medical Board telemedicine laws, the patient must reside in the state in which the provider is licensed.   Ronney Asters, NP 11/29/2022, 11:36 AM [facility]

## 2022-11-29 NOTE — Telephone Encounter (Signed)
   Pre-operative Risk Assessment    Patient Name: [name]  DOB: Aug 23, 1955 MRN: [id number]    DATE OF LAST VISIT: 07/29/22 DR. CROITORU DATE OF NEXT VISIT: NONE  Request for Surgical Clearance    Procedure:   LEFT SHOULDER ARTHROSCOPY  Date of Surgery:  Clearance TBD                                 Surgeon:  DR. Caryn Bee SUPPLE Surgeon's Group or Practice Name:  [facility] Phone number:  [phone] ATTN: [name] Fax number:  [phone]   Type of Clearance Requested:   - Medical ; ASA    Type of Anesthesia:  General    Additional requests/questions:    [name]   11/29/2022, 11:14 AM

## 2022-11-30 ENCOUNTER — Telehealth: Payer: Self-pay | Admitting: *Deleted

## 2022-11-30 NOTE — Telephone Encounter (Signed)
S/w the pt and his wife. Pt has been scheduled for tele pre op appt 12/14/22. Med rec and consent are done.

## 2022-11-30 NOTE — Telephone Encounter (Signed)
S/w the pt and his wife. Pt has been scheduled for tele pre op appt 12/14/22. Med rec and consent are done.     Patient Consent for Virtual Visit        [name] has provided verbal consent on 11/30/2022 for a virtual visit (video or telephone).   CONSENT FOR VIRTUAL VISIT FOR:  [name]  By participating in this virtual visit I agree to the following:  I hereby voluntarily request, consent and authorize [facility] and its employed or contracted physicians, physician assistants, nurse practitioners or other licensed health care professionals (the Practitioner), to provide me with telemedicine health care services (the "Services") as deemed necessary by the treating Practitioner. I acknowledge and consent to receive the Services by the Practitioner via telemedicine. I understand that the telemedicine visit will involve communicating with the Practitioner through live audiovisual communication technology and the disclosure of certain medical information by electronic transmission. I acknowledge that I have been given the opportunity to request an in-person assessment or other available alternative prior to the telemedicine visit and am voluntarily participating in the telemedicine visit.  I understand that I have the right to withhold or withdraw my consent to the use of telemedicine in the course of my care at any time, without affecting my right to future care or treatment, and that the Practitioner or I may terminate the telemedicine visit at any time. I understand that I have the right to inspect all information obtained and/or recorded in the course of the telemedicine visit and may receive copies of available information for a reasonable fee.  I understand that some of the potential risks of receiving the Services via telemedicine include:  Delay or interruption in medical evaluation due to technological equipment failure or disruption; Information transmitted may not be  sufficient (e.g. poor resolution of images) to allow for appropriate medical decision making by the Practitioner; and/or  In rare instances, security protocols could fail, causing a breach of personal health information.  Furthermore, I acknowledge that it is my responsibility to provide information about my medical history, conditions and care that is complete and accurate to the best of my ability. I acknowledge that Practitioner's advice, recommendations, and/or decision may be based on factors not within their control, such as incomplete or inaccurate data provided by me or distortions of diagnostic images or specimens that may result from electronic transmissions. I understand that the practice of medicine is not an exact science and that Practitioner makes no warranties or guarantees regarding treatment outcomes. I acknowledge that a copy of this consent can be made available to me via my patient portal [facility] MyChart), or I can request a printed copy by calling the office of [facility].    I understand that my insurance will be billed for this visit.   I have read or had this consent read to me. I understand the contents of this consent, which adequately explains the benefits and risks of the Services being provided via telemedicine.  I have been provided ample opportunity to ask questions regarding this consent and the Services and have had my questions answered to my satisfaction. I give my informed consent for the services to be provided through the use of telemedicine in my medical care

## 2022-12-14 ENCOUNTER — Ambulatory Visit: Payer: BC Managed Care – PPO | Attending: Cardiology

## 2022-12-14 DIAGNOSIS — Z01810 Encounter for preprocedural cardiovascular examination: Secondary | ICD-10-CM

## 2022-12-14 NOTE — Progress Notes (Signed)
Remote pacemaker transmission.   

## 2022-12-14 NOTE — Progress Notes (Signed)
Virtual Visit via Telephone Note   Because of [name]'s co-morbid illnesses, he is at least at moderate risk for complications without adequate follow up.  This format is felt to be most appropriate for this patient at this time.  The patient did not have access to video technology/had technical difficulties with video requiring transitioning to audio format only (telephone).  All issues noted in this document were discussed and addressed.  No physical exam could be performed with this format.  Please refer to the patient's chart for his consent to telehealth for [facility].  Evaluation Performed:  Preoperative cardiovascular risk assessment _____________   Date:  12/14/2022   Patient ID:  [name], DOB [birth year], MRN [id number] Patient Location:  Home Provider location:   Office  Primary Care Provider:  Babs Sciara, MD Primary Cardiologist:  Thurmon Fair, MD  Chief Complaint / Patient Profile   67 y.o. y/o male with a h/o PPM s/p neurocardiogenic syncope, HLD, GERD, hypotension who is pending left shoulder arthroscopy and presents today for telephonic preoperative cardiovascular risk assessment.  History of Present Illness    [name] is a 67 y.o. male who presents via audio/video conferencing for a telehealth visit today.  Pt was last seen in cardiology clinic on 07/29/2022 by Dr. Royann Shivers.  At that time [name] was having complaints of exertional chest pain.  He underwent LHC due to elevated calcium score and family history that showed mild extraluminal coronary calcification with recommendation of medical therapy. The patient is now pending procedure as outlined above. Since his last visit, he reports that he has no complaints of chest discomfort since his left heart cath and follow-up.  He currently works part-time at [employer] and stays quite active.  His blood pressure is checked by his wife at home who is a Charity fundraiser and he reports that it  runs in the 130s over 80s.  He has a extremely high BP documented in his chart that was checked by wrist cuff.  He denies chest pain, shortness of breath, lower extremity edema, fatigue, palpitations, melena, hematuria, hemoptysis, diaphoresis, weakness, presyncope, syncope, orthopnea, and PND.    Past Medical History    Past Medical History:  Diagnosis Date   Allergy    seasonal allergies   Arthritis    LEFT shoulder, RIGHT elbow   Barrett's esophagus    GERD (gastroesophageal reflux disease)    on meds   Hypercholesterolemia    triglycerides elevated-on meds   Hypotension    Pacemaker    Syncope    Past Surgical History:  Procedure Laterality Date   birthmark removal     right thigh   BLADDER SURGERY     stretched   CARDIAC CATHETERIZATION  09/15/2006   Recommendation - empiric anti-reflux therapy   CARDIOVASCULAR STRESS TEST  04/08/2011   No scintigraphic evidence of inducible myocardial ischemia. No lexiscan EKG changes. Non-diagnostic for ischemia.   CERVICAL SPINE SURGERY     plates and screws   COLONOSCOPY  2011   DJ-F/V-moviprep(exc)TICS/HPP   LEFT HEART CATH AND CORONARY ANGIOGRAPHY N/A 08/02/2022   Procedure: LEFT HEART CATH AND CORONARY ANGIOGRAPHY;  Surgeon: Lennette Bihari, MD;  Location: [facility] INVASIVE CV LAB;  Service: Cardiovascular;  Laterality: N/A;   LOWER EXTREMITY ARTERIAL DOPPLER  10/02/2006   No evidence of thrombus or thrombphlebitis.   PACEMAKER INSERTION  06/08/2010   Medtronic Revo model A010322, serial [id number]   POLYPECTOMY  2011  HPP   PPM GENERATOR CHANGEOUT N/A 02/15/2021   Procedure: PPM GENERATOR CHANGEOUT;  Surgeon: Thurmon Fair, MD;  Location: [facility] INVASIVE CV LAB;  Service: Cardiovascular;  Laterality: N/A;   TONSILLECTOMY     TRANSTHORACIC ECHOCARDIOGRAM  04/06/2011   EF 55-60%, normal   UPPER GASTROINTESTINAL ENDOSCOPY     WISDOM TOOTH EXTRACTION      Allergies  Allergies  Allergen Reactions   Lidocaine Shortness Of Breath     Possibly allergic. Anaphylaxis   Other Anaphylaxis    GI Cocktail Chinese food- causes upset stomach and vomiting   Norvasc [Amlodipine Besylate] Swelling   Levaquin [Levofloxacin] Itching and Rash   Sulfonamide Derivatives Swelling    tongue swells    Home Medications    Prior to Admission medications   Medication Sig Start Date End Date Taking? Authorizing Provider  aspirin EC 81 MG tablet Take 81 mg by mouth at bedtime.    [provider]  augmented betamethasone dipropionate (DIPROLENE-AF) 0.05 % ointment Apply 1 Application topically daily.    [provider]  Cholecalciferol (VITAMIN D3) 1000 units CAPS Take 2,000 mcg by mouth.    [provider]  fenofibrate (TRICOR) 48 MG tablet Take 1 tablet (48 mg total) by mouth daily. Patient taking differently: Take 48 mg by mouth at bedtime. 07/25/22   Croitoru, Mihai, MD  indomethacin (INDOCIN) 25 MG capsule Take 1 capsule (25 mg total) by mouth 2 (two) times daily with a meal. 10/20/22   Vickki Hearing, MD  Misc Natural Products (PROSTATE SUPPORT PO) Take 2 drops by mouth in the morning. Prostadine Drops for Prostate Health    [provider]  Multiple Vitamin (MULTIVITAMIN) tablet Take 1 tablet by mouth daily after breakfast.    [provider]  omeprazole (PRILOSEC) 20 MG capsule Take 20 mg by mouth daily as needed (indigestion/heartburn.).    [provider]  Polyethyl Glycol-Propyl Glycol (LUBRICANT EYE DROPS) 0.4-0.3 % SOLN Place 1-2 drops into both eyes 3 (three) times daily as needed (dry/irritated eyes).    [provider]  rosuvastatin (CRESTOR) 10 MG tablet Take 1 tablet (10 mg total) by mouth daily. Patient taking differently: Take 10 mg by mouth at bedtime. 07/25/22 11/30/22  Croitoru, Rachelle Hora, MD    Physical Exam    Vital Signs:  [name] does not have vital signs available for review today.130/80  Given telephonic nature of communication, physical exam  is limited. AAOx3. NAD. Normal affect.  Speech and respirations are unlabored.  Accessory Clinical Findings    None  Assessment & Plan    1.  Preoperative Cardiovascular Risk Assessment: -Patient's RCRI score is 0.9%  The patient affirms he has been doing well without any new cardiac symptoms. They are able to achieve 7 METS without cardiac limitations. Therefore, based on ACC/AHA guidelines, the patient would be at acceptable risk for the planned procedure without further cardiovascular testing. The patient was advised that if he develops new symptoms prior to surgery to contact our office to arrange for a follow-up visit, and he verbalized understanding.   The patient was advised that if he develops new symptoms prior to surgery to contact our office to arrange for a follow-up visit, and he verbalized understanding.  A copy of this note will be routed to requesting surgeon.  Time:   Today, I have spent 8 minutes with the patient with telehealth technology discussing medical history, symptoms, and management plan.     Napoleon Form, Leodis Rains,  NP  12/14/2022, 7:00 AM

## 2022-12-26 ENCOUNTER — Telehealth: Payer: Self-pay | Admitting: Cardiovascular Disease

## 2022-12-26 NOTE — Telephone Encounter (Signed)
Darl Pikes would like a call back fro the Pre Op team. She sent form over for the EP doctor to fill out because surgery will be close the [facility], She state she has not got this form back and needs to speak to someone ASAP, because pt's surgery is in the morning.

## 2022-12-27 ENCOUNTER — Encounter: Payer: Self-pay | Admitting: Cardiovascular Disease

## 2022-12-27 DIAGNOSIS — M7502 Adhesive capsulitis of left shoulder: Secondary | ICD-10-CM | POA: Diagnosis not present

## 2022-12-27 DIAGNOSIS — S43432A Superior glenoid labrum lesion of left shoulder, initial encounter: Secondary | ICD-10-CM | POA: Diagnosis not present

## 2022-12-27 DIAGNOSIS — M94212 Chondromalacia, left shoulder: Secondary | ICD-10-CM | POA: Diagnosis not present

## 2022-12-27 DIAGNOSIS — E785 Hyperlipidemia, unspecified: Secondary | ICD-10-CM | POA: Diagnosis not present

## 2022-12-27 DIAGNOSIS — M7552 Bursitis of left shoulder: Secondary | ICD-10-CM | POA: Diagnosis not present

## 2022-12-27 DIAGNOSIS — M19012 Primary osteoarthritis, left shoulder: Secondary | ICD-10-CM | POA: Diagnosis not present

## 2022-12-27 DIAGNOSIS — I890 Lymphedema, not elsewhere classified: Secondary | ICD-10-CM | POA: Diagnosis not present

## 2022-12-27 DIAGNOSIS — Z6832 Body mass index (BMI) 32.0-32.9, adult: Secondary | ICD-10-CM | POA: Diagnosis not present

## 2022-12-27 DIAGNOSIS — X58XXXA Exposure to other specified factors, initial encounter: Secondary | ICD-10-CM | POA: Diagnosis not present

## 2022-12-27 DIAGNOSIS — M75112 Incomplete rotator cuff tear or rupture of left shoulder, not specified as traumatic: Secondary | ICD-10-CM | POA: Diagnosis not present

## 2022-12-27 DIAGNOSIS — M7522 Bicipital tendinitis, left shoulder: Secondary | ICD-10-CM | POA: Diagnosis not present

## 2022-12-27 DIAGNOSIS — M65912 Unspecified synovitis and tenosynovitis, left shoulder: Secondary | ICD-10-CM | POA: Diagnosis not present

## 2022-12-27 DIAGNOSIS — M24612 Ankylosis, left shoulder: Secondary | ICD-10-CM | POA: Diagnosis not present

## 2022-12-27 DIAGNOSIS — M24112 Other articular cartilage disorders, left shoulder: Secondary | ICD-10-CM | POA: Diagnosis not present

## 2022-12-27 DIAGNOSIS — M67814 Other specified disorders of tendon, left shoulder: Secondary | ICD-10-CM | POA: Diagnosis not present

## 2022-12-27 DIAGNOSIS — M7542 Impingement syndrome of left shoulder: Secondary | ICD-10-CM | POA: Diagnosis not present

## 2022-12-27 DIAGNOSIS — Y999 Unspecified external cause status: Secondary | ICD-10-CM | POA: Diagnosis not present

## 2022-12-27 DIAGNOSIS — Z4789 Encounter for other orthopedic aftercare: Secondary | ICD-10-CM | POA: Diagnosis not present

## 2022-12-27 DIAGNOSIS — G8918 Other acute postprocedural pain: Secondary | ICD-10-CM | POA: Diagnosis not present

## 2022-12-27 DIAGNOSIS — M25512 Pain in left shoulder: Secondary | ICD-10-CM | POA: Diagnosis not present

## 2022-12-27 NOTE — Progress Notes (Signed)
PERIOPERATIVE PRESCRIPTION FOR IMPLANTED CARDIAC DEVICE PROGRAMMING  Patient Information: Name:  [name]  DOB:  12/08/1955  MRN:  [id number]   Procedure:   LEFT SHOULDER ARTHROSCOPY   Date of Surgery:  Clearance TBD                                 Surgeon:  DR. Caryn Bee SUPPLE Surgeon's Group or Practice Name:  [facility] Phone number:  [phone] ATTN: [name] Fax number:  [phone]   Type of Clearance Requested:   - Medical ; ASA    Type of Anesthesia:  General   Device Information:  Clinic EP Physician:  Dr. Rachelle Hora Croitoru  Device Type:  Pacemaker Manufacturer and Phone #:  Medtronic: [id number] Pacemaker Dependent?:  No. Date of Last Device Check:  11/24/2022 Normal Device Function?:  Yes.    Electrophysiologist's Recommendations:  Have magnet available. Provide continuous ECG monitoring when magnet is used or reprogramming is to be performed.  Procedure will likely interfere with device function.  Device should be programmed:  Asynchronous pacing during procedure and returned to normal programming after procedure  Per Device Clinic Standing Orders, Wiliam Ke, RN  7:29 AM 12/27/2022

## 2022-12-27 NOTE — Telephone Encounter (Signed)
Clearance sent as requested.

## 2023-01-10 DIAGNOSIS — M25512 Pain in left shoulder: Secondary | ICD-10-CM | POA: Diagnosis not present

## 2023-01-17 DIAGNOSIS — M25512 Pain in left shoulder: Secondary | ICD-10-CM | POA: Diagnosis not present

## 2023-01-24 DIAGNOSIS — M25512 Pain in left shoulder: Secondary | ICD-10-CM | POA: Diagnosis not present

## 2023-02-07 DIAGNOSIS — M25512 Pain in left shoulder: Secondary | ICD-10-CM | POA: Diagnosis not present

## 2023-02-10 DIAGNOSIS — M25512 Pain in left shoulder: Secondary | ICD-10-CM | POA: Diagnosis not present

## 2023-02-23 ENCOUNTER — Ambulatory Visit (INDEPENDENT_AMBULATORY_CARE_PROVIDER_SITE_OTHER): Payer: BC Managed Care – PPO

## 2023-02-23 DIAGNOSIS — R001 Bradycardia, unspecified: Secondary | ICD-10-CM | POA: Diagnosis not present

## 2023-02-23 LAB — CUP PACEART REMOTE DEVICE CHECK
Battery Remaining Longevity: 143 mo
Battery Voltage: 3.02 V
Brady Statistic AP VP Percent: 0.02 %
Brady Statistic AP VS Percent: 49.17 %
Brady Statistic AS VP Percent: 0.02 %
Brady Statistic AS VS Percent: 50.79 %
Brady Statistic RA Percent Paced: 49.28 %
Brady Statistic RV Percent Paced: 0.04 %
Date Time Interrogation Session: 20250115214659
Implantable Lead Connection Status: 753985
Implantable Lead Connection Status: 753985
Implantable Lead Implant Date: 20120501
Implantable Lead Implant Date: 20120501
Implantable Lead Location: 753859
Implantable Lead Location: 753860
Implantable Pulse Generator Implant Date: 20230109
Lead Channel Impedance Value: 323 Ohm
Lead Channel Impedance Value: 380 Ohm
Lead Channel Impedance Value: 418 Ohm
Lead Channel Impedance Value: 437 Ohm
Lead Channel Pacing Threshold Amplitude: 0.625 V
Lead Channel Pacing Threshold Amplitude: 0.875 V
Lead Channel Pacing Threshold Pulse Width: 0.4 ms
Lead Channel Pacing Threshold Pulse Width: 0.4 ms
Lead Channel Sensing Intrinsic Amplitude: 1.25 mV
Lead Channel Sensing Intrinsic Amplitude: 1.25 mV
Lead Channel Sensing Intrinsic Amplitude: 14.625 mV
Lead Channel Sensing Intrinsic Amplitude: 14.625 mV
Lead Channel Setting Pacing Amplitude: 1.5 V
Lead Channel Setting Pacing Amplitude: 2 V
Lead Channel Setting Pacing Pulse Width: 0.4 ms
Lead Channel Setting Sensing Sensitivity: 0.9 mV
Zone Setting Status: 755011
Zone Setting Status: 755011

## 2023-03-15 ENCOUNTER — Telehealth: Payer: Self-pay | Admitting: Cardiovascular Disease

## 2023-03-15 DIAGNOSIS — E782 Mixed hyperlipidemia: Secondary | ICD-10-CM

## 2023-03-15 DIAGNOSIS — I25118 Atherosclerotic heart disease of native coronary artery with other forms of angina pectoris: Secondary | ICD-10-CM

## 2023-03-15 NOTE — Telephone Encounter (Signed)
New message   Patient made his annual appointment for April.  He want to know if Dr Royann Shivers want him to have labs drawn prior to his appt.  Please call or send mychart message to patient.

## 2023-03-15 NOTE — Telephone Encounter (Signed)
 Please have a lipid profile and complete metabolic panel performed just before his appointment.

## 2023-03-15 NOTE — Telephone Encounter (Signed)
 Called and spoke to patient. Verified name and DOB. Informed patient orders placed for Lipid panel and CMET. Advised patient Lipid does need to be fasting.

## 2023-04-03 NOTE — Progress Notes (Signed)
 Remote pacemaker transmission.

## 2023-05-25 ENCOUNTER — Ambulatory Visit (INDEPENDENT_AMBULATORY_CARE_PROVIDER_SITE_OTHER): Payer: BC Managed Care – PPO

## 2023-05-25 DIAGNOSIS — R001 Bradycardia, unspecified: Secondary | ICD-10-CM | POA: Diagnosis not present

## 2023-05-26 LAB — CUP PACEART REMOTE DEVICE CHECK
Battery Remaining Longevity: 141 mo
Battery Voltage: 3.02 V
Brady Statistic AP VP Percent: 0.03 %
Brady Statistic AP VS Percent: 62.69 %
Brady Statistic AS VP Percent: 0.01 %
Brady Statistic AS VS Percent: 37.28 %
Brady Statistic RA Percent Paced: 62.8 %
Brady Statistic RV Percent Paced: 0.04 %
Date Time Interrogation Session: 20250416231821
Implantable Lead Connection Status: 753985
Implantable Lead Connection Status: 753985
Implantable Lead Implant Date: 20120501
Implantable Lead Implant Date: 20120501
Implantable Lead Location: 753859
Implantable Lead Location: 753860
Implantable Pulse Generator Implant Date: 20230109
Lead Channel Impedance Value: 342 Ohm
Lead Channel Impedance Value: 380 Ohm
Lead Channel Impedance Value: 418 Ohm
Lead Channel Impedance Value: 456 Ohm
Lead Channel Pacing Threshold Amplitude: 0.75 V
Lead Channel Pacing Threshold Amplitude: 0.875 V
Lead Channel Pacing Threshold Pulse Width: 0.4 ms
Lead Channel Pacing Threshold Pulse Width: 0.4 ms
Lead Channel Sensing Intrinsic Amplitude: 1.375 mV
Lead Channel Sensing Intrinsic Amplitude: 1.375 mV
Lead Channel Sensing Intrinsic Amplitude: 13.875 mV
Lead Channel Sensing Intrinsic Amplitude: 13.875 mV
Lead Channel Setting Pacing Amplitude: 1.5 V
Lead Channel Setting Pacing Amplitude: 2 V
Lead Channel Setting Pacing Pulse Width: 0.4 ms
Lead Channel Setting Sensing Sensitivity: 0.9 mV
Zone Setting Status: 755011
Zone Setting Status: 755011

## 2023-05-27 ENCOUNTER — Encounter: Payer: Self-pay | Admitting: Cardiovascular Disease

## 2023-05-29 DIAGNOSIS — E782 Mixed hyperlipidemia: Secondary | ICD-10-CM | POA: Diagnosis not present

## 2023-05-29 DIAGNOSIS — I25118 Atherosclerotic heart disease of native coronary artery with other forms of angina pectoris: Secondary | ICD-10-CM | POA: Diagnosis not present

## 2023-05-30 LAB — LIPID PANEL
Chol/HDL Ratio: 3.2 ratio (ref 0.0–5.0)
Cholesterol, Total: 100 mg/dL (ref 100–199)
HDL: 31 mg/dL — ABNORMAL LOW (ref >39)
LDL Chol Calc (NIH): 52 mg/dL (ref 0–99)
Triglycerides: 85 mg/dL (ref 0–149)
VLDL Cholesterol Cal: 17 mg/dL (ref 5–40)

## 2023-05-30 LAB — COMPREHENSIVE METABOLIC PANEL WITH GFR
ALT: 23 IU/L (ref 0–44)
AST: 25 IU/L (ref 0–40)
Albumin: 4.4 g/dL (ref 3.9–4.9)
Alkaline Phosphatase: 62 IU/L (ref 44–121)
BUN/Creatinine Ratio: 15 (ref 10–24)
BUN: 17 mg/dL (ref 8–27)
Bilirubin Total: 0.6 mg/dL (ref 0.0–1.2)
CO2: 22 mmol/L (ref 20–29)
Calcium: 9.5 mg/dL (ref 8.6–10.2)
Chloride: 108 mmol/L — ABNORMAL HIGH (ref 96–106)
Creatinine, Ser: 1.1 mg/dL (ref 0.76–1.27)
Globulin, Total: 1.7 g/dL (ref 1.5–4.5)
Glucose: 97 mg/dL (ref 70–99)
Potassium: 4.2 mmol/L (ref 3.5–5.2)
Sodium: 145 mmol/L — ABNORMAL HIGH (ref 134–144)
Total Protein: 6.1 g/dL (ref 6.0–8.5)
eGFR: 74 mL/min/{1.73_m2} (ref >59)

## 2023-06-07 ENCOUNTER — Ambulatory Visit: Payer: BC Managed Care – PPO | Attending: Cardiovascular Disease | Admitting: Cardiovascular Disease

## 2023-06-07 VITALS — BP 108/78 | HR 61 | Ht 74.0 in | Wt 247.0 lb

## 2023-06-07 DIAGNOSIS — E669 Obesity, unspecified: Secondary | ICD-10-CM

## 2023-06-07 DIAGNOSIS — R55 Syncope and collapse: Secondary | ICD-10-CM

## 2023-06-07 DIAGNOSIS — R001 Bradycardia, unspecified: Secondary | ICD-10-CM

## 2023-06-07 DIAGNOSIS — E782 Mixed hyperlipidemia: Secondary | ICD-10-CM

## 2023-06-07 DIAGNOSIS — Z950 Presence of cardiac pacemaker: Secondary | ICD-10-CM

## 2023-06-07 DIAGNOSIS — I25118 Atherosclerotic heart disease of native coronary artery with other forms of angina pectoris: Secondary | ICD-10-CM

## 2023-06-07 NOTE — Patient Instructions (Signed)
 Medication Instructions:  No changes *If you need a refill on your cardiac medications before your next appointment, please call your pharmacy*  Follow-Up: At [facility], you and your health needs are our priority.  As part of our continuing mission to provide you with exceptional heart care, our providers are all part of one team.  This team includes your primary Cardiologist (physician) and Advanced Practice Providers or APPs (Physician Assistants and Nurse Practitioners) who all work together to provide you with the care you need, when you need it.  Your next appointment:   1 year(s)  Provider:   Luana Rumple, MD    We recommend signing up for the patient portal called "MyChart".  Sign up information is provided on this After Visit Summary.  MyChart is used to connect with patients for Virtual Visits (Telemedicine).  Patients are able to view lab/test results, encounter notes, upcoming appointments, etc.  Non-urgent messages can be sent to your provider as well.   To learn more about what you can do with MyChart, go to [email or web address].

## 2023-06-07 NOTE — Progress Notes (Signed)
 Cardiology Office Note    Date:  06/08/2023   ID:  [name], DOB 1955-11-19, MRN [id number]  PCP:  Bennet Brasil, MD  Cardiologist:   Luana Rumple, MD   No chief complaint on file.    History of Present Illness:  [name] is a 68 y.o. male history of neurocardiogenic syncope that has resolved following pacemaker implantation in 2012 (generator change out Medtronic Azure 2023, leads Medtronic [phone], MRI conditional system), although he still has occasional presyncopal symptoms likely related to vasovagal depressor mechanism, hypercholesterolemia, low HDL, obesity, nonobstructive CAD by cardiac catheterization in June 2024, returning for pacemaker check and routine follow-up.    The patient specifically denies any chest pain at rest or with exertion, dyspnea at rest or with exertion, orthopnea, paroxysmal nocturnal dyspnea, syncope, palpitations, focal neurological deficits, intermittent claudication, lower extremity edema, unexplained weight gain, cough, hemoptysis or wheezing.  He underwent a calcium  score that was quite high (over 1100, 93rd percentile).  The CT also showed mildly enlarged ascending aorta at 43 mm with aortic atherosclerosis.  He has a strong family history of early onset CAD (his sister died from a heart attack around age 30, half sister had CABG before age 59).  He underwent cardiac catheterization with coronary angiography in June 2024 which showed mild lesions (10-30% maximum stenosis) throughout the right coronary artery and proximal-mid LAD, but no serious lesions.  He has evidence of aortic atherosclerosis on previous CT imaging but no evidence of AAA.  Pacemaker interrogation today shows normal device function.  All lead parameters are stable and within desirable range.  Estimated generator longevity is 11.7 years.  Presenting rhythm was atrial paced, ventricular sensed.  He has 58% atrial pacing and never requires ventricular pacing.  The heart rate  histogram shows appropriate distribution.  He is very active (9 hours a day).  He has not had any episodes of ventricular tachycardia or atrial fibrillation.  Pacemaker function is normal.  Most recent download was 05/25/2022.  He is not device dependent.  He has approximately 17 % atrial pacing and never requires ventricular pacing.  Lead parameters are excellent.  Generator longevity is estimated at >12 years.  There have been no episodes of true mode switch or high ventricular rate.  Metabolic control is good with an LDL cholesterol 52 and normal triglycerides but he has a chronically low HDL at only 31.  He does not have diabetes mellitus and he has normal renal function and normal TSH. [name]  Past Medical History:  Diagnosis Date   Allergy    seasonal allergies   Arthritis    LEFT shoulder, RIGHT elbow   Barrett's esophagus    GERD (gastroesophageal reflux disease)    on meds   Hypercholesterolemia    triglycerides elevated-on meds   Hypotension    Pacemaker    Syncope     Past Surgical History:  Procedure Laterality Date   birthmark removal     right thigh   BLADDER SURGERY     stretched   CARDIAC CATHETERIZATION  09/15/2006   Recommendation - empiric anti-reflux therapy   CARDIOVASCULAR STRESS TEST  04/08/2011   No scintigraphic evidence of inducible myocardial ischemia. No lexiscan EKG changes. Non-diagnostic for ischemia.   CERVICAL SPINE SURGERY     plates and screws   COLONOSCOPY  2011   DJ-F/V-moviprep(exc)TICS/HPP   LEFT HEART CATH AND CORONARY ANGIOGRAPHY N/A 08/02/2022   Procedure: LEFT HEART CATH AND CORONARY ANGIOGRAPHY;  Surgeon: Loetta Ringer,  Brandy Cal, MD;  Location: [facility];  Service: Cardiovascular;  Laterality: N/A;   LOWER EXTREMITY ARTERIAL DOPPLER  10/02/2006   No evidence of thrombus or thrombphlebitis.   PACEMAKER INSERTION  06/08/2010   Medtronic Revo model #RVDR01, serial [id number]   POLYPECTOMY  2011   HPP   PPM GENERATOR CHANGEOUT N/A 02/15/2021    Procedure: PPM GENERATOR CHANGEOUT;  Surgeon: Luana Rumple, MD;  Location: [facility];  Service: Cardiovascular;  Laterality: N/A;   TONSILLECTOMY     TRANSTHORACIC ECHOCARDIOGRAM  04/06/2011   EF 55-60%, normal   UPPER GASTROINTESTINAL ENDOSCOPY     WISDOM TOOTH EXTRACTION      Current Medications: Outpatient Medications Prior to Visit  Medication Sig Dispense Refill   aspirin  EC 81 MG tablet Take 81 mg by mouth at bedtime.     Coenzyme Q10 (CO Q 10 PO) Take 200 mg by mouth daily at 6 (six) AM.     fenofibrate  (TRICOR ) 48 MG tablet Take 1 tablet (48 mg total) by mouth daily. (Patient taking differently: Take 48 mg by mouth at bedtime.) 90 tablet 3   Multiple Vitamin (MULTIVITAMIN) tablet Take 1 tablet by mouth daily after breakfast.     OVER THE COUNTER MEDICATION Take 3 tablets by mouth at bedtime. Force Factor     rosuvastatin  (CRESTOR ) 10 MG tablet Take 1 tablet (10 mg total) by mouth daily. (Patient taking differently: Take 10 mg by mouth at bedtime.) 90 tablet 3   augmented betamethasone dipropionate (DIPROLENE-AF) 0.05 % ointment Apply 1 Application topically daily. (Patient not taking: Reported on 06/07/2023)     Cholecalciferol (VITAMIN D3) 1000 units CAPS Take 2,000 mcg by mouth. (Patient not taking: Reported on 06/07/2023)     indomethacin  (INDOCIN ) 25 MG capsule Take 1 capsule (25 mg total) by mouth 2 (two) times daily with a meal. (Patient not taking: Reported on 06/07/2023) 60 capsule 0   Misc Natural Products (PROSTATE SUPPORT PO) Take 2 drops by mouth in the morning. Prostadine Drops for Prostate Health (Patient not taking: Reported on 06/07/2023)     omeprazole  (PRILOSEC) 20 MG capsule Take 20 mg by mouth daily as needed (indigestion/heartburn.). (Patient not taking: Reported on 06/07/2023)     Polyethyl Glycol-Propyl Glycol (LUBRICANT EYE DROPS) 0.4-0.3 % SOLN Place 1-2 drops into both eyes 3 (three) times daily as needed (dry/irritated eyes). (Patient not taking:  Reported on 06/07/2023)     No facility-administered medications prior to visit.     Allergies:   Lidocaine , Other, Norvasc [amlodipine besylate], Levaquin [levofloxacin], and Sulfonamide derivatives   Family History:  The patient's family history includes COPD in his sister; Heart attack in his father and sister; Heart failure in his father; Hyperlipidemia in his brother and sister; Hypertension in his brother; Lymphoma in his brother; Ovarian cancer in his mother; Throat cancer (age of onset: 40) in his maternal uncle.   ROS:   Please see the history of present illness.   All other systems are reviewed and are negative.   PHYSICAL EXAM:   VS:  BP 108/78 (BP Location: Left Arm, Patient Position: Sitting, Cuff Size: Large)   Pulse 61   Ht 6\' 2"  (1.88 m)   Wt 112 kg   SpO2 97%   BMI 31.71 kg/m       General: Alert, oriented x3, no distress, healthy left subclavian pacemaker site Head: no evidence of trauma, PERRL, EOMI, no exophtalmos or lid lag, no myxedema, no xanthelasma; normal ears, nose  and oropharynx Neck: normal jugular venous pulsations and no hepatojugular reflux; brisk carotid pulses without delay and no carotid bruits Chest: clear to auscultation, no signs of consolidation by percussion or palpation, normal fremitus, symmetrical and full respiratory excursions Cardiovascular: normal position and quality of the apical impulse, regular rhythm, normal first and second heart sounds, no murmurs, rubs or gallops Abdomen: no tenderness or distention, no masses by palpation, no abnormal pulsatility or arterial bruits, normal bowel sounds, no hepatosplenomegaly Extremities: no clubbing, cyanosis or edema; 2+ radial, ulnar and brachial pulses bilaterally; 2+ right femoral, posterior tibial and dorsalis pedis pulses; 2+ left femoral, posterior tibial and dorsalis pedis pulses; no subclavian or femoral bruits Neurological: grossly nonfocal Psych: Normal mood and affect   Wt Readings  from Last 3 Encounters:  06/07/23 112 kg  11/03/22 107 kg  08/02/22 107 kg      Studies/Labs Reviewed:   EKG:   EKG Interpretation Date/Time:  Wednesday June 07 2023 11:26:03 EDT Ventricular Rate:  61 PR Interval:  202 QRS Duration:  108 QT Interval:  412 QTC Calculation: 414 R Axis:   -16  Text Interpretation: Atrial-paced rhythm Incomplete right bundle branch block Minimal voltage criteria for LVH, may be normal variant ( R in aVL ) When compared with ECG of 29-Jul-2022 07:52, Electronic atrial pacemaker has replaced Sinus rhythm Incomplete right bundle branch block is now Present Confirmed by Annaliesa Blann [phone]) on 06/08/2023 1:56:19 PM         Recent Labs: 07/29/2022: Hemoglobin 13.8; Platelets 191 05/29/2023: ALT 23; BUN 17; Creatinine, Ser 1.10; Potassium 4.2; Sodium 145   Lipid Panel    Component Value Date/Time   CHOL 100 05/29/2023 1008   TRIG 85 05/29/2023 1008   HDL 31 (L) 05/29/2023 1008   CHOLHDL 3.2 05/29/2023 1008   CHOLHDL 5.3 (H) 05/31/2018 0944   VLDL 30 12/28/2015 0827   LDLCALC 52 05/29/2023 1008   LDLCALC 101 (H) 05/31/2018 0944      ASSESSMENT:    1. Coronary artery disease of native artery of native heart with stable angina pectoris (HCC)   2. Mixed hyperlipidemia   3. Mild obesity   4. Neurocardiogenic syncope   5. Pacemaker        PLAN:  In order of problems listed above:  Nonobstructive CAD: Currently asymptomatic.  Has a very high coronary calcium  score which makes CT angiography less reliable.  He did not have any severe lesions on coronary angiography June 2024. HLP: Chronically low HDL cholesterol, but triglycerides and LDL are well within target range on a combination of rosuvastatin  and fenofibrate .  Continue same medications.  Try to lose weight and limit intake of carbohydrates and saturated fat. Obesity: Would still encourage him to try to lose weight.  He is very physically active.  The only way to lose weight will be  calorie and especially carbohydrate restriction. Neurocardiogenic syncope: He has not had full syncope ever since his pacemaker was initially implanted, but does have occasional episodes of dizziness that probably represent vasodepressor mechanism. PPM: Right conditional device with normal function.  Continue remote downloads every 3 months.   Informed Consent   Shared Decision Making/Informed Consent The risks [stroke (1 in 1000), death (1 in 1000), kidney failure [usually temporary] (1 in 500), bleeding (1 in 200), allergic reaction [possibly serious] (1 in 200)], benefits (diagnostic support and management of coronary artery disease) and alternatives of a cardiac catheterization were discussed in detail with Mr. Haskill and he is willing to  proceed.      Medication Adjustments/Labs and Tests Ordered: Current medicines are reviewed at length with the patient today.  Concerns regarding medicines are outlined above.  Medication changes, Labs and Tests ordered today are listed in the Patient Instructions below. Patient Instructions  Medication Instructions:  No changes *If you need a refill on your cardiac medications before your next appointment, please call your pharmacy*  Follow-Up: At [facility], you and your health needs are our priority.  As part of our continuing mission to provide you with exceptional heart care, our providers are all part of one team.  This team includes your primary Cardiologist (physician) and Advanced Practice Providers or APPs (Physician Assistants and Nurse Practitioners) who all work together to provide you with the care you need, when you need it.  Your next appointment:   1 year(s)  Provider:   Luana Rumple, MD    We recommend signing up for the patient portal called "MyChart".  Sign up information is provided on this After Visit Summary.  MyChart is used to connect with patients for Virtual Visits (Telemedicine).  Patients are able to view  lab/test results, encounter notes, upcoming appointments, etc.  Non-urgent messages can be sent to your provider as well.   To learn more about what you can do with MyChart, go to [email or web address].         Signed, Luana Rumple, MD  06/08/2023 1:59 PM    [facility] [address] [place], [place], [place]  [address] Phone: [phone]; Fax: [phone]

## 2023-06-08 ENCOUNTER — Encounter: Payer: Self-pay | Admitting: Cardiovascular Disease

## 2023-07-05 NOTE — Progress Notes (Signed)
 Remote pacemaker transmission.

## 2023-07-05 NOTE — Addendum Note (Signed)
 Addended by: Lott Rouleau A on: 07/05/2023 10:49 AM   Modules accepted: Orders

## 2023-07-28 ENCOUNTER — Telehealth: Payer: Self-pay | Admitting: Cardiovascular Disease

## 2023-07-28 ENCOUNTER — Other Ambulatory Visit: Payer: Self-pay | Admitting: Cardiovascular Disease

## 2023-07-28 MED ORDER — ROSUVASTATIN CALCIUM 10 MG PO TABS: 10.0000 mg | ORAL_TABLET | Freq: Every day | ORAL | 3 refills | Status: AC

## 2023-07-28 MED ORDER — FENOFIBRATE 48 MG PO TABS: 48.0000 mg | ORAL_TABLET | Freq: Every day | ORAL | 3 refills | Status: AC

## 2023-07-28 NOTE — Telephone Encounter (Signed)
*  STAT* If patient is at the pharmacy, call can be transferred to refill team.   1. Which medications need to be refilled? (please list name of each medication and dose if known) rosuvastatin  (CRESTOR ) 10 MG tablet (Expired)  fenofibrate  (TRICOR ) 48 MG tablet    2. Would you like to learn more about the convenience, safety, & potential cost savings by using the [facility]?    3. Are you open to using the [facility] (Type [facility]. ).   4. Which pharmacy/location (including street and city if local pharmacy) is medication to be sent to? [id number] - [place], [place] - [address] AT [address]    5. Do they need a 30 day or 90 day supply? 90 day

## 2023-07-28 NOTE — Telephone Encounter (Signed)
 Pt's medications were sent to pt's pharmacy as requested. Confirmation received.

## 2023-08-24 ENCOUNTER — Ambulatory Visit: Payer: BC Managed Care – PPO

## 2023-08-24 ENCOUNTER — Ambulatory Visit: Payer: Self-pay | Admitting: Cardiovascular Disease

## 2023-08-24 DIAGNOSIS — R001 Bradycardia, unspecified: Secondary | ICD-10-CM

## 2023-08-24 DIAGNOSIS — M79641 Pain in right hand: Secondary | ICD-10-CM | POA: Diagnosis not present

## 2023-08-24 LAB — CUP PACEART REMOTE DEVICE CHECK
Battery Remaining Longevity: 137 mo
Battery Voltage: 3.02 V
Brady Statistic AP VP Percent: 0.03 %
Brady Statistic AP VS Percent: 72.05 %
Brady Statistic AS VP Percent: 0.01 %
Brady Statistic AS VS Percent: 27.91 %
Brady Statistic RA Percent Paced: 72.18 %
Brady Statistic RV Percent Paced: 0.04 %
Date Time Interrogation Session: 20250716213058
Implantable Lead Connection Status: 753985
Implantable Lead Connection Status: 753985
Implantable Lead Implant Date: 20120501
Implantable Lead Implant Date: 20120501
Implantable Lead Location: 753859
Implantable Lead Location: 753860
Implantable Pulse Generator Implant Date: 20230109
Lead Channel Impedance Value: 361 Ohm
Lead Channel Impedance Value: 380 Ohm
Lead Channel Impedance Value: 437 Ohm
Lead Channel Impedance Value: 456 Ohm
Lead Channel Pacing Threshold Amplitude: 0.5 V
Lead Channel Pacing Threshold Amplitude: 0.75 V
Lead Channel Pacing Threshold Pulse Width: 0.4 ms
Lead Channel Pacing Threshold Pulse Width: 0.4 ms
Lead Channel Sensing Intrinsic Amplitude: 1.375 mV
Lead Channel Sensing Intrinsic Amplitude: 1.375 mV
Lead Channel Sensing Intrinsic Amplitude: 16.75 mV
Lead Channel Sensing Intrinsic Amplitude: 16.75 mV
Lead Channel Setting Pacing Amplitude: 1.5 V
Lead Channel Setting Pacing Amplitude: 2 V
Lead Channel Setting Pacing Pulse Width: 0.4 ms
Lead Channel Setting Sensing Sensitivity: 0.9 mV
Zone Setting Status: 755011
Zone Setting Status: 755011

## 2023-11-14 NOTE — Progress Notes (Signed)
 Remote PPM Transmission

## 2023-11-23 ENCOUNTER — Ambulatory Visit (INDEPENDENT_AMBULATORY_CARE_PROVIDER_SITE_OTHER): Payer: BC Managed Care – PPO

## 2023-11-23 DIAGNOSIS — R001 Bradycardia, unspecified: Secondary | ICD-10-CM | POA: Diagnosis not present

## 2023-11-26 LAB — CUP PACEART REMOTE DEVICE CHECK
Battery Remaining Longevity: 134 mo
Battery Voltage: 3.01 V
Brady Statistic AP VP Percent: 0.03 %
Brady Statistic AP VS Percent: 69.44 %
Brady Statistic AS VP Percent: 0.01 %
Brady Statistic AS VS Percent: 30.52 %
Brady Statistic RA Percent Paced: 69.61 %
Brady Statistic RV Percent Paced: 0.04 %
Date Time Interrogation Session: 20251016101449
Implantable Lead Connection Status: 753985
Implantable Lead Connection Status: 753985
Implantable Lead Implant Date: 20120501
Implantable Lead Implant Date: 20120501
Implantable Lead Location: 753859
Implantable Lead Location: 753860
Implantable Pulse Generator Implant Date: 20230109
Lead Channel Impedance Value: 342 Ohm
Lead Channel Impedance Value: 380 Ohm
Lead Channel Impedance Value: 475 Ohm
Lead Channel Impedance Value: 494 Ohm
Lead Channel Pacing Threshold Amplitude: 0.625 V
Lead Channel Pacing Threshold Amplitude: 0.875 V
Lead Channel Pacing Threshold Pulse Width: 0.4 ms
Lead Channel Pacing Threshold Pulse Width: 0.4 ms
Lead Channel Sensing Intrinsic Amplitude: 14.5 mV
Lead Channel Sensing Intrinsic Amplitude: 14.5 mV
Lead Channel Sensing Intrinsic Amplitude: 2.125 mV
Lead Channel Sensing Intrinsic Amplitude: 2.125 mV
Lead Channel Setting Pacing Amplitude: 1.5 V
Lead Channel Setting Pacing Amplitude: 2 V
Lead Channel Setting Pacing Pulse Width: 0.4 ms
Lead Channel Setting Sensing Sensitivity: 0.9 mV
Zone Setting Status: 755011
Zone Setting Status: 755011

## 2023-11-29 ENCOUNTER — Ambulatory Visit: Payer: Self-pay | Admitting: Cardiovascular Disease

## 2023-11-30 NOTE — Progress Notes (Signed)
 Remote PPM Transmission

## 2023-12-25 DIAGNOSIS — L817 Pigmented purpuric dermatosis: Secondary | ICD-10-CM | POA: Diagnosis not present

## 2024-02-16 ENCOUNTER — Telehealth: Payer: Self-pay | Admitting: Cardiovascular Disease

## 2024-02-16 NOTE — Telephone Encounter (Signed)
 Please add onto the end of the schedule Monday, March 16

## 2024-02-16 NOTE — Telephone Encounter (Signed)
 Wife called to say that the patient will be losing his insurance on 06/06/24, they would like to get him in before then to have his pacer check. Dr. JAYSON didn't have anyting till 06/13/24. Please advise

## 2024-02-20 NOTE — Telephone Encounter (Signed)
 Appointment made for patient 1/12. Closing encounter.

## 2024-02-22 ENCOUNTER — Ambulatory Visit: Payer: BC Managed Care – PPO

## 2024-02-22 DIAGNOSIS — R001 Bradycardia, unspecified: Secondary | ICD-10-CM | POA: Diagnosis not present

## 2024-02-22 LAB — CUP PACEART REMOTE DEVICE CHECK
Battery Remaining Longevity: 131 mo
Battery Voltage: 3.02 V
Brady Statistic AP VP Percent: 0.03 %
Brady Statistic AP VS Percent: 65.54 %
Brady Statistic AS VP Percent: 0.02 %
Brady Statistic AS VS Percent: 34.42 %
Brady Statistic RA Percent Paced: 65.75 %
Brady Statistic RV Percent Paced: 0.04 %
Date Time Interrogation Session: 20260115111641
Implantable Lead Connection Status: 753985
Implantable Lead Connection Status: 753985
Implantable Lead Implant Date: 20120501
Implantable Lead Implant Date: 20120501
Implantable Lead Location: 753859
Implantable Lead Location: 753860
Implantable Pulse Generator Implant Date: 20230109
Lead Channel Impedance Value: 342 Ohm
Lead Channel Impedance Value: 380 Ohm
Lead Channel Impedance Value: 456 Ohm
Lead Channel Impedance Value: 494 Ohm
Lead Channel Pacing Threshold Amplitude: 0.625 V
Lead Channel Pacing Threshold Amplitude: 0.625 V
Lead Channel Pacing Threshold Pulse Width: 0.4 ms
Lead Channel Pacing Threshold Pulse Width: 0.4 ms
Lead Channel Sensing Intrinsic Amplitude: 16.125 mV
Lead Channel Sensing Intrinsic Amplitude: 16.125 mV
Lead Channel Sensing Intrinsic Amplitude: 2.125 mV
Lead Channel Sensing Intrinsic Amplitude: 2.125 mV
Lead Channel Setting Pacing Amplitude: 1.5 V
Lead Channel Setting Pacing Amplitude: 2 V
Lead Channel Setting Pacing Pulse Width: 0.4 ms
Lead Channel Setting Sensing Sensitivity: 0.9 mV
Zone Setting Status: 755011
Zone Setting Status: 755011

## 2024-02-23 ENCOUNTER — Telehealth: Payer: Self-pay | Admitting: Cardiovascular Disease

## 2024-02-23 DIAGNOSIS — E782 Mixed hyperlipidemia: Secondary | ICD-10-CM

## 2024-02-23 DIAGNOSIS — Z79899 Other long term (current) drug therapy: Secondary | ICD-10-CM

## 2024-02-23 NOTE — Telephone Encounter (Signed)
 Pt wife calling regarding lab orders  be done prior to his appt on  3/16 . Please advise

## 2024-02-23 NOTE — Telephone Encounter (Signed)
Lipid panel and CMET please

## 2024-02-24 ENCOUNTER — Ambulatory Visit: Payer: Self-pay | Admitting: Cardiovascular Disease

## 2024-02-26 NOTE — Telephone Encounter (Signed)
 [facility] message sent and lab work ordered

## 2024-02-26 NOTE — Addendum Note (Signed)
 Addended by: DAVEE IZETTA CROME on: 02/26/2024 11:54 AM   Modules accepted: Orders

## 2024-03-01 NOTE — Progress Notes (Signed)
 Remote PPM Transmission

## 2024-04-22 ENCOUNTER — Ambulatory Visit: Admitting: Cardiovascular Disease

## 2024-05-23 ENCOUNTER — Ambulatory Visit

## 2024-08-22 ENCOUNTER — Ambulatory Visit

## 2024-11-21 ENCOUNTER — Ambulatory Visit

## 2025-02-20 ENCOUNTER — Ambulatory Visit
